# Patient Record
Sex: Male | Born: 1970 | Hispanic: Refuse to answer | Marital: Married
Health system: Midwestern US, Community
[De-identification: ages and names within clinical notes are randomized; demographics above are authoritative.]

## PROBLEM LIST (undated history)

## (undated) DIAGNOSIS — K921 Melena: Secondary | ICD-10-CM

## (undated) DIAGNOSIS — M1990 Unspecified osteoarthritis, unspecified site: Secondary | ICD-10-CM

## (undated) DIAGNOSIS — G8929 Other chronic pain: Secondary | ICD-10-CM

## (undated) DIAGNOSIS — M25572 Pain in left ankle and joints of left foot: Secondary | ICD-10-CM

## (undated) DIAGNOSIS — S134XXA Sprain of ligaments of cervical spine, initial encounter: Secondary | ICD-10-CM

## (undated) DIAGNOSIS — F17200 Nicotine dependence, unspecified, uncomplicated: Secondary | ICD-10-CM

## (undated) DIAGNOSIS — M19072 Primary osteoarthritis, left ankle and foot: Secondary | ICD-10-CM

## (undated) DIAGNOSIS — Z5181 Encounter for therapeutic drug level monitoring: Secondary | ICD-10-CM

## (undated) DIAGNOSIS — F17208 Nicotine dependence, unspecified, with other nicotine-induced disorders: Secondary | ICD-10-CM

## (undated) DIAGNOSIS — Z4789 Encounter for other orthopedic aftercare: Secondary | ICD-10-CM

## (undated) HISTORY — DX: Unspecified osteoarthritis, unspecified site: M19.90

## (undated) HISTORY — DX: Melena: K92.1

---

## 2005-03-04 ENCOUNTER — Encounter: Payer: Self-pay | Admitting: Family Medicine

## 2005-06-15 ENCOUNTER — Encounter: Admission: RE | Admit: 2005-06-15 | Discharge: 2005-06-15 | Payer: Self-pay | Admitting: Rheumatology

## 2005-06-18 ENCOUNTER — Emergency Department (HOSPITAL_COMMUNITY): Admission: EM | Admit: 2005-06-18 | Discharge: 2005-06-18 | Payer: Self-pay | Admitting: *Deleted

## 2006-08-08 ENCOUNTER — Emergency Department (HOSPITAL_COMMUNITY): Admission: EM | Admit: 2006-08-08 | Discharge: 2006-08-09 | Payer: Self-pay | Admitting: Emergency Medicine

## 2007-01-27 ENCOUNTER — Ambulatory Visit: Payer: Self-pay | Admitting: Family Medicine

## 2007-03-22 ENCOUNTER — Ambulatory Visit: Payer: Self-pay | Admitting: Family Medicine

## 2007-03-22 DIAGNOSIS — M67919 Unspecified disorder of synovium and tendon, unspecified shoulder: Secondary | ICD-10-CM | POA: Insufficient documentation

## 2007-03-22 DIAGNOSIS — M719 Bursopathy, unspecified: Secondary | ICD-10-CM

## 2007-04-17 ENCOUNTER — Emergency Department (HOSPITAL_COMMUNITY): Admission: EM | Admit: 2007-04-17 | Discharge: 2007-04-17 | Payer: Self-pay | Admitting: Family Medicine

## 2007-04-19 ENCOUNTER — Ambulatory Visit: Payer: Self-pay | Admitting: Family Medicine

## 2007-04-19 DIAGNOSIS — H6120 Impacted cerumen, unspecified ear: Secondary | ICD-10-CM | POA: Insufficient documentation

## 2007-04-19 DIAGNOSIS — J020 Streptococcal pharyngitis: Secondary | ICD-10-CM | POA: Insufficient documentation

## 2007-06-06 ENCOUNTER — Telehealth: Payer: Self-pay | Admitting: Family Medicine

## 2007-08-22 ENCOUNTER — Encounter: Payer: Self-pay | Admitting: Family Medicine

## 2007-09-26 ENCOUNTER — Telehealth: Payer: Self-pay | Admitting: Family Medicine

## 2007-10-03 ENCOUNTER — Encounter: Payer: Self-pay | Admitting: Family Medicine

## 2007-10-22 ENCOUNTER — Encounter: Payer: Self-pay | Admitting: Family Medicine

## 2007-10-22 ENCOUNTER — Encounter: Payer: Self-pay | Admitting: Emergency Medicine

## 2007-10-22 ENCOUNTER — Inpatient Hospital Stay (HOSPITAL_COMMUNITY): Admission: EM | Admit: 2007-10-22 | Discharge: 2007-10-26 | Payer: Self-pay | Admitting: Emergency Medicine

## 2007-10-30 ENCOUNTER — Ambulatory Visit: Payer: Self-pay | Admitting: Family Medicine

## 2007-10-30 DIAGNOSIS — A879 Viral meningitis, unspecified: Secondary | ICD-10-CM | POA: Insufficient documentation

## 2007-10-30 DIAGNOSIS — D72829 Elevated white blood cell count, unspecified: Secondary | ICD-10-CM | POA: Insufficient documentation

## 2007-10-30 DIAGNOSIS — R740 Nonspecific elevation of levels of transaminase and lactic acid dehydrogenase [LDH]: Secondary | ICD-10-CM

## 2007-11-01 LAB — CONVERTED CEMR LAB
Alkaline Phosphatase: 59 units/L (ref 39–117)
Basophils Relative: 3 % (ref 0.0–3.0)
Bilirubin, Direct: 0.2 mg/dL (ref 0.0–0.3)
Eosinophils Relative: 1.6 % (ref 0.0–5.0)
Monocytes Absolute: 0.4 10*3/uL (ref 0.1–1.0)
RBC: 4.93 M/uL (ref 4.22–5.81)
Total Bilirubin: 1.2 mg/dL (ref 0.3–1.2)
Total Protein: 7.6 g/dL (ref 6.0–8.3)
WBC: 5.4 10*3/uL (ref 4.5–10.5)

## 2008-05-30 ENCOUNTER — Ambulatory Visit: Payer: Self-pay | Admitting: Family Medicine

## 2008-05-30 DIAGNOSIS — B350 Tinea barbae and tinea capitis: Secondary | ICD-10-CM

## 2008-08-28 ENCOUNTER — Telehealth: Payer: Self-pay | Admitting: Family Medicine

## 2008-09-10 ENCOUNTER — Encounter: Payer: Self-pay | Admitting: Family Medicine

## 2008-10-03 ENCOUNTER — Encounter: Payer: Self-pay | Admitting: Family Medicine

## 2008-11-25 ENCOUNTER — Ambulatory Visit: Payer: Self-pay | Admitting: Family Medicine

## 2008-11-25 DIAGNOSIS — R5383 Other fatigue: Secondary | ICD-10-CM

## 2008-11-25 DIAGNOSIS — R5381 Other malaise: Secondary | ICD-10-CM

## 2008-11-26 LAB — CONVERTED CEMR LAB
ALT: 75 units/L — ABNORMAL HIGH (ref 0–53)
BUN: 11 mg/dL (ref 6–23)
Basophils Relative: 0.7 % (ref 0.0–3.0)
Bilirubin, Direct: 0 mg/dL (ref 0.0–0.3)
CO2: 29 meq/L (ref 19–32)
Calcium: 8.6 mg/dL (ref 8.4–10.5)
Direct LDL: 140.4 mg/dL
Eosinophils Absolute: 0 10*3/uL (ref 0.0–0.7)
GFR calc non Af Amer: 87.82 mL/min (ref >60)
Hemoglobin: 13.2 g/dL (ref 13.0–17.0)
Lymphocytes Relative: 28.6 % (ref 12.0–46.0)
Lymphs Abs: 1.3 10*3/uL (ref 0.7–4.0)
MCHC: 33 g/dL (ref 30.0–36.0)
MCV: 85.2 fL (ref 78.0–100.0)
Monocytes Absolute: 0.5 10*3/uL (ref 0.1–1.0)
Monocytes Relative: 10.6 % (ref 3.0–12.0)
Neutrophils Relative %: 59.3 % (ref 43.0–77.0)
Platelets: 210 10*3/uL (ref 150.0–400.0)
RDW: 14.1 % (ref 11.5–14.6)
Sodium: 140 meq/L (ref 135–145)
Total Bilirubin: 0.7 mg/dL (ref 0.3–1.2)
Total CHOL/HDL Ratio: 4
WBC: 4.4 10*3/uL — ABNORMAL LOW (ref 4.5–10.5)

## 2008-12-17 ENCOUNTER — Ambulatory Visit: Payer: Self-pay | Admitting: Family Medicine

## 2009-01-15 ENCOUNTER — Encounter: Payer: Self-pay | Admitting: Family Medicine

## 2009-01-15 ENCOUNTER — Encounter: Admission: RE | Admit: 2009-01-15 | Discharge: 2009-01-15 | Payer: Self-pay | Admitting: Family Medicine

## 2009-04-14 ENCOUNTER — Emergency Department (HOSPITAL_COMMUNITY): Admission: EM | Admit: 2009-04-14 | Discharge: 2009-04-15 | Payer: Self-pay | Admitting: Emergency Medicine

## 2009-04-15 ENCOUNTER — Emergency Department (HOSPITAL_COMMUNITY): Admission: EM | Admit: 2009-04-15 | Discharge: 2009-04-15 | Payer: Self-pay | Admitting: Emergency Medicine

## 2009-10-17 IMAGING — RF DG FLUORO GUIDE NDL PLC/BX
1 series · 1 of 1 positions shown · non-contrast
Comparison: none

CLINICAL DATA: Headache.

DIAGNOSTIC LUMBAR PUNCTURE UNDER FLUOROSCOPIC GUIDANCE
TECHNIQUE: Informed consent was obtained from the patient prior to
the procedure, including potential complications of headache,
allergy, and pain.   With the patient prone, the lower back was
prepped with Betadine.  1% Lidocaine was used for local anesthesia.
Lumbar puncture was performed at the L4-5 level using a 20 gauge
needle with return of clear CSF with an opening pressure of 19 cm
water.   6-8 cc of CSF were obtained for laboratory studies.  The
patient tolerated the procedure well and there were no apparent
complications.

[Series 1: run · 1 of 1 slices shown]
[im 1/1]
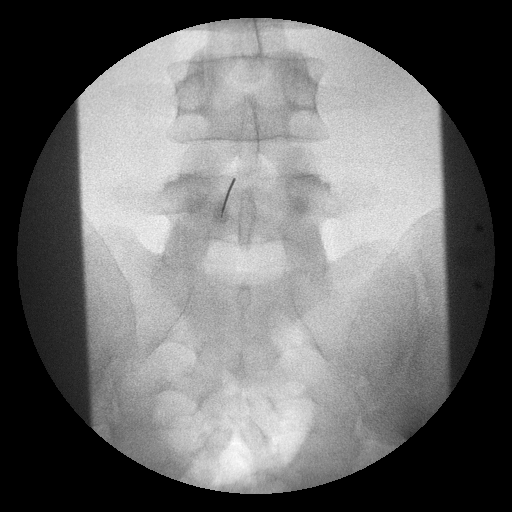

[1 of 1 positions shown; findings below may reference images not displayed]

IMPRESSION: Successful lumbar puncture under fluoroscopic guidance as above.

## 2009-10-17 IMAGING — CT CT HEAD W/O CM
1 series · 16 of 30 positions shown, 20 images · non-contrast
Comparison: None

CLINICAL DATA: Headache.

CT HEAD WITHOUT CONTRAST
TECHNIQUE: Contiguous axial images were obtained from the base of
the skull through the vertex without contrast.

[Series 2: headseq 4.8 h45s · axial · 0.43mm/px · z∈[-198,-46]mm · 16 of 36 slices shown, 20 images]
[im 2/36  brain]
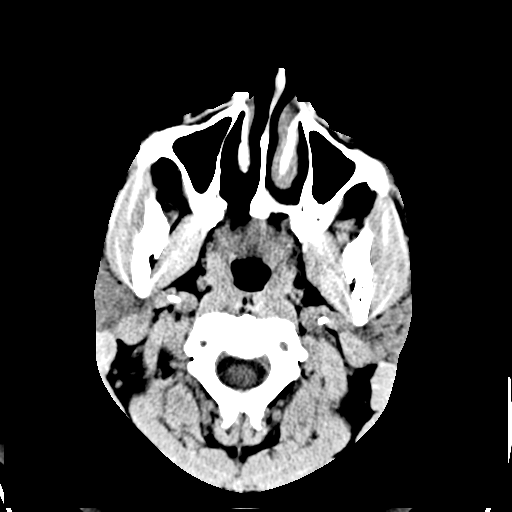
[im 2/36  bone]
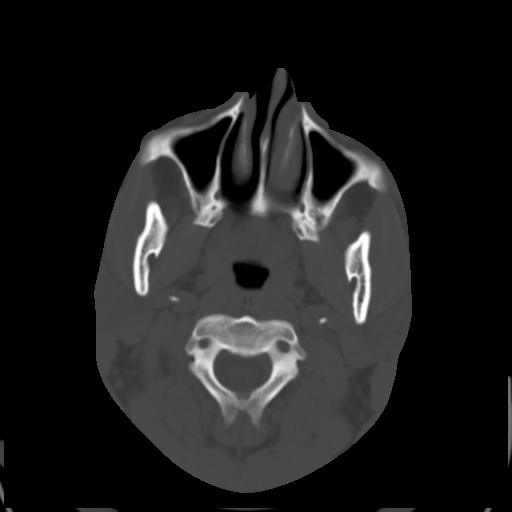
[im 4/36  brain]
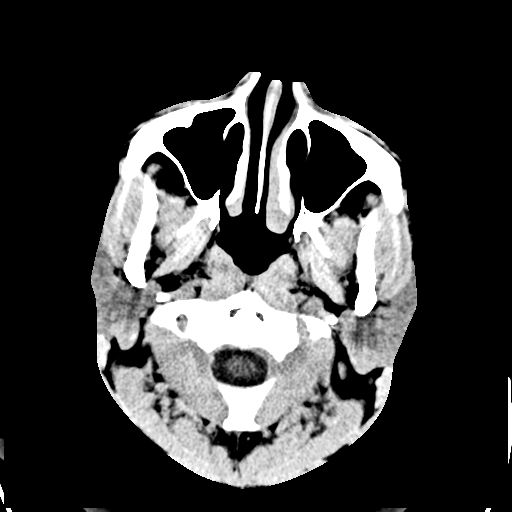
[im 7/36  brain]
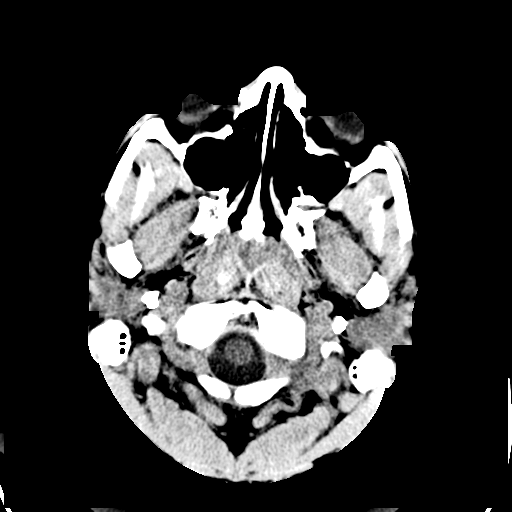
[im 9/36  brain]
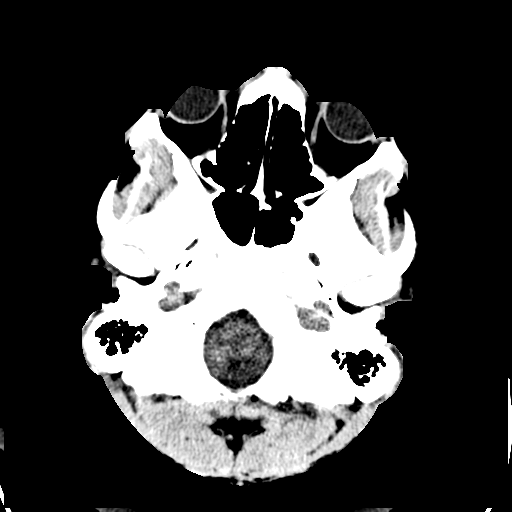
[im 10/36  brain]
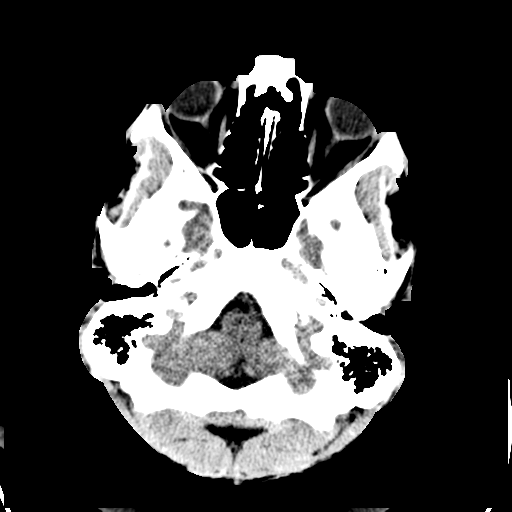
[im 10/36  bone]
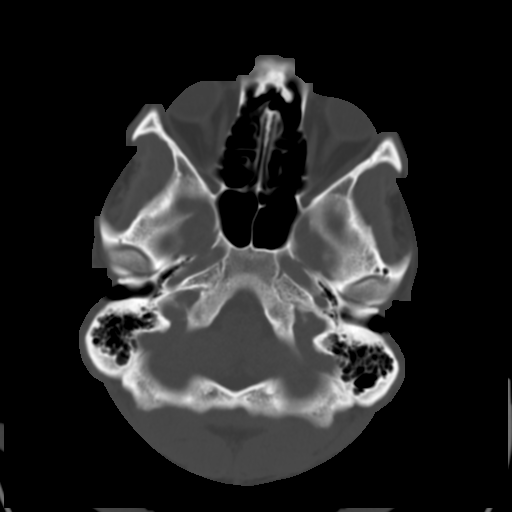
[im 13/36  brain]
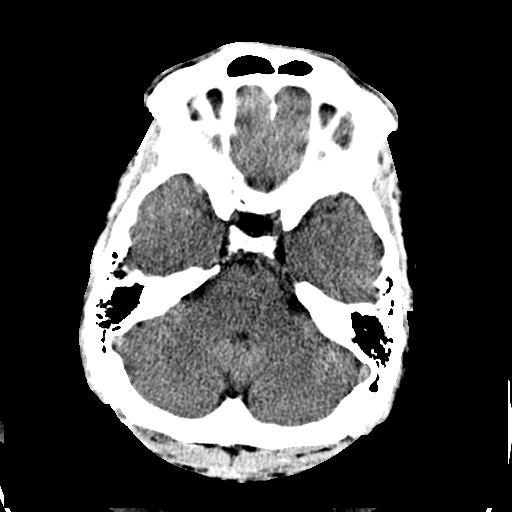
[im 15/36  brain]
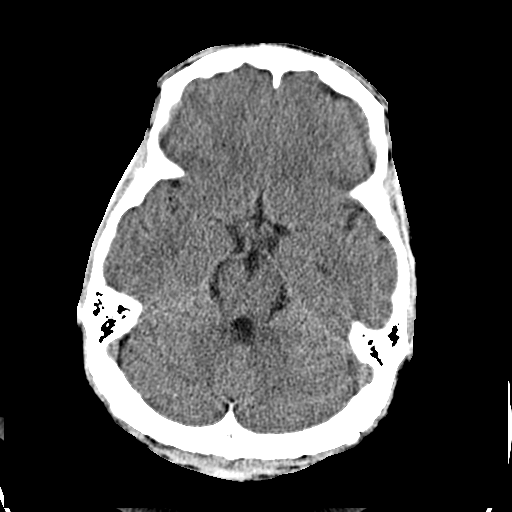
[im 17/36  brain]
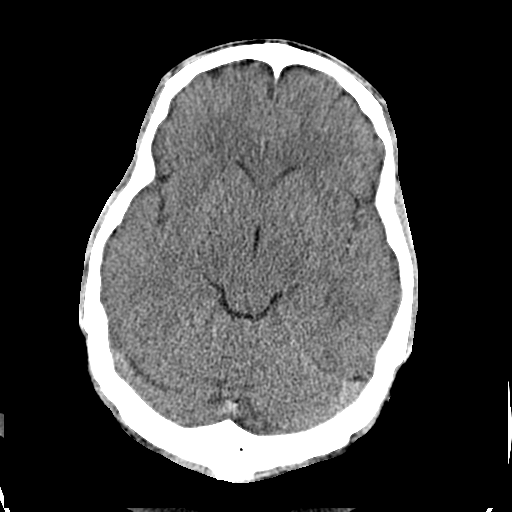
[im 19/36  brain]
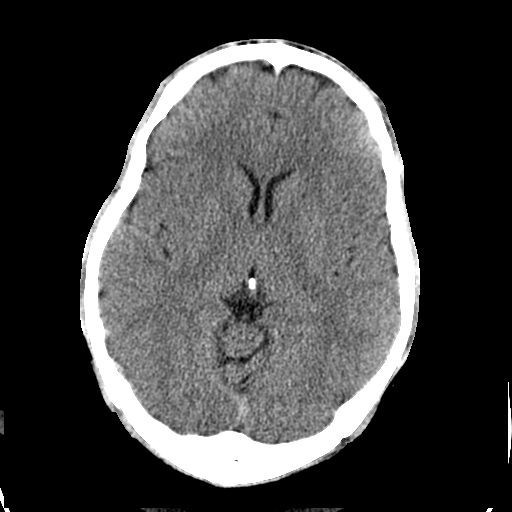
[im 19/36  bone]
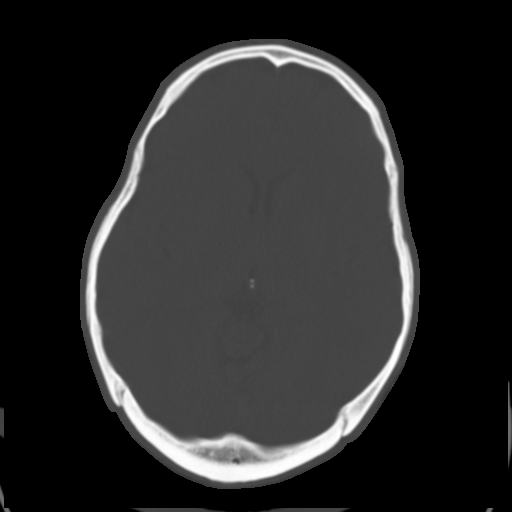
[im 21/36  brain]
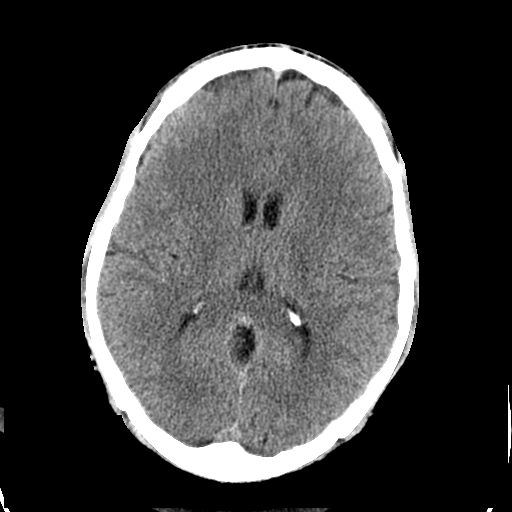
[im 23/36  brain]
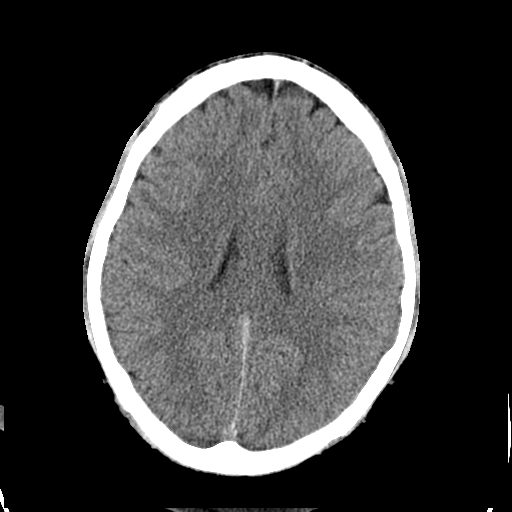
[im 26/36  brain]
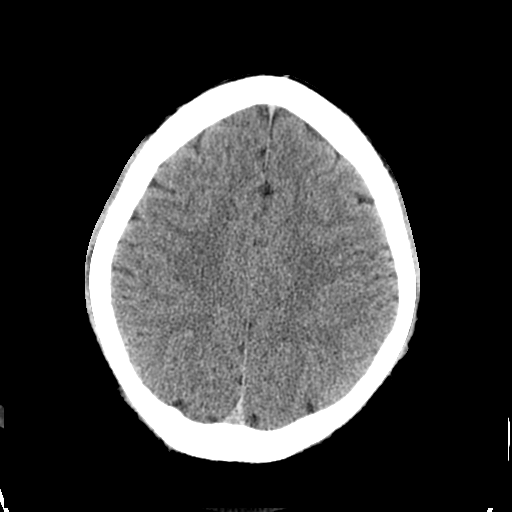
[im 27/36  brain]
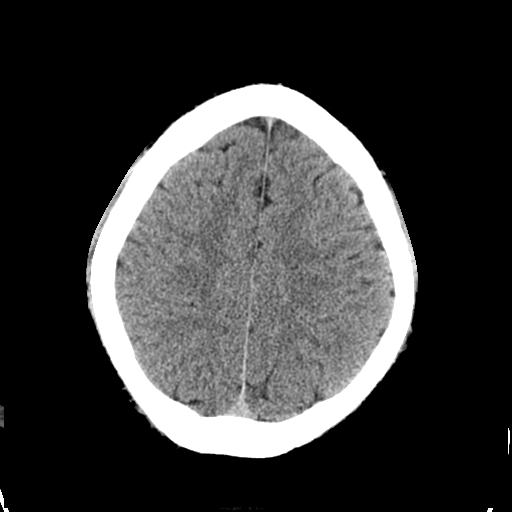
[im 27/36  bone]
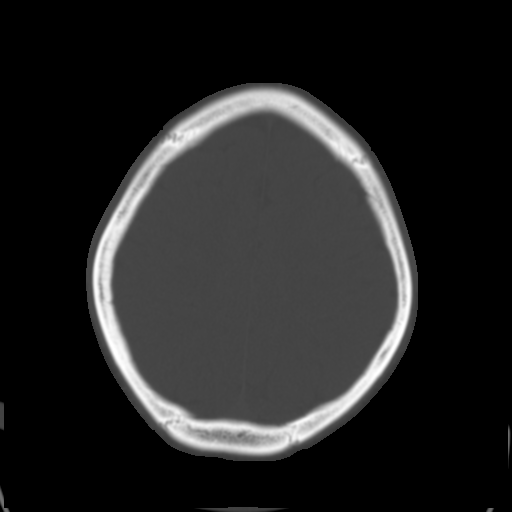
[im 29/36  brain]
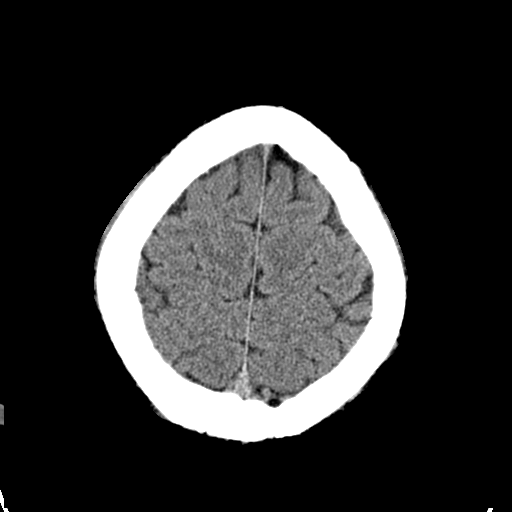
[im 32/36  brain]
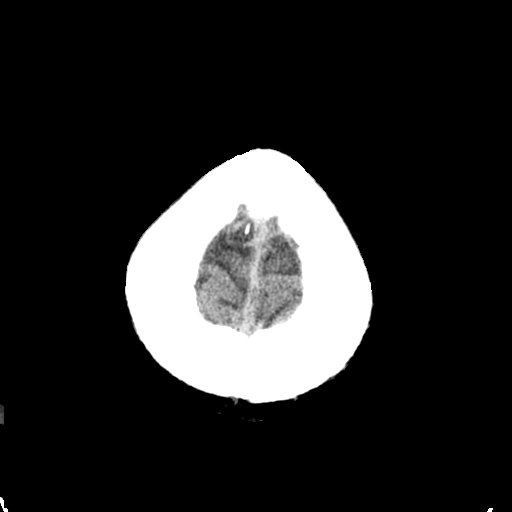
[im 34/36  brain]
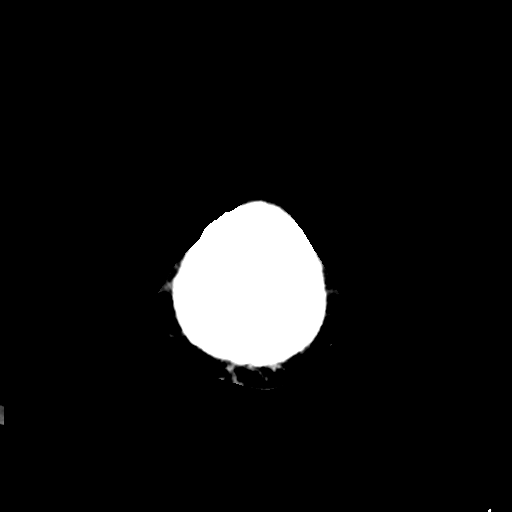

[16 of 30 positions shown; findings below may reference images not displayed]

FINDINGS: No acute intracranial abnormality.  Specifically, no
hemorrhage, hydrocephalus, mass lesion, acute infarction, or
significant intracranial injury.  No acute calvarial abnormality.
IMPRESSION: No acute intracranial findings.

## 2010-03-08 ENCOUNTER — Encounter: Payer: Self-pay | Admitting: Rheumatology

## 2010-06-30 NOTE — H&P (Signed)
Joseph Becker, Joseph Becker                ACCOUNT NO.:  [id number]   MEDICAL RECORD NO.:  [id number]          PATIENT TYPE:  OUT   LOCATION:  [facility]                         FACILITY:  [facility]   PHYSICIAN:  Beckey Rutter, MD  DATE OF BIRTH:  [birth year]   DATE OF ADMISSION:  10/22/2007  DATE OF DISCHARGE:                              HISTORY & PHYSICAL   PRIMARY CARE PHYSICIAN:  This patient is unassigned to [facility].   CHIEF COMPLAINT:  Headache, photophobia.   HISTORY OF PRESENT ILLNESS:  This is a 40 year old African American  gentleman with past medical history significant for tobacco abuse.  Presented yesterday with headache.  Severe headache that started the  night before his presentation.  The headache was associated with chills  but no fever recorded.  The headache was also associated with  photophobia.  The headache was progressively worsening, which brought  the patient to the emergency department.  Currently the headache is  relieved by pain medication in the emergency department.  The patient  denied any sick contact or nausea or vomiting.   The patient was also complaining of reddish stool and some abdominal  discomfort on presentation.  He personally felt that this is secondary  to [employer] he ate outside in the restaurant on the night prior  to his presentation.  He stated that his 38-year-old daughter has similar  symptoms and his son was complaining of some flu-like symptoms a few  days ago.   REVIEW OF SYSTEMS:  Positive for chills, blood per rectum, and abdominal  discomfort.  No vomiting.   FAMILY HISTORY:  Is significant for diabetes in the mother's side.   SOCIAL HISTORY:  The patient is a smoker about 1 back per day for the  last 10 years.  He is also drinking 2 beers every night.  He denies he  denies drug abuse.   MEDICATION ALLERGIES:  Not known to have medication allergy.   MEDICATIONS:  None.   EXAMINATION:  VITAL SIGNS:  His temperature on  presentation is 100.0,  pulse 101, respiratory rate is 18.  The last documented temperature is  98.3 with pulse 87, respiratory rate is 20.  GI:  He was lying flat on bed covering his eyes with lights in the room.  HEAD:  Atraumatic, normocephalic.  EYES:  The patient has bulging eyes/exophthalmus bilaterally, which he  said is new to him.  His pupils are equal, reactive to light  bilaterally.  NECK:  He has positive neck rigidity.  BACK:  He has a straight back with no significant area of tenderness.  LUNGS:  Bilateral fair air entry.  No adventitious sounds.  PRECORDIAL EXAMINATION:  First and second heart sound audible.  No added  sounds.  ABDOMEN:  Soft, nontender.  Bowel sounds present.  EXTREMITIES:  No lower extremity edema.  The patient has slight pain  with straight leg raising sign.  NEUROLOGICALLY:  He is alert, orientated, giving history.  MOUTH:  No evidence of ulcer.   LABORATORY STUDIES:  CT head without contrast done on the October 22, 2007 today  showing no acute intracranial abnormalities.  The patient had  undergone LP to rule out meningitis.  Glucose is 60 with the CSF protein  86.  Gram stain is negative for organism with few white blood count  present, predominantly mononuclear.  The cell count and differential  were showing white blood count of 66, RBCs 38, lymphocytes 83, with  segmented neutrophils for the CSF.  Color is colorless and it is clear.  Lipase in the blood is 21, occult blood is negative.  Urinalysis is  essentially negative with clear urine.  Sodium is 137, potassium 3.8,  chloride 105, bicarb 24, glucose 118, BUN 17, creatinine 1.1.  White  blood count is 6.2, hemoglobin is 15, hematocrit 45.8 and platelet count  is 205,000.   ASSESSMENT/PLAN:  This is a 40 year old gentleman with viral meningitis.   PLAN:  1. The patient will be admitted to the hospital for further assessment      and management.  2. The patient will have supportive care  and pain control measures.  3. Will start the patient on acyclovir and we will send for herpes      type 1 and 2 and adenovirus pending the infectious disease      consultation.  4. The patient will be on bed rest since he is status post LP.  5. For DVT prophylaxis will start Lovenox.  6. For GI prophylaxis will consider Protonix.      Beckey Rutter, MD  Electronically Signed     EME/MEDQ  D:  10/22/2007  T:  10/22/2007  Job:  [id number]

## 2010-06-30 NOTE — Assessment & Plan Note (Signed)
[facility]                                 ON-CALL NOTE   NAME:LEESiddhartha, Hoback                         MRN:          [id number]  DATE:03/22/2007                            DOB:          1970-04-22    Kerby Nora, M.D. patient.   PHONE NUMBER:  [phone]   The patient was just put on Celebrex for joint pain and is having nausea  and diarrhea, wonders if it could be related to the medication after the  first day.  I told him it is not clear if that is it or not, but I would  stop the medicine and call the office for further direction whether he  should re-challenge or not, or other advise.     Neta Mends. Panosh, MD  Electronically Signed    WKP/MedQ  DD: 03/22/2007  DT: 03/23/2007  Job #: [id number]

## 2010-06-30 NOTE — Discharge Summary (Signed)
Joseph Becker, Joseph Becker                ACCOUNT NO.:  [id number]   MEDICAL RECORD NO.:  [id number]          PATIENT TYPE:  OUT   LOCATION:  [facility]                         FACILITY:  [facility]   PHYSICIAN:  Elliot Cousin, M.D.    DATE OF BIRTH:  1970/06/11   DATE OF ADMISSION:  10/22/2007  DATE OF DISCHARGE:  10/22/2007                               DISCHARGE SUMMARY   PRIMARY CARE PHYSICIAN:  Dr. Waymon [facility]   DISCHARGE DIAGNOSES:  1. Aseptic meningitis.  2. Hepatic transaminitis.  3. Mild leukopenia thought to be secondary to acyclovir.   DISCHARGE MEDICATIONS:  1. Vicodin 5 mg every 6 hours as needed for pain.  2. Tramadol one tablet every 4-6 hours as needed for pain (do not take      with Vicodin).   DISCHARGE DISPOSITION:  The patient is being discharged to home in  improved and stable condition.  The plan is for him to follow up with  Dr. Daron Becker  in 4-5 days.   CONSULTATIONS:  A curbside consultation with infectious diseases  physician Dr. Maurice March.   PROCEDURE PERFORMED:  1. Fluoroscopic guided lumbar puncture on October 22, 2007.  2. CT scan of the head on October 22, 2007.  The results revealed no      acute intracranial findings.   HISTORY OF PRESENT ILLNESS:  The patient is a 40 year old man with no  significant past medical history, who presented to the emergency  department on October 22, 2007, with a chief complaint of headache and  photophobia.  When the patient was evaluated in the emergency  department, a CT scan of the head was performed, and it revealed no  acute abnormalities.  A lumbar puncture was performed as well and the  results were as follows:  CSF glucose 60, CSF protein 86 (15-45 within  normal limits), CSF Gram stain with no organisms seen and few WBCs  present, CSF white blood cell count 78, RBC count 22, segmented  neutrophils 3%, lymphocytes 86%, and monocytes 11%.  The patient was  admitted for further evaluation  and management.   For additional details, please see the dictated history and physical.   HOSPITAL COURSE:  1. ASEPTIC MENINGITIS.  Blood cultures were ordered as well as a      culture of the CSF.  Herpes serology for type 1 and type 2 were      ordered as well.  The patient was started on empiric treatment with      intravenous acyclovir at 10 mg/kg every 8 hours.  The patient was      treated symptomatically with as-needed Tylenol, oxycodone and      Dilaudid.  At the time of the initial hospital assessment, the      patient was febrile with a temperature of 100.0.  During the      hospitalization, he became and remained afebrile.  He is currently      symptomatically improved with less photophobia and a less intense      headache.  He has been neurologically intact during  the entire      hospitalization.  There have been no signs or symptoms consistent      with encephalitis.  His blood cultures have remained negative.  His      CSF culture has been negative as well.  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  Yesterday, a CSF specimen for HSV-1 and HSV-      2 testing was ordered.  However, the results are pending.  I      discussed  further treatment with infectious diseases physician Dr.      Maurice March via a telephone conversation.  Given that the patient is now      symptomatically improved, and there is no objective evidence of a      CNS HSV infection, Dr. Maurice March recommended discontinuing the      acyclovir.  The patient did receive 4 days of intravenous      acyclovir.  He was advised to follow up with his primary care      physician next week.  He was also advised to come back to the      hospital if his symptoms worsened.  2. HEPATIC TRANSAMINITIS.  The patient's liver transaminases were      within normal limits at the time of the initial hospital      assessment.  He did complain of abdominal pain at the time of      admission which he attributed to food he ate at  a [employer].      During the hospitalization, however, he had no complaints of      abdominal pain.  His lipase was 21 and his stool heme negative.Marland Kitchen      His urinalysis revealed no WBCs.  As of today, his liver      transaminases have increased; his SGOT is 118 and his SGPT is 103.      On exam today, the patient has no abdominal tenderness.  As      previously stated, his liver transaminases were within normal      limits until today's lab results.  The etiology of the      transaminitis is unclear.  Perhaps, it is a manifestation of      acyclovir's  effects on the liver.  However, this cannot be      confirmed.  The patient was notified of the findings and was      advised to ask his primary care physician to follow up on the      abnormalities next week.  The patient voiced understanding.  3. MILD LEUKOPENIA.  The patient's white blood cell count was 6.2 at      the time of the initial hospital assessment.  Today, his WBC is      3.9.  More than likely, the leukopenia is secondary to acyclovir. I      advised the patient to have a follow-up CBC evaluated by his      primary care physician next week. All instructions were written and      given to the patient.      Elliot Cousin, M.D.  Electronically Signed     DF/MEDQ  D:  10/26/2007  T:  10/26/2007  Job:  [id number]   cc:   [facility] Dr. Daron Becker

## 2010-11-09 LAB — POCT RAPID STREP A: Streptococcus, Group A Screen (Direct): POSITIVE — AB

## 2010-11-18 LAB — CBC
Hemoglobin: 15
MCHC: 32.7
MCV: 83.5
RBC: 4.53
RBC: 4.53
RBC: 5.53
RDW: 15.3
WBC: 3.9 — ABNORMAL LOW
WBC: 6.2

## 2010-11-18 LAB — CULTURE, BLOOD (ROUTINE X 2): Culture: NO GROWTH

## 2010-11-18 LAB — COMPREHENSIVE METABOLIC PANEL
ALT: 103 — ABNORMAL HIGH
ALT: 48
AST: 118 — ABNORMAL HIGH
AST: 19
AST: 30
Albumin: 4.4
Alkaline Phosphatase: 51
BUN: 17
BUN: 8
CO2: 24
CO2: 26
CO2: 32
Calcium: 8.4
Calcium: 9.2
Chloride: 105
Creatinine, Ser: 1.29
GFR calc Af Amer: >60
GFR calc Af Amer: >60
GFR calc Af Amer: >60
GFR calc non Af Amer: >60
GFR calc non Af Amer: >60
Glucose, Bld: 118 — ABNORMAL HIGH
Glucose, Bld: 122 — ABNORMAL HIGH
Potassium: 4.1
Sodium: 140
Total Bilirubin: 0.9
Total Protein: 7.6

## 2010-11-18 LAB — CSF CELL COUNT WITH DIFFERENTIAL
Eosinophils, CSF: 0
Lymphs, CSF: 86 — ABNORMAL HIGH
Monocyte-Macrophage-Spinal Fluid: 11 — ABNORMAL LOW
Segmented Neutrophils-CSF: 3
Tube #: 4

## 2010-11-18 LAB — URINALYSIS, ROUTINE W REFLEX MICROSCOPIC
Hgb urine dipstick: NEGATIVE
Nitrite: NEGATIVE
Protein, ur: NEGATIVE
Urobilinogen, UA: 1

## 2010-11-18 LAB — MAGNESIUM: Magnesium: 2.1

## 2010-11-18 LAB — TSH: TSH: 1.27

## 2010-11-18 LAB — DIFFERENTIAL
Basophils Absolute: 0
Basophils Relative: 0
Eosinophils Absolute: 0
Lymphs Abs: 1
Monocytes Relative: 6

## 2010-11-18 LAB — LIPASE, BLOOD: Lipase: 21

## 2010-11-18 LAB — CSF CULTURE W GRAM STAIN

## 2010-11-18 LAB — PROTEIN, CSF: Total  Protein, CSF: 86 — ABNORMAL HIGH

## 2010-11-18 LAB — PHOSPHORUS: Phosphorus: 4.2

## 2013-05-28 ENCOUNTER — Telehealth: Payer: Self-pay

## 2013-05-28 NOTE — Telephone Encounter (Signed)
Pt request letter from Dr Ermalene Searing that when pt was seen 03/22/2007 and during 2010 the right shoulder pain was service connected. Pt request cb. I advised pt needed to be seen since so long ago and pt said he has been going to [place] and the [place] is the facility that needs the letter.Please advise.

## 2013-05-29 ENCOUNTER — Encounter: Payer: Self-pay | Admitting: Family Medicine

## 2013-05-29 DIAGNOSIS — Z0279 Encounter for issue of other medical certificate: Secondary | ICD-10-CM

## 2013-05-29 NOTE — Telephone Encounter (Signed)
I don't mind writing this letter if I have documented what he wants in the chart, but can we get his old chart with the note? Does not appear to be scanned in.

## 2013-05-29 NOTE — Telephone Encounter (Signed)
Spoke with Mr. Joseph Becker.  He states he needs a letter about his hand and shoulder pain that he was seen for back in 2009/2010.  He is trying to show service connection.  Advise we would work on a letter for him and call him when it is ready.

## 2013-05-29 NOTE — Telephone Encounter (Signed)
Let handed to Lupita Leash.

## 2013-05-30 NOTE — Telephone Encounter (Signed)
Joseph Becker notified letter is ready to be picked up at front desk.  Advised we are including records also to take with him to the [place].  Also notified that there is a $20 fee but they would just bill him for that.

## 2013-06-12 ENCOUNTER — Encounter: Payer: Self-pay | Admitting: Family Medicine

## 2013-06-12 ENCOUNTER — Ambulatory Visit (INDEPENDENT_AMBULATORY_CARE_PROVIDER_SITE_OTHER): Payer: Self-pay | Admitting: Family Medicine

## 2013-06-12 ENCOUNTER — Telehealth: Payer: Self-pay | Admitting: Family Medicine

## 2013-06-12 VITALS — BP 114/90 | HR 77 | Temp 98.1°F | Ht 66.77 in | Wt 182.8 lb

## 2013-06-12 DIAGNOSIS — F1010 Alcohol abuse, uncomplicated: Secondary | ICD-10-CM | POA: Insufficient documentation

## 2013-06-12 DIAGNOSIS — M25511 Pain in right shoulder: Secondary | ICD-10-CM | POA: Insufficient documentation

## 2013-06-12 DIAGNOSIS — M069 Rheumatoid arthritis, unspecified: Secondary | ICD-10-CM | POA: Insufficient documentation

## 2013-06-12 DIAGNOSIS — M25512 Pain in left shoulder: Secondary | ICD-10-CM

## 2013-06-12 DIAGNOSIS — M542 Cervicalgia: Secondary | ICD-10-CM

## 2013-06-12 DIAGNOSIS — M25519 Pain in unspecified shoulder: Secondary | ICD-10-CM

## 2013-06-12 DIAGNOSIS — I10 Essential (primary) hypertension: Secondary | ICD-10-CM

## 2013-06-12 NOTE — Progress Notes (Signed)
Pre visit review using our clinic review tool, if applicable. No additional management support is needed unless otherwise documented below in the visit note. 

## 2013-06-12 NOTE — Telephone Encounter (Signed)
Relevant patient education mailed to patient.  

## 2013-06-12 NOTE — Patient Instructions (Addendum)
Decrease alcohol use to no more than 2 drinks a day.  Consider quitting smoking.  I will complete form as I get records from [place].

## 2013-06-12 NOTE — Progress Notes (Signed)
Subjective:    Patient ID: Joseph Becker, male    DOB: 10-Oct-1970, 43 y.o.   MRN: [id number]  HPI 43 year old male presents to re-establish care here.  Last seen here in 2010.  He also is seen at [facility] clinic, started there in last 3 years.   He has rheumatoid arthritis. Rheum at [facility] is Dr. Josetta Huddle. Poor control on methotrexate and mobic.  Pain at night and insomnia from RA. B shoulder pain,  Knee, ankle, hands and neck pain.  He has intermittent swelling, no redness.  He was diagnosed with RA at [facility] in 2013.  Has had multiple X-ray and MRI of joints, negative per pt. No weakness or numbness. Unable to cut grass, make the bed, showering without pain. Pain with ambulating in left ankle.  Has had cortisone injections  in both shoulder ( no relief) 2 years ago.   He needs a form [employer]) complete to state that he has had problems going on for a long time.   Hypertension: Well controlled aon lisinopril HCTZ BP Readings from Last 3 Encounters:  06/12/13 114/90  11/25/08 130/80  05/30/08 120/80  Using medication without problems or lightheadedness: None Chest pain with exertion:None Edema:None Short of breath:None Average home BPs:not checking. Other issues:  Chol and DM screen in last year. Plans to have followed at [facility] at [facility].    Review of Systems  Constitutional: Positive for fatigue. Negative for fever and unexpected weight change.  HENT: Negative for congestion, ear pain, postnasal drip, rhinorrhea, sore throat and trouble swallowing.   Eyes: Negative for pain.  Respiratory: Negative for cough, shortness of breath and wheezing.   Cardiovascular: Negative for chest pain, palpitations and leg swelling.  Gastrointestinal: Negative for nausea, abdominal pain, diarrhea, constipation and blood in stool.  Genitourinary: Negative for dysuria, urgency, hematuria, discharge, penile swelling, scrotal swelling, difficulty urinating, penile pain and testicular pain.    Musculoskeletal: Positive for arthralgias, back pain, joint swelling, neck pain and neck stiffness. Negative for myalgias.  Skin: Negative for rash.  Neurological: Negative for syncope, weakness, light-headedness, numbness and headaches.  Psychiatric/Behavioral: Negative for behavioral problems and dysphoric mood. The patient is not nervous/anxious.        Objective:   Physical Exam  Constitutional: Vital signs are normal. He appears well-developed and well-nourished.  HENT:  Head: Normocephalic.  Right Ear: Hearing normal.  Left Ear: Hearing normal.  Nose: Nose normal.  Mouth/Throat: Oropharynx is clear and moist and mucous membranes are normal.  Neck: Trachea normal. Carotid bruit is not present. No mass and no thyromegaly present.  Cardiovascular: Normal rate, regular rhythm and normal pulses.  Exam reveals no gallop, no distant heart sounds and no friction rub.   No murmur heard. No peripheral edema  Pulmonary/Chest: Effort normal and breath sounds normal. No respiratory distress.  Musculoskeletal:       Right shoulder: He exhibits decreased range of motion, tenderness and bony tenderness.       Left shoulder: He exhibits decreased range of motion, tenderness, bony tenderness, swelling and deformity. He exhibits no effusion.       Left knee: He exhibits decreased range of motion. He exhibits normal meniscus and no MCL laxity. Tenderness found. Medial joint line and lateral joint line tenderness noted. No patellar tendon tenderness noted.       Right ankle: He exhibits decreased range of motion. Tenderness.       Left ankle: He exhibits decreased range of motion. He exhibits  no swelling. Tenderness.       Cervical back: He exhibits decreased range of motion. He exhibits no tenderness and no bony tenderness.       Right hand: He exhibits decreased range of motion, tenderness and swelling. Normal sensation noted. Decreased strength noted. He exhibits finger abduction and thumb/finger  opposition. He exhibits no wrist extension trouble.       Left hand: He exhibits decreased range of motion, tenderness and swelling. He exhibits no bony tenderness. Normal sensation noted. Decreased strength noted. He exhibits finger abduction and thumb/finger opposition. He exhibits no wrist extension trouble.  Skin: Skin is warm, dry and intact. No rash noted.  Psychiatric: He has a normal mood and affect. His speech is normal and behavior is normal. Thought content normal.          Assessment & Plan:  Total visit time 45 minutes, > 50% spent counseling and cordinating patients care.  Discussed joint issues. Form will be completed once [facility] information obtained and past imaging reviewed.   Discussed with pt neck/ shoulder and knee issues seem atypical for RA... He may benefit from referral to [facility], but his rheumatologist Carl Butner already have done or be considering.

## 2013-06-13 ENCOUNTER — Telehealth: Payer: Self-pay | Admitting: Family Medicine

## 2013-06-13 NOTE — Telephone Encounter (Signed)
Relevant patient education mailed to patient.  

## 2013-08-08 ENCOUNTER — Emergency Department (INDEPENDENT_AMBULATORY_CARE_PROVIDER_SITE_OTHER)
Admission: EM | Admit: 2013-08-08 | Discharge: 2013-08-08 | Disposition: A | Discharge status: Home / Self Care | Payer: Self-pay | Source: Home / Self Care | Attending: Emergency Medicine | Admitting: Emergency Medicine

## 2013-08-08 ENCOUNTER — Encounter (HOSPITAL_COMMUNITY): Payer: Self-pay | Admitting: Emergency Medicine

## 2013-08-08 ENCOUNTER — Emergency Department (INDEPENDENT_AMBULATORY_CARE_PROVIDER_SITE_OTHER): Payer: Self-pay

## 2013-08-08 DIAGNOSIS — M722 Plantar fascial fibromatosis: Secondary | ICD-10-CM

## 2013-08-08 MED ORDER — HYDROCODONE-ACETAMINOPHEN 5-325 MG PO TABS
ORAL_TABLET | ORAL | Status: AC
  Filled 2013-08-08: qty 2

## 2013-08-08 MED ORDER — HYDROCODONE-ACETAMINOPHEN 5-325 MG PO TABS
2.0000 | ORAL_TABLET | Freq: Once | ORAL | Status: AC
  Administered 2013-08-08: 2 via ORAL

## 2013-08-08 MED ORDER — HYDROCODONE-ACETAMINOPHEN 5-325 MG PO TABS: ORAL_TABLET | ORAL | Status: AC

## 2013-08-08 NOTE — ED Notes (Signed)
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

## 2013-08-08 NOTE — Discharge Instructions (Signed)
Plantar fasciitis is a tendonitis (inflammed tendon) of the the plantar fascia, the tendon on the bottom of the foot that supports the arch.  Often the pain is localized to the heel and can be worse first thing in the morning after getting up or after sitting for a long period of time and tends to improve as the day goes by, only to worsen later on in the afternoon or evening after you have been on your feet for a long time.  Following the program outlined below cures most cases.  If conservative measures like these don't work, corticosteroid injection, or referral to a podiatrist are other options. ° °· Wear well fitting, lace up shoes with good arch support.  Tennis or running shoes are the best.  Do not wear heels, flip-flops, scuffs, or any kind of shoe without adequate support.   °· Use a heel lift.  These can be purchased at a shoe store or pharmacy.  They come in various price ranges.  Some people find that an orthotic insert with arch support works better. °· Wear a night splint.  These can be purchased on line at www.[employer].com.  The product to look for is the "Freedom Dorsal Night Splint."  [employer] also sells other products for plantar fasciitis including heel lifts and orthotic inserts. °· Use of over the counter pain meds can be of help.  Tylenol (or acetaminophen) is the safest to use.  It often helps to take this regularly.  You can take up to 2 325 mg tablets 5 times daily, but it best to start out much lower that that, perhaps 2 325 mg tablets twice daily, then increase from there. People who are on the blood thinner warfarin have to be careful about taking high doses of Tylenol.  For people who are able to tolerate them, ibuprofen and naproxyn can also help with the pain.  You should discuss these agents with your physician before taking them.  People with chronic kidney disease, hypertension, peptic ulcer disease, and reflux can suffer adverse side effects. They should not be taken with warfarin.  The maximum dosage of ibuprofen is 800 mg 3 times daily with meals.  The maximum dosage of naprosyn is 2 and 1/2 tablets twice daily with food, but again, start out low and gradually increase the dose until adequate pain relief is achieved. Ibuprofen and naprosyn should always be taken with food. °· Ice massage is helpful.  The best way to apply this is to freeze a bottle of water, place in on a towel and roll your foot over the bottle to produce an ice massage effect. This can be done as often as every 2 to 3 hours, depending on symptoms.  At the very least, try to do after you have been on your feet for a long period of time and after doing the exercises outlined below. °· Do the exercises outlined below twice daily: ° ° ° ° ° ° ° ° ° ° ° ° ° ° ° ° ° ° °

## 2013-08-08 NOTE — ED Provider Notes (Signed)
Chief Complaint   Chief Complaint  Patient presents with  . Foot Pain    History of Present Illness   Joseph Becker is a 43 year old male who states his arch fell 10 years ago and had heel pain in his left heel. This nicely got better. Ever since yesterday he's had pain in the heel again with swelling. He denies any injury to the area. It hurts to walk and to move his foot. He denies any numbness or tingling.  Review of Systems   Other than as noted above, the patient denies any of the following symptoms: Systemic:  No fevers or chills. Musculoskeletal:  No joint pain or arthritis.  Neurological:  No muscular weakness, paresthesias.   PMFSH   Past medical history, family history, social history, meds, and allergies were reviewed.     Physical  Examination     Vital signs:  BP 112/66  Pulse 98  Temp(Src) 98.7 F (37.1 C) (Oral)  Resp 14  SpO2 99% Gen:  Alert and oriented times 3.  In no distress. 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  Otherwise, all joints had a full a ROM with no swelling, bruising or deformity.  No edema, pulses full. Extremities were warm and pink.  Capillary refill was brisk.  Skin:  Clear, warm and dry.  No rash. Neuro:  Alert and oriented times 3.  Muscle strength was normal.  Sensation was intact to light touch.    Radiology   Dg Foot Complete Left  08/08/2013   CLINICAL DATA:  Plantar heel pain for 2 days.  EXAM: LEFT FOOT - COMPLETE 3+ VIEW  COMPARISON:  None.  FINDINGS: There is a tiny plantar calcaneal spur seen only on the oblique view. 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 There are slight arthritic changes of the first metatarsal phalangeal joint and of the IP joint of the great toe. There is a small osteophyte at the tip of the medial malleolus and there is an old avulsion of the tip of the lateral malleolus.  IMPRESSION: No acute abnormalities. Flatfoot  deformity. Small plantar calcaneal spur. Degenerative changes as described.   Electronically Signed   By: Geanie Cooley M.D.   On: 08/08/2013 14:17   I reviewed the images independently and personally and concur with the radiologist's findings.  [facility] in [facility]   He was put in a Personal assistant. Given Norco 5/325 2 by mouth for pain.  Assessment   The encounter diagnosis was Plantar fasciitis.  Suggested arch supports, ice massage, rest and elevation, stretching exercises beginning in 3 days, and followup with podiatry next week.  Plan    1.  Meds:  The following meds were prescribed:   Discharge Medication List as of 08/08/2013  2:36 PM    START taking these medications   Details  HYDROcodone-acetaminophen (NORCO/VICODIN) 5-325 MG per tablet 1 to 2 tabs every 4 to 6 hours as needed for pain., Print        2.  Patient Education/Counseling:  The patient was given appropriate handouts, self care instructions, and instructed in symptomatic relief including rest and activity, elevation, application of ice and compression.    3.  Follow up:  The patient was told to follow up here if no better in 3 to 4 days, or sooner if becoming worse in any way, and given some red flag symptoms such as worsening pain or neurological  symptoms which would prompt immediate return.  Follow up here as necessary.       Reuben Likes, MD 08/08/13 [phone]

## 2014-11-15 ENCOUNTER — Emergency Department: Payer: Medicaid Other

## 2014-11-15 ENCOUNTER — Emergency Department
Admission: EM | Admit: 2014-11-15 | Discharge: 2014-11-15 | Disposition: A | Discharge status: Other Acute Inpatient Hospital | Payer: Medicaid Other | Attending: Emergency Medicine | Admitting: Emergency Medicine

## 2014-11-15 DIAGNOSIS — T447X2A Poisoning by beta-adrenoreceptor antagonists, intentional self-harm, initial encounter: Secondary | ICD-10-CM

## 2014-11-15 DIAGNOSIS — F32A Depression, unspecified: Secondary | ICD-10-CM

## 2014-11-15 DIAGNOSIS — Z791 Long term (current) use of non-steroidal anti-inflammatories (NSAID): Secondary | ICD-10-CM | POA: Diagnosis not present

## 2014-11-15 DIAGNOSIS — Y998 Other external cause status: Secondary | ICD-10-CM | POA: Insufficient documentation

## 2014-11-15 DIAGNOSIS — I10 Essential (primary) hypertension: Secondary | ICD-10-CM | POA: Diagnosis not present

## 2014-11-15 DIAGNOSIS — F331 Major depressive disorder, recurrent, moderate: Secondary | ICD-10-CM | POA: Diagnosis not present

## 2014-11-15 DIAGNOSIS — F1310 Sedative, hypnotic or anxiolytic abuse, uncomplicated: Secondary | ICD-10-CM | POA: Diagnosis not present

## 2014-11-15 DIAGNOSIS — Y9389 Activity, other specified: Secondary | ICD-10-CM | POA: Diagnosis not present

## 2014-11-15 DIAGNOSIS — Z79899 Other long term (current) drug therapy: Secondary | ICD-10-CM | POA: Insufficient documentation

## 2014-11-15 DIAGNOSIS — Z720 Tobacco use: Secondary | ICD-10-CM | POA: Insufficient documentation

## 2014-11-15 DIAGNOSIS — F329 Major depressive disorder, single episode, unspecified: Secondary | ICD-10-CM | POA: Insufficient documentation

## 2014-11-15 DIAGNOSIS — T43212A Poisoning by selective serotonin and norepinephrine reuptake inhibitors, intentional self-harm, initial encounter: Secondary | ICD-10-CM | POA: Diagnosis present

## 2014-11-15 DIAGNOSIS — F4310 Post-traumatic stress disorder, unspecified: Secondary | ICD-10-CM

## 2014-11-15 DIAGNOSIS — F1010 Alcohol abuse, uncomplicated: Secondary | ICD-10-CM | POA: Diagnosis present

## 2014-11-15 DIAGNOSIS — Y9289 Other specified places as the place of occurrence of the external cause: Secondary | ICD-10-CM | POA: Diagnosis not present

## 2014-11-15 LAB — CBC
HEMATOCRIT: 45.7 % (ref 40.0–52.0)
HEMOGLOBIN: 14.6 g/dL (ref 13.0–18.0)
MCH: 26.7 pg (ref 26.0–34.0)
MCHC: 32 g/dL (ref 32.0–36.0)
MCV: 83.3 fL (ref 80.0–100.0)
Platelets: 194 10*3/uL (ref 150–440)
RBC: 5.48 MIL/uL (ref 4.40–5.90)
RDW: 15.1 % — ABNORMAL HIGH (ref 11.5–14.5)
WBC: 4.9 10*3/uL (ref 3.8–10.6)

## 2014-11-15 LAB — COMPREHENSIVE METABOLIC PANEL
ALBUMIN: 4.7 g/dL (ref 3.5–5.0)
ALK PHOS: 64 U/L (ref 38–126)
ALT: 79 U/L — AB (ref 17–63)
AST: 51 U/L — AB (ref 15–41)
Anion gap: 9 (ref 5–15)
BUN: 19 mg/dL (ref 6–20)
CALCIUM: 8.8 mg/dL — AB (ref 8.9–10.3)
CHLORIDE: 103 mmol/L (ref 101–111)
CO2: 22 mmol/L (ref 22–32)
CREATININE: 1.08 mg/dL (ref 0.61–1.24)
GFR calc non Af Amer: >60 mL/min (ref >60)
GLUCOSE: 96 mg/dL (ref 65–99)
Potassium: 3.5 mmol/L (ref 3.5–5.1)
SODIUM: 134 mmol/L — AB (ref 135–145)
Total Bilirubin: 1.2 mg/dL (ref 0.3–1.2)
Total Protein: 7.8 g/dL (ref 6.5–8.1)

## 2014-11-15 LAB — SALICYLATE LEVEL

## 2014-11-15 LAB — URINE DRUG SCREEN, QUALITATIVE (ARMC ONLY)
Amphetamines, Ur Screen: NOT DETECTED
BARBITURATES, UR SCREEN: NOT DETECTED
Benzodiazepine, Ur Scrn: NOT DETECTED
CANNABINOID 50 NG, UR ~~LOC~~: NOT DETECTED
COCAINE METABOLITE, UR ~~LOC~~: NOT DETECTED
MDMA (ECSTASY) UR SCREEN: NOT DETECTED
METHADONE SCREEN, URINE: NOT DETECTED
OPIATE, UR SCREEN: NOT DETECTED
Phencyclidine (PCP) Ur S: NOT DETECTED
Tricyclic, Ur Screen: POSITIVE — AB

## 2014-11-15 LAB — ETHANOL: Alcohol, Ethyl (B): 99 mg/dL — ABNORMAL HIGH (ref <5)

## 2014-11-15 LAB — BLOOD GAS, ARTERIAL
ACID-BASE DEFICIT: 2 mmol/L (ref 0.0–2.0)
Bicarbonate: 23.1 mEq/L (ref 21.0–28.0)
FIO2: 0.21
O2 Saturation: 97.2 %
PCO2 ART: 40 mmHg (ref 32.0–48.0)
PH ART: 7.37 (ref 7.350–7.450)
Patient temperature: 37
pO2, Arterial: 95 mmHg (ref 83.0–108.0)

## 2014-11-15 LAB — ACETAMINOPHEN LEVEL: Acetaminophen (Tylenol), Serum: <10 ug/mL — ABNORMAL LOW (ref 10–30)

## 2014-11-15 MED ORDER — SODIUM CHLORIDE 0.9 % IV BOLUS (SEPSIS)
1000.0000 mL | Freq: Once | INTRAVENOUS | Status: AC
  Administered 2014-11-15: 1000 mL via INTRAVENOUS

## 2014-11-15 NOTE — ED Notes (Signed)
lunch provided along with an extra drink  Pt observed with no unusual behavior  Appropriate to stimulation  No verbalized needs or concerns at this time  NAD assessed  Continue to monitor

## 2014-11-15 NOTE — ED Notes (Signed)
I went out to the lobby to locate his wife - I asked the first nurse and pt relations if they have seen her - i was told that she went outside and no one say her at this time   Encouraged them to call me if she returns

## 2014-11-15 NOTE — ED Notes (Signed)
Pt in room pt sleeping at this time. No complaints or concerns voiced at this time. No abnormal behavior noted at this time. Will continue to monitor with q15 min checks. [employer] officer in area.

## 2014-11-15 NOTE — ED Notes (Signed)
Patient assigned to appropriate care area. Patient oriented to unit/care area: Informed that, for their safety, care areas are designed for safety and monitored by security cameras at all times; and visiting hours explained to patient. Patient verbalizes understanding, and verbal contract for safety obtained.  BEHAVIORAL HEALTH ROUNDING Patient sleeping: Yes.   Patient alert and oriented: not applicable Behavior appropriate: Yes.    Nutrition and fluids offered: No Toileting and hygiene offered: No Sitter present: q15 minute observations Law enforcement present: Yes [employer]  ENVIRONMENTAL ASSESSMENT Potentially harmful objects out of patient reach: Yes.   Personal belongings secured: Yes.   Patient dressed in hospital provided attire only: Yes.   Plastic bags out of patient reach: Yes.   Patient care equipment (cords, cables, call bells, lines, and drains) shortened, removed, or accounted for: Yes.   Equipment and supplies removed from bottom of stretcher: Yes.   Potentially toxic materials out of patient reach: Yes.   Sharps container removed or out of patient reach: Yes.

## 2014-11-15 NOTE — ED Notes (Signed)
Received a call from quad rover that this pt has a visitor -his wife - she apparently entered the ED and went to the room where her spouse was last evening  - he was not there so the rover sent her back to the lobby - I then received another call from [employer] police that she smelled of alcohol

## 2014-11-15 NOTE — ED Notes (Signed)
Pt transferred into ED BHU  Room 5   Patient assigned to appropriate care area. Patient oriented to unit/care area: Informed that, for their safety, care areas are designed for safety and monitored by security cameras at all times; Visiting hours and phone times explained to patient. Patient verbalizes understanding, and verbal contract for safety obtained.    Psych consult pending  Assessment completed  He denies pain  "I don't know if I can wait for the doctor cause I got a doctors appointment at 1000."  Reoriented him to the time at present and he verbalized understanding of his plan of care

## 2014-11-15 NOTE — ED Notes (Signed)
ED BHU PLACEMENT JUSTIFICATION Is the patient under IVC or is there intent for IVC: Yes.   Is the patient medically cleared: Yes.   Is there vacancy in the ED BHU: Yes.   Is the population mix appropriate for patient: Yes.   Is the patient awaiting placement in inpatient or outpatient setting Has the patient had a psychiatric consult: consult pending Survey of unit performed for contraband, proper placement and condition of furniture, tampering with fixtures in bathroom, shower, and each patient room: Yes.  ; Findings:  APPEARANCE/BEHAVIOR Calm and cooperative NEURO ASSESSMENT Orientation: oriented x3  Denies pain Hallucinations: No.None noted (Hallucinations) Speech: Normal Gait: normal RESPIRATORY ASSESSMENT even unlabored respirations  CARDIOVASCULAR ASSESSMENT Pulses equal  regular rate  Skin warm and dry GASTROINTESTINAL ASSESSMENT no GI complaint EXTREMITIES Full ROM PLAN OF CARE Provide calm/safe environment. Vital signs assessed twice daily. ED BHU Assessment once each 12-hour shift. Collaborate with intake RN daily or as condition indicates. Assure the ED provider has rounded once each shift. Provide and encourage hygiene. Provide redirection as needed. Assess for escalating behavior; address immediately and inform ED provider.  Assess family dynamic and appropriateness for visitation as needed: Yes.  ; If necessary, describe findings:  Educate the patient/family about BHU procedures/visitation: Yes.  ; If necessary, describe findings:

## 2014-11-15 NOTE — ED Notes (Signed)

## 2014-11-15 NOTE — BHH Counselor (Signed)
Pt difficult to wake; could not assess.

## 2014-11-15 NOTE — ED Notes (Signed)
Pt reports intentionally taking overdose of 4-25 mg elavil at 2130 last night 11/14/14.  Pt reports +SI, but denies HI, AH, VH. Pt does reports drinking 3-22 oz beers per night on a regular basis. Pt denies illicit drug use.  Pt very sleepy during assessment, required to be woke up multiple times to answer questions.

## 2014-11-15 NOTE — ED Notes (Signed)
BEHAVIORAL HEALTH ROUNDING Patient sleeping: No. Patient alert and oriented: yes Behavior appropriate: Yes.  ; If no, describe:  Nutrition and fluids offered: yes Toileting and hygiene offered: Yes  Sitter present: q15 minute observations and security camera monitoring Law enforcement present: Yes  [employer]  

## 2014-11-15 NOTE — ED Notes (Signed)
BEHAVIORAL HEALTH ROUNDING Patient sleeping: Yes.   Patient alert and oriented: not applicable Behavior appropriate: Yes.    Nutrition and fluids offered: No Toileting and hygiene offered: No Sitter present: q15 minute observations Law enforcement present: Yes [employer] 

## 2014-11-15 NOTE — ED Notes (Signed)
Flow Coordinator Leah F, RN out to talk with spouse of pt at spouses request. Spouses wants update on pt's status at this time

## 2014-11-15 NOTE — ED Notes (Signed)
Received a call from a lady stating she is his wife - she was demanding my name and demanding the [employer] officers name from out front - "I want his name cause he made me leave the premises - i am not drunk - i want his name."  Caller informed that I do not know the officers name  -  I could not answer any questions that she has so I transferred the call to the pt     Pt observed with no unusual behavior  Appropriate to stimulation  No verbalized needs or concerns at this time  NAD assessed  Continue to monitor

## 2014-11-15 NOTE — ED Notes (Addendum)
BEHAVIORAL HEALTH ROUNDING Patient sleeping: No. Patient alert and oriented: yes Behavior appropriate: Yes.  ; If no, describe:  Nutrition and fluids offered: Yes  Toileting and hygiene offered: Yes  Sitter present: yes Law enforcement present: Yes  Breakfast tray given at this time.  ENVIRONMENTAL ASSESSMENT Potentially harmful objects out of patient reach: Yes.   Personal belongings secured: Yes.   Patient dressed in hospital provided attire only: Yes.   Plastic bags out of patient reach: Yes.   Patient care equipment (cords, cables, call bells, lines, and drains) shortened, removed, or accounted for: Yes.   Equipment and supplies removed from bottom of stretcher: Yes.   Potentially toxic materials out of patient reach: Yes.   Sharps container removed or out of patient reach: Yes.    

## 2014-11-15 NOTE — ED Notes (Signed)
Pt in room. No complaints or concerns voiced at this time. No abnormal behavior noted at this time. Will continue to monitor with q15 min checks. [employer] officer in area. 

## 2014-11-15 NOTE — ED Provider Notes (Signed)
-----------------------------------------   6:26 PM on 11/15/2014 -----------------------------------------  Patient has been seen and evaluated by psychiatry. Patient is now sober and denying any suicidal ideation per psychiatry. Patient has follow-up at the [facility]. [facility] discharge home at this time.  Minna Antis, MD 11/15/14 [phone]

## 2014-11-15 NOTE — Consult Note (Signed)
[facility] Face-to-Face Psychiatry Consult   Reason for Consult:  Consult for this 44 year old man with a history of depression and PTSD who came in to the hospital at the request of his wife because he had taken amitriptyline and was drinking. Referring Physician:  Cinda Quest Patient Identification: Joseph Becker MRN:  [id number] Principal Diagnosis: Depression, major, recurrent, moderate Diagnosis:   Patient Active Problem List   Diagnosis Date Noted  . Depression, major, recurrent, moderate [F33.1] 11/15/2014  . PTSD (post-traumatic stress disorder) [F43.10] 11/15/2014  . Rheumatoid arthritis [M06.9] 06/12/2013  . Essential hypertension, benign [I10] 06/12/2013  . Alcohol abuse [F10.10] 06/12/2013  . Neck pain, bilateral [M54.2] 06/12/2013  . Bilateral shoulder pain [M25.511, M25.512] 06/12/2013  . TRANSAMINASES, SERUM, ELEVATED [R74.0] 10/30/2007    Total Time spent with patient: 1 hour  Subjective:   Joseph Becker is a 44 y.o. male patient admitted with "I'm ready to go now".  HPI:  Information from the patient and the chart. Reviewed old notes as well. Reviewed labs and vital signs. Patient's wife called 911 because the patient had taken an extra amount of amitriptyline. The patient's wife had reported a belief that he was trying to kill himself and claimed he had made suicidal statements. The patient denies that he had made suicidal statements but admitted that he said that he wouldn't mind dying. He claims that he only took an extra 100 mg of amitriptyline on top of his normal 100 mg because he was hoping that it would make him sleep soundly. His mood has been chronically depressed although he does not stay constantly down all the time. He has chronic poor sleep which is improved now that he is taking medication. He does not feel helpless or hopeless but does lack a lot of enjoyment. He denies having any suicidal thoughts or wishes at all. Denies any psychotic symptoms. Patient is being treated  at the [facility] for posttraumatic stress disorder and depression. He was recently started on a new antidepressive but does not know what it is. He admits that he drinks alcohol about 3 times a week saying he drinks 3 or 4 drinks each time he does drink. He says he believes that it is not a problem.  Past psychiatric history: Denies ever having tried to kill himself in the past. Denies ever being admitted to a psychiatric hospital. He says he has a diagnosis of fibromyalgia and posttraumatic stress disorder. He is currently taking antidepressive medicine prescribed by the [place]. Takes amitriptyline by his account to help with sleep. No history of psychotic disorder.  Social history: Patient lives with his wife and adolescent son. He works Brewing technologist. Dislikes his job. Finds it stressful and worries about money as well. Has some squabbling with his wife but denies any homicidal ideation and says that the situation at home is not getting any worse.  Family history: Patient reports that there is a family history of bipolar disorder and a first degree relative. No history of suicide.  Substance abuse history: Patient reports that he is currently drinking 3 nights a week about 3 or 4 drinks at a time. This is an improvement over his previous rate of 7 drinks a night every night. He denies that he is using any other drugs and denies that he abuses his medicine.  Past Psychiatric History: As noted above he has no previous psychiatric hospitalizations or suicide attempts. Diagnoses of posttraumatic stress disorder and depression and fibromyalgia Risk to Self: Suicidal Ideation: No-Not Currently/Within Last  6 Months Suicidal Intent: No-Not Currently/Within Last 6 Months Is patient at risk for suicide?: No Suicidal Plan?: No-Not Currently/Within Last 6 Months Specify Current Suicidal Plan: Overdose on medication Access to Means: Yes Specify Access to Suicidal Means: Can get medications What has been your  use of drugs/alcohol within the last 12 months?: Alcohol Other Self Harm Risks: None Reported Triggers for Past Attempts: Family contact, Spouse contact, Unpredictable Intentional Self Injurious Behavior: None Risk to Others: Homicidal Ideation: No Thoughts of Harm to Others: No Current Homicidal Intent: No Current Homicidal Plan: No Access to Homicidal Means: No Identified Victim: None Reported History of harm to others?: No Assessment of Violence: None Noted Violent Behavior Description: None Reported Does patient have access to weapons?: No Criminal Charges Pending?: No Does patient have a court date: No Prior Inpatient Therapy: Prior Inpatient Therapy: No Prior Therapy Dates: n/a Prior Therapy Facilty/Provider(s): n/a Reason for Treatment: n/a Prior Outpatient Therapy: Prior Outpatient Therapy: Yes Prior Therapy Dates: Current Prior Therapy Facilty/Provider(s): [facility]  Reason for Treatment: PTSD Does patient have an ACCT team?: No Does patient have Intensive In-House Services?  : No Does patient have [facility] services? : No Does patient have P4CC services?: No  Past Medical History:  Past Medical History  Diagnosis Date  . Arthritis   . Blood in stool    No past surgical history on file. Family History:  Family History  Problem Relation Age of Onset  . Hypertension Mother   . Diabetes Mother   . Heart disease Mother 10    CAD  . Dementia Father    Family Psychiatric  History: Bipolar disorder in a first degree relative Social History:  History  Alcohol Use  . 6.0 oz/week  . 12 drink(s) per week     History  Drug Use No    Social History   Social History  . Marital Status: Married    Spouse Name: N/A  . Number of Children: N/A  . Years of Education: N/A   Social History Main Topics  . Smoking status: Current Every Day Smoker -- 1.00 packs/day    Types: Cigarettes  . Smokeless tobacco: Never Used     Comment: precontemplative.  . Alcohol Use: 6.0  oz/week    12 drink(s) per week  . Drug Use: No  . Sexual Activity:    Partners: Female   Other Topics Concern  . Not on file   Social History Narrative   Married   Additional Social History:    Pain Medications: No abuse reported Prescriptions: No abuse reported Over the Counter: No abuse reported History of alcohol / drug use?: Yes Longest period of sobriety (when/how long): Unknown Negative Consequences of Use:  (None Reported) Withdrawal Symptoms:  (None Reported) Name of Substance 1: Alcohol 1 - Age of First Use: 21 1 - Amount (size/oz): 4 to 5, 12oz Beers 1 - Frequency: 4 days out of the week 1 - Duration: Several years 1 - Last Use / Amount: 11/14/2014                   Allergies:  No Known Allergies  Labs:  Results for orders placed or performed during the hospital encounter of 11/15/14 (from the past 48 hour(s))  Comprehensive metabolic panel     Status: Abnormal   Collection Time: 11/15/14  1:24 AM  Result Value Ref Range   Sodium 134 (L) 135 - 145 mmol/L   Potassium 3.5 3.5 - 5.1 mmol/L  Chloride 103 101 - 111 mmol/L   CO2 22 22 - 32 mmol/L   Glucose, Bld 96 65 - 99 mg/dL   BUN 19 6 - 20 mg/dL   Creatinine, Ser 1.08 0.61 - 1.24 mg/dL   Calcium 8.8 (L) 8.9 - 10.3 mg/dL   Total Protein 7.8 6.5 - 8.1 g/dL   Albumin 4.7 3.5 - 5.0 g/dL   AST 51 (H) 15 - 41 U/L   ALT 79 (H) 17 - 63 U/L   Alkaline Phosphatase 64 38 - 126 U/L   Total Bilirubin 1.2 0.3 - 1.2 mg/dL   GFR calc non Af Amer >60 >60 mL/min   GFR calc Af Amer >60 >60 mL/min    Comment: (NOTE) The eGFR has been calculated using the CKD EPI equation. This calculation has not been validated in all clinical situations. eGFR's persistently <60 mL/min signify possible Chronic Kidney Disease.    Anion gap 9 5 - 15  Ethanol (ETOH)     Status: Abnormal   Collection Time: 11/15/14  1:24 AM  Result Value Ref Range   Alcohol, Ethyl (B) 99 (H) <5 mg/dL    Comment:        LOWEST DETECTABLE LIMIT  FOR SERUM ALCOHOL IS 5 mg/dL FOR MEDICAL PURPOSES ONLY   Salicylate level     Status: None   Collection Time: 11/15/14  1:24 AM  Result Value Ref Range   Salicylate Lvl <8.7 2.8 - 30.0 mg/dL  Acetaminophen level     Status: Abnormal   Collection Time: 11/15/14  1:24 AM  Result Value Ref Range   Acetaminophen (Tylenol), Serum <10 (L) 10 - 30 ug/mL    Comment:        THERAPEUTIC CONCENTRATIONS VARY SIGNIFICANTLY. A RANGE OF 10-30 ug/mL MAY BE AN EFFECTIVE CONCENTRATION FOR MANY PATIENTS. HOWEVER, SOME ARE BEST TREATED AT CONCENTRATIONS OUTSIDE THIS RANGE. ACETAMINOPHEN CONCENTRATIONS >150 ug/mL AT 4 HOURS AFTER INGESTION AND >50 ug/mL AT 12 HOURS AFTER INGESTION ARE OFTEN ASSOCIATED WITH TOXIC REACTIONS.   CBC     Status: Abnormal   Collection Time: 11/15/14  1:24 AM  Result Value Ref Range   WBC 4.9 3.8 - 10.6 K/uL   RBC 5.48 4.40 - 5.90 MIL/uL   Hemoglobin 14.6 13.0 - 18.0 g/dL   HCT 45.7 40.0 - 52.0 %   MCV 83.3 80.0 - 100.0 fL   MCH 26.7 26.0 - 34.0 pg   MCHC 32.0 32.0 - 36.0 g/dL   RDW 15.1 (H) 11.5 - 14.5 %   Platelets 194 150 - 440 K/uL  Blood gas, arterial     Status: None   Collection Time: 11/15/14  2:10 AM  Result Value Ref Range   FIO2 0.21    Delivery systems ROOM AIR    pH, Arterial 7.37 7.350 - 7.450   pCO2 arterial 40 32.0 - 48.0 mmHg   pO2, Arterial 95 83.0 - 108.0 mmHg   Bicarbonate 23.1 21.0 - 28.0 mEq/L   Acid-base deficit 2.0 0.0 - 2.0 mmol/L   O2 Saturation 97.2 %   Patient temperature 37.0    Collection site RIGHT RADIAL    Sample type ARTERIAL DRAW    Allens test (pass/fail) PASS PASS  Urine Drug Screen, Qualitative ([facility] only)     Status: Abnormal   Collection Time: 11/15/14  9:52 AM  Result Value Ref Range   Tricyclic, Ur Screen POSITIVE (A) NONE DETECTED   Amphetamines, Ur Screen NONE DETECTED NONE DETECTED   MDMA (Ecstasy)Ur  Screen NONE DETECTED NONE DETECTED   Cocaine Metabolite,Ur [place] NONE DETECTED NONE DETECTED   Opiate, Ur  Screen NONE DETECTED NONE DETECTED   Phencyclidine (PCP) Ur S NONE DETECTED NONE DETECTED   Cannabinoid 50 Ng, Ur [place] NONE DETECTED NONE DETECTED   Barbiturates, Ur Screen NONE DETECTED NONE DETECTED   Benzodiazepine, Ur Scrn NONE DETECTED NONE DETECTED   Methadone Scn, Ur NONE DETECTED NONE DETECTED    Comment: (NOTE) 086  Tricyclics, urine               Cutoff 1000 ng/mL 200  Amphetamines, urine             Cutoff 1000 ng/mL 300  MDMA (Ecstasy), urine           Cutoff 500 ng/mL 400  Cocaine Metabolite, urine       Cutoff 300 ng/mL 500  Opiate, urine                   Cutoff 300 ng/mL 600  Phencyclidine (PCP), urine      Cutoff 25 ng/mL 700  Cannabinoid, urine              Cutoff 50 ng/mL 800  Barbiturates, urine             Cutoff 200 ng/mL 900  Benzodiazepine, urine           Cutoff 200 ng/mL 1000 Methadone, urine                Cutoff 300 ng/mL 1100 1200 The urine drug screen provides only a preliminary, unconfirmed 1300 analytical test result and should not be used for non-medical 1400 purposes. Clinical consideration and professional judgment should 1500 be applied to any positive drug screen result due to possible 1600 interfering substances. A more specific alternate chemical method 1700 must be used in order to obtain a confirmed analytical result.  1800 Gas chromato graphy / mass spectrometry (GC/MS) is the preferred 1900 confirmatory method.     No current facility-administered medications for this encounter.   Current Outpatient Prescriptions  Medication Sig Dispense Refill  . HYDROcodone-acetaminophen (NORCO/VICODIN) 5-325 MG per tablet 1 to 2 tabs every 4 to 6 hours as needed for pain. 20 tablet 0  . lisinopril-hydrochlorothiazide (PRINZIDE,ZESTORETIC) 10-12.5 MG per tablet Take 1 tablet by mouth daily.    . meloxicam (MOBIC) 15 MG tablet Take 15 mg by mouth daily.    . methotrexate (RHEUMATREX) 2.5 MG tablet Take 15 mg by mouth once a week. Caution:Chemotherapy.  Protect from light.    Marland Kitchen omeprazole (PRILOSEC) 20 MG capsule Take 40 mg by mouth daily.      Musculoskeletal: Strength & Muscle Tone: within normal limits Gait & Station: normal Patient leans: N/A  Psychiatric Specialty Exam: Review of Systems  Constitutional: Negative.   HENT: Negative.   Eyes: Negative.   Respiratory: Negative.   Cardiovascular: Negative.   Gastrointestinal: Negative.   Musculoskeletal: Negative.   Skin: Negative.   Neurological: Negative.   Psychiatric/Behavioral: Positive for depression and substance abuse. Negative for suicidal ideas, hallucinations and memory loss. The patient has insomnia. The patient is not nervous/anxious.     Blood pressure 128/68, pulse 85, temperature 98.5 F (36.9 C), temperature source Oral, resp. rate 19, height $RemoveBe'5\' 7"'[id number]$  (1.702 m), weight 87.544 kg (193 lb), SpO2 99 %.Body mass index is 30.22 kg/(m^2).  General Appearance: Fairly Groomed  Engineer, water::  Minimal  Speech:  Slow  Volume:  Decreased  Mood:  Euthymic  Affect:  Congruent  Thought Process:  Goal Directed  Orientation:  Full (Time, Place, and Person)  Thought Content:  Negative  Suicidal Thoughts:  No  Homicidal Thoughts:  No  Memory:  Immediate;   Good Recent;   Good Remote;   Good  Judgement:  Intact  Insight:  Fair  Psychomotor Activity:  Normal  Concentration:  Fair  Recall:  [place] of Knowledge:Good  Language: Good  Akathisia:  No  Handed:  Right  AIMS (if indicated):     Assets:  Communication Skills Desire for Improvement Financial Resources/Insurance Housing Intimacy Physical Health Resilience Social Support  ADL's:  Intact  Cognition: WNL  Sleep:      Treatment Plan Summary: Plan After interview and observation during the day and review of the chart the patient does not appear to meet commitment criteria. He has not shown any aggressive behavior in the hospital and has consistently denied any suicidal ideation. He has no documented past  history of suicidal behavior. Does not currently appear to have a major depression. Is able to articulate positive plans for the future. Patient does appear to have an alcohol abuse problem and has been diagnosed with PTSD as well. Patient was counseled about treatment for depression and to continue following up with the [place]. He was counseled about the negative impact of drinking on his depression and urged to continue cutting down on his alcohol use. [facility] Case discussed with emergency room doctor. Patient can be released from the ER.  Disposition: Patient does not meet criteria for psychiatric inpatient admission. Supportive therapy provided about ongoing stressors. Discussed crisis plan, support from social network, calling 911, coming to the Emergency Department, and calling Suicide Hotline.  John Clapacs 11/15/2014 5:48 PM

## 2014-11-15 NOTE — ED Notes (Signed)
Pt's wife, Bassem Bernasconi, requesting pt's debit card so she can pay a bill. Pt alert and oriented at this time, pt gives verbal permission to this RN and Engineer, materials for wife, Darrel Gloss, to get debit card. Pt given pt's belongings bag in sally port with this RN and [employer] officer present. Pt found wallet in his bag and gave this RN a blue card to give his wife. Pt refusing his wallet to be locked up at this time. Pt's wife ID out in lobby to make sure she is who she claims to be. Wife given blue card.

## 2014-11-15 NOTE — ED Notes (Signed)
BEHAVIORAL HEALTH ROUNDING Patient sleeping: No. Patient alert and oriented: yes Behavior appropriate: Yes.  ; If no, describe:  Nutrition and fluids offered: Yes  Toileting and hygiene offered: Yes  Sitter present: yes Law enforcement present: Yes  

## 2014-11-15 NOTE — ED Notes (Addendum)
Received a call from Catering manager that the [employer] is on the phone  - pt's wife has called and reported that we are hiding her husband and won't let her see him  - Alfredia Client talked with the lady from the [employer]    Pt is currently talking on the phone with his brother

## 2014-11-15 NOTE — BH Assessment (Addendum)
Assessment Note  Joseph Becker is an 44 y.o. male who presents to ER via EMS due to his wife calling 911, for taking an extra amount of his medications. According to the patient, he took two extra pills of his Amitriptyline to go to sleep. Patient states, he has done this before, due to not being able to sleep.  Patient further reports of having no major stressors or concerns that are taking place in his life. He admits to ongoing problems in his relationship with his wife and financial hardships. "That's just life. Stuff happens but I'm okay..." Patient stated throughout the assessment, he wasn't trying to kill or harm himself. He voiced frustration that he was going to miss his appointment with [facility] today (11/15/2014). He had a follow up, medication management appointment with his psychiatrist.  According to the patient's wife ([name]), he told her he had taking medications with the intent of killing himself. Wife further explains, they have had ongoing marital problems. A history of infidelity.The wife states, he found out she's been talking to someone else. So on yesterday, he arrived early to her job, several times, to pick her up. "He came three hours early and then an hour early. I guess, he was trying to figure out what I was doing."  When they got home, the patient and his wife started drinking alcohol. During that time, the patient became quiet and then he eventually told her, he didn't want to be around her. Wife states, she was frightened due to him being quiet. In the past, "he would argue and yell. But this time he didn't say nothing. He just sat there. He usually take it out on me. This time he took it out on himself."  Wife states, the patient has been frustrated due to a lack of fiances. Patient has to pay child support to his former wife and it's a large portion of his "check." Patient and current wife, were married before. When they divorced, patient remarried and had  started another family. When that relationship ended, the patient reconnected back to his current wife.  According to the patient current Psychiatrist (Dr. Mitchell Heir), at the [facility], he is new to him. He have seen the patient two times. [facility]. Patient started with the psych portion approximately 2 months. Patient has history of alcohol use and Psych MD believes he minimize his use.  [facility] [place] is currently on diversion.    Diagnosis: PTSD,  Depression & Alcohol Use D/O.  Past Medical History:  Past Medical History  Diagnosis Date  . Arthritis   . Blood in stool     No past surgical history on file.  Family History:  Family History  Problem Relation Age of Onset  . Hypertension Mother   . Diabetes Mother   . Heart disease Mother 10    CAD  . Dementia Father     Social History:  reports that he has been smoking Cigarettes.  He has been smoking about 1.00 pack per day. He has never used smokeless tobacco. He reports that he drinks about 6.0 oz of alcohol per week. He reports that he does not use illicit drugs.  Additional Social History:  Alcohol / Drug Use Pain Medications: No abuse reported Prescriptions: No abuse reported Over the Counter: No abuse reported History of alcohol / drug use?: Yes Longest period of sobriety (when/how long): Unknown Negative Consequences of Use:  (  None Reported) Withdrawal Symptoms:  (None Reported) Substance #1 Name of Substance 1: Alcohol 1 - Age of First Use: 21 1 - Amount (size/oz): 4 to 5, 12oz Beers 1 - Frequency: 4 days out of the week 1 - Duration: Several years 1 - Last Use / Amount: 11/14/2014  CIWA: CIWA-Ar BP: 128/68 mmHg Pulse Rate: 85 COWS:    Allergies: No Known Allergies  Home Medications:  (Not in a hospital admission)  OB/GYN Status:  No LMP for male patient.  General Assessment Data Location of Assessment: [facility] ED TTS Assessment: In system Is this a  Tele or Face-to-Face Assessment?: Face-to-Face Is this an Initial Assessment or a Re-assessment for this encounter?: Initial Assessment Marital status: Long term relationship Maiden name: n/a Is patient pregnant?: No Pregnancy Status: No Living Arrangements: Spouse/significant other, Children Can pt return to current living arrangement?: Yes Admission Status: Involuntary Is patient capable of signing voluntary admission?: Yes Referral Source: Self/Family/Friend Insurance type: Medicaid  Medical Screening Exam [facility] Walk-in ONLY) Medical Exam completed: Yes  Crisis Care Plan Living Arrangements: Spouse/significant other, Children Name of Psychiatrist: [facility] [place] Name of Therapist: [facility] [place]  Education Status Is patient currently in school?: No Current Grade: n/a Highest grade of school patient has completed: [employer] Name of school: n/a Solicitor person: n/a  Risk to self with the past 6 months Suicidal Ideation: No-Not Currently/Within Last 6 Months Has patient been a risk to self within the past 6 months prior to admission? : Yes Suicidal Intent: No-Not Currently/Within Last 6 Months Has patient had any suicidal intent within the past 6 months prior to admission? : Yes Is patient at risk for suicide?: No Suicidal Plan?: No-Not Currently/Within Last 6 Months Has patient had any suicidal plan within the past 6 months prior to admission? : Yes Specify Current Suicidal Plan: Overdose on medication Access to Means: Yes Specify Access to Suicidal Means: Can get medications What has been your use of drugs/alcohol within the last 12 months?: Alcohol Previous Attempts/Gestures: No Other Self Harm Risks: None Reported Triggers for Past Attempts: Family contact, Spouse contact, Unpredictable Intentional Self Injurious Behavior: None Family Suicide History: No Recent stressful life event(s): Conflict (Comment), Turmoil (Comment), Financial Problems Persecutory voices/beliefs?:  No Depression: Yes Depression Symptoms: Feeling angry/irritable, Feeling worthless/self pity, Isolating Substance abuse history and/or treatment for substance abuse?: Yes Suicide prevention information given to non-admitted patients: Not applicable  Risk to Others within the past 6 months Homicidal Ideation: No Does patient have any lifetime risk of violence toward others beyond the six months prior to admission? : No Thoughts of Harm to Others: No Current Homicidal Intent: No Current Homicidal Plan: No Access to Homicidal Means: No Identified Victim: None Reported History of harm to others?: No Assessment of Violence: None Noted Violent Behavior Description: None Reported Does patient have access to weapons?: No Criminal Charges Pending?: No Does patient have a court date: No Is patient on probation?: No  Psychosis Hallucinations: None noted Delusions: None noted  Mental Status Report Appearance/Hygiene: In hospital gown, In scrubs, Unremarkable Eye Contact: Poor Motor Activity: Freedom of movement, Unremarkable Speech: Logical/coherent, Soft Level of Consciousness: Alert Mood: Depressed, Sad, Pleasant Affect: Appropriate to circumstance, Sad Anxiety Level: Minimal Thought Processes: Coherent, Relevant Judgement: Impaired Orientation: Person, Place, Time, Situation, Appropriate for developmental age Obsessive Compulsive Thoughts/Behaviors: Minimal  Cognitive Functioning Concentration: Normal Memory: Recent Intact, Remote Intact IQ: Average Insight: Poor Impulse Control: Poor Appetite: Fair Weight Loss: 0 Weight Gain: 0 Sleep: No Change Total Hours  of Sleep: 7 Vegetative Symptoms: None  ADLScreening [facility] Assessment Services) Patient's cognitive ability adequate to safely complete daily activities?: Yes Patient able to express need for assistance with ADLs?: Yes Independently performs ADLs?: Yes (appropriate for developmental age)  Prior Inpatient  Therapy Prior Inpatient Therapy: No Prior Therapy Dates: n/a Prior Therapy Facilty/Provider(s): n/a Reason for Treatment: n/a  Prior Outpatient Therapy Prior Outpatient Therapy: Yes Prior Therapy Dates: Current Prior Therapy Facilty/Provider(s): [facility] [place]  Reason for Treatment: PTSD Does patient have an ACCT team?: No Does patient have Intensive In-House Services?  : No Does patient have [facility] services? : No Does patient have P4CC services?: No  ADL Screening (condition at time of admission) Patient's cognitive ability adequate to safely complete daily activities?: Yes Patient able to express need for assistance with ADLs?: Yes Independently performs ADLs?: Yes (appropriate for developmental age)       Abuse/Neglect Assessment (Assessment to be complete while patient is alone) Physical Abuse: Denies Verbal Abuse: Denies Sexual Abuse: Denies Exploitation of patient/patient's resources: Denies Self-Neglect: Denies Values / Beliefs Cultural Requests During Hospitalization: None Spiritual Requests During Hospitalization: None Consults Spiritual Care Consult Needed: No Social Work Consult Needed: No      Additional Information 1:1 In Past 12 Months?: No CIRT Risk: No Elopement Risk: No Does patient have medical clearance?: Yes  Child/Adolescent Assessment Running Away Risk: Denies (Patient is an adult)  Disposition:  Disposition Initial Assessment Completed for this Encounter: Yes Disposition of Patient: Other dispositions (Pysch MD to see) Other disposition(s): Other (Comment) (Pysch MD to see)  On Site Evaluation by:   Reviewed with Physician:    Lilyan Gilford, MS, LCAS, LPC, NCC, CCSI 11/15/2014 11:36 AM

## 2014-11-15 NOTE — Discharge Instructions (Signed)

## 2014-11-15 NOTE — ED Notes (Signed)
Supper provided along with another drink     Appropriate to stimulation  No verbalized needs or concerns at this time  NAD assessed  Continue to monitor

## 2014-11-15 NOTE — ED Notes (Addendum)
Pt brought here by police for possible overdose, states took 4 amitriptyline at 2130.  Pt denies any SI or HI. Pt sleepy in triage but arousable.

## 2014-11-15 NOTE — ED Provider Notes (Signed)
[facility] Emergency Department Provider Note  ____________________________________________  Time seen: Approximately 1:55 AM  I have reviewed the triage vital signs and the nursing notes.   HISTORY  Chief Complaint Drug Overdose  History limited by somnolence  HPI Joseph Becker is a 44 y.o. male who presents to the ED from home brought by police for probable overdose. Spouse called police for patient taking for amitriptyline approximately 9:30 PM. Patient denied SI to triage nurse; however, endorses SI to primary nurse. Denies coingestions. Admits to drinking "beers". Voices no medical complaints. Further history unable to be obtained secondary to patient's drowsiness.   Past Medical History  Diagnosis Date  . Arthritis   . Blood in stool     Patient Active Problem List   Diagnosis Date Noted  . Rheumatoid arthritis 06/12/2013  . Essential hypertension, benign 06/12/2013  . Alcohol abuse 06/12/2013  . Neck pain, bilateral 06/12/2013  . Bilateral shoulder pain 06/12/2013  . TRANSAMINASES, SERUM, ELEVATED 10/30/2007    No past surgical history on file.  Current Outpatient Rx  Name  Route  Sig  Dispense  Refill  . HYDROcodone-acetaminophen (NORCO/VICODIN) 5-325 MG per tablet      1 to 2 tabs every 4 to 6 hours as needed for pain.   20 tablet   0   . lisinopril-hydrochlorothiazide (PRINZIDE,ZESTORETIC) 10-12.5 MG per tablet   Oral   Take 1 tablet by mouth daily.         . meloxicam (MOBIC) 15 MG tablet   Oral   Take 15 mg by mouth daily.         . methotrexate (RHEUMATREX) 2.5 MG tablet   Oral   Take 15 mg by mouth once a week. Caution:Chemotherapy. Protect from light.         Marland Kitchen omeprazole (PRILOSEC) 20 MG capsule   Oral   Take 40 mg by mouth daily.           Allergies Review of patient's allergies indicates no known allergies.  Family History  Problem Relation Age of Onset  . Hypertension Mother   . Diabetes Mother    . Heart disease Mother 25    CAD  . Dementia Father     Social History Social History  Substance Use Topics  . Smoking status: Current Every Day Smoker -- 1.00 packs/day    Types: Cigarettes  . Smokeless tobacco: Never Used     Comment: precontemplative.  . Alcohol Use: 6.0 oz/week    12 drink(s) per week    Review of Systems Constitutional: No fever/chills Eyes: No visual changes. ENT: No sore throat. Cardiovascular: Denies chest pain. Respiratory: Denies shortness of breath. Gastrointestinal: No abdominal pain.  No nausea, no vomiting.  No diarrhea.  No constipation. Genitourinary: Negative for dysuria. Musculoskeletal: Negative for back pain. Skin: Negative for rash. Neurological: Negative for headaches, focal weakness or numbness. Psychiatric:Positive for depression with SI.  Limited secondary to drowsiness; otherwise 10-point ROS otherwise negative.  ____________________________________________   PHYSICAL EXAM:  VITAL SIGNS: ED Triage Vitals  Enc Vitals Group     BP 11/15/14 0119 109/66 mmHg     Pulse Rate 11/15/14 0119 108     Resp 11/15/14 0119 16     Temp 11/15/14 0119 97.3 F (36.3 C)     Temp Source 11/15/14 0119 Oral     SpO2 11/15/14 0119 97 %     Weight 11/15/14 0119 193 lb (87.544 kg)     Height 11/15/14 0119 5'  7" (1.702 m)     Head Cir --      Peak Flow --      Pain Score 11/15/14 0121 8     Pain Loc --      Pain Edu? --      Excl. in GC? --     Constitutional: Drowsy but arousable to voice. Well appearing and in no acute distress. Eyes: Conjunctivae are normal. PERRL. EOMI. Head: Atraumatic. Nose: No congestion/rhinnorhea. Mouth/Throat: Mucous membranes are moist.  Oropharynx non-erythematous. Neck: No stridor. No carotid bruits. No cervical spine tenderness to palpation. Cardiovascular: Normal rate, regular rhythm. Grossly normal heart sounds.  Good peripheral circulation. Respiratory: Normal respiratory effort.  No retractions.  Lungs CTAB. Gastrointestinal: Soft and nontender. No distention. No abdominal bruits. No CVA tenderness. Musculoskeletal: No lower extremity tenderness nor edema.  No joint effusions. Neurologic:  Normal speech and language. No gross focal neurologic deficits are appreciated.  Skin:  Skin is warm, dry and intact. No rash noted. Psychiatric: Mood and affect are flat. Speech and behavior are flat.  ____________________________________________   LABS (all labs ordered are listed, but only abnormal results are displayed)  Labs Reviewed  COMPREHENSIVE METABOLIC PANEL - Abnormal; Notable for the following:    Sodium 134 (*)    Calcium 8.8 (*)    AST 51 (*)    ALT 79 (*)    All other components within normal limits  CBC - Abnormal; Notable for the following:    RDW 15.1 (*)    All other components within normal limits  ETHANOL  SALICYLATE LEVEL  ACETAMINOPHEN LEVEL  URINALYSIS COMPLETEWITH MICROSCOPIC ([facility] ONLY)  URINE DRUG SCREEN, QUALITATIVE ([facility] ONLY)  BLOOD GAS, ARTERIAL   ____________________________________________  EKG  ED ECG REPORT I, SUNG,JADE J, the attending physician, personally viewed and interpreted this ECG.   Date: 11/15/2014  EKG Time: 0147  Rate: 98  Rhythm: normal EKG, normal sinus rhythm  Axis: Normal  Intervals:none  ST&T Change: Nonspecific  ____________________________________________  RADIOLOGY  Portable chest x-ray (viewed by me, interpreted per Dr. Grace Isaac): Negative low-volume chest ____________________________________________   PROCEDURES  Procedure(s) performed: None  Critical Care performed: No  ____________________________________________   INITIAL IMPRESSION / ASSESSMENT AND PLAN / ED COURSE  Pertinent labs & imaging results that were available during my care of the patient were reviewed by me and considered in my medical decision making (see chart for details).  44 year old male who presents with intentional overdose of  amitriptyline approximately 9:30 PM. Per poison control, continue supportive care. Will obtain screening lab work including toxicology screen, EKG, ABG, chest x-ray. Given patient's drowsy state and denial of coingestions on top of overdose which happened over 4 hours ago, will hold off on PO charcoal for concern of aspiration. Will continue to observe in the ED. Will place under IVC for patient safety. Consult TTS and psychiatry to evaluate patient in the emergency department.  ----------------------------------------- 6:38 AM on 11/15/2014 -----------------------------------------  No events overnight. Patient resting in no acute distress. He is medically cleared for psychiatry evaluation. ____________________________________________   FINAL CLINICAL IMPRESSION(S) / ED DIAGNOSES  Final diagnoses:  Depression  Intentional overdose of beta-adrenergic blocking drug, initial encounter      Irean Hong, MD 11/15/14 [phone]

## 2014-11-15 NOTE — ED Notes (Signed)
1/1 bags of belongings returned to him and he verbalized that he received back all belongings that he came here with - discharge instructions reviewed with him and he verbalized agreement and understanding

## 2014-11-15 NOTE — ED Notes (Signed)
md Clapacs has consulted with him and he will be discharged to home    Appropriate to stimulation  No verbalized needs or concerns at this time  NAD assessed  Continue to monitor

## 2014-11-15 NOTE — ED Notes (Signed)
Shower completed. 

## 2016-11-10 IMAGING — CR DG CHEST 1V PORT
1 series · 1 of 1 positions shown · non-contrast
Comparison: 08/08/2006

CLINICAL DATA: Overdose

EXAM:
PORTABLE CHEST 1 VIEW

[portable]
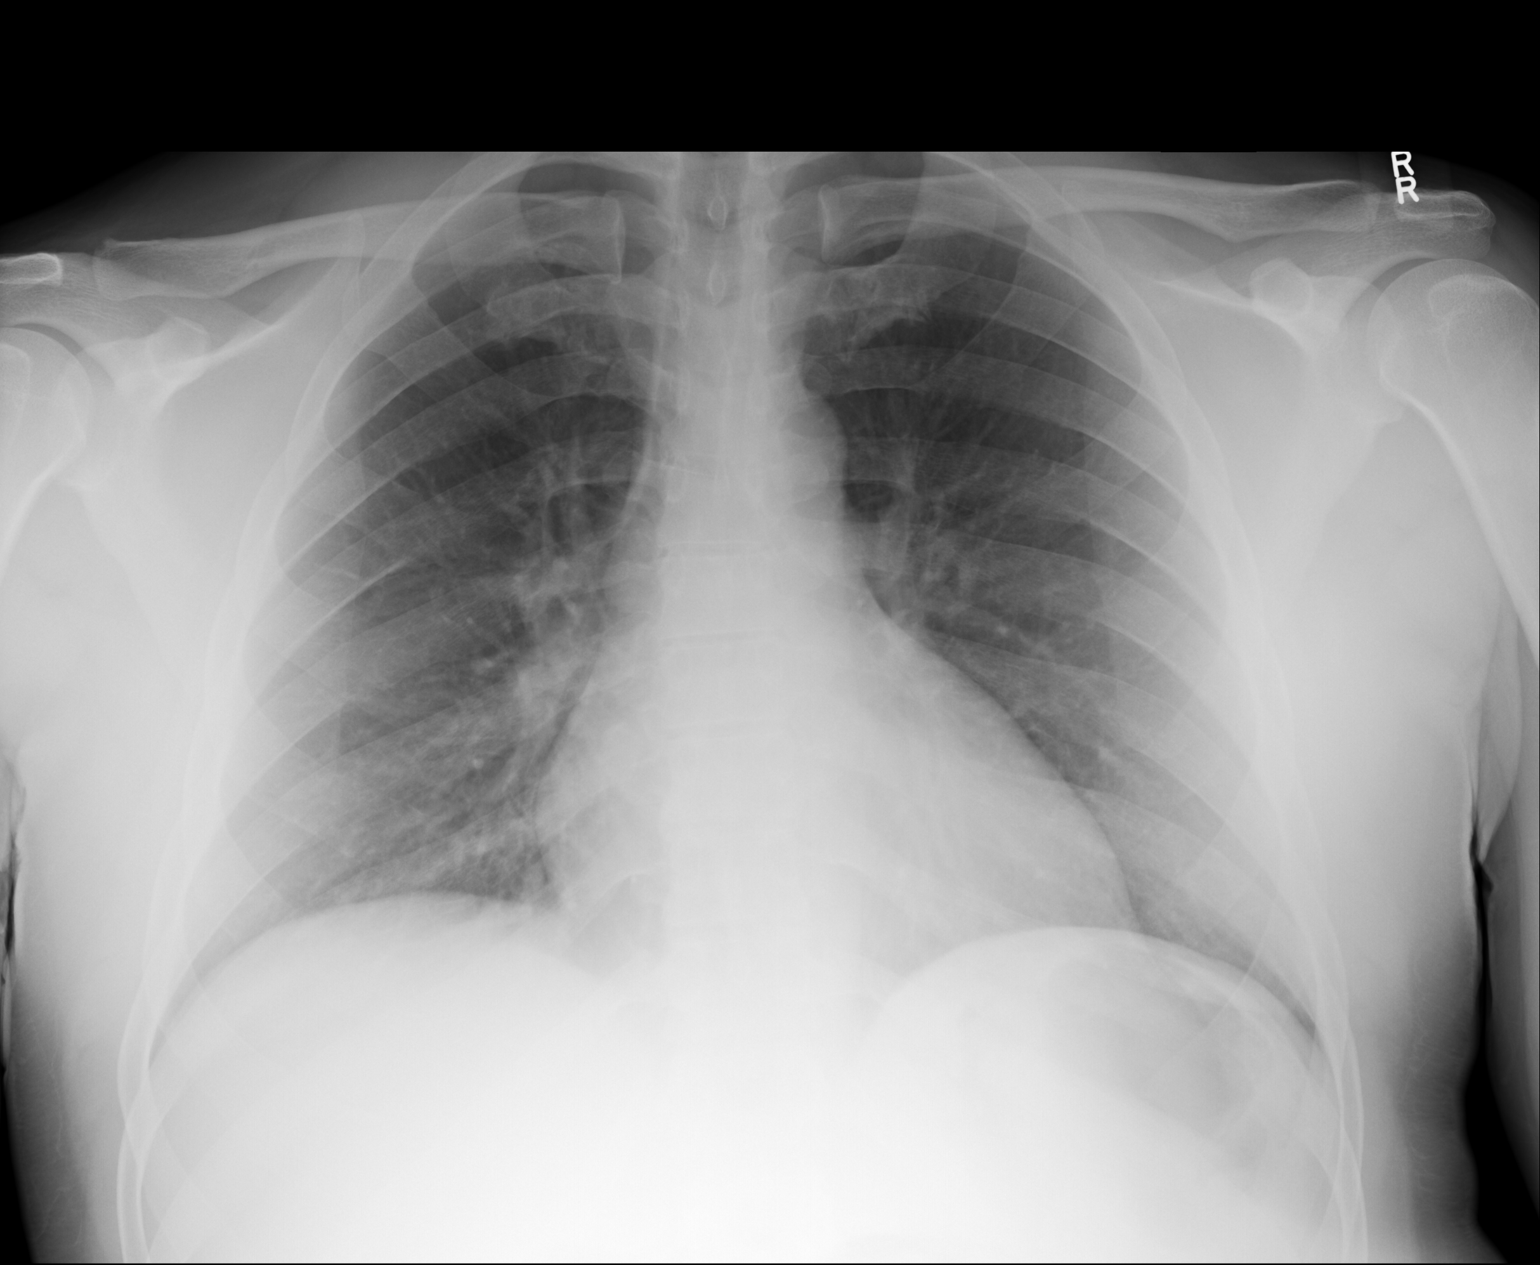

[1 of 1 positions shown; findings below may reference images not displayed]

FINDINGS: Normal heart size and mediastinal contours. Mild hypoventilation
with interstitial crowding. No effusion or pneumothorax. No acute
osseous findings.
IMPRESSION: Negative low volume chest.

## 2018-08-31 ENCOUNTER — Encounter: Attending: Family | Primary: Family

## 2018-09-07 ENCOUNTER — Ambulatory Visit: Attending: Family

## 2018-09-07 ENCOUNTER — Ambulatory Visit: Admit: 2018-09-07 | Discharge: 2018-09-07 | Payer: BLUE CROSS/BLUE SHIELD | Attending: Family | Primary: Family

## 2018-09-07 DIAGNOSIS — K219 Gastro-esophageal reflux disease without esophagitis: Secondary | ICD-10-CM

## 2018-09-07 NOTE — Progress Notes (Signed)
Ronald Herrera is a 48 y.o. male who presents to the office today for the following:  Chief Complaint   Patient presents with   ??? Establish Care   ??? Pain (Chronic)         Past Medical History:   Diagnosis Date   ??? Allergies    ??? Fibromyalgia    ??? GERD (gastroesophageal reflux disease)    ??? Hypertension        Past Surgical History:   Procedure Laterality Date   ??? HX ORTHOPAEDIC  12/2017    left foot first digit w/ screw placed       Family History   Problem Relation Age of Onset   ??? Diabetes Mother        Social History     Tobacco Use   ??? Smoking status: Current Every Day Smoker     Packs/day: 1.00     Types: Cigarettes   ??? Smokeless tobacco: Never Used   Substance Use Topics   ??? Alcohol use: Yes     Frequency: 4 or more times a week     Drinks per session: 3 or 4     Binge frequency: Monthly   ??? Drug use: Never        HPI  48 year old male who presents to establish care.  Is seen at the [place] [facility] also.  Pertinent medical history includes fibromyalgia, GERD, hypertension and seasonal allergies is controlled medication.  Primarily complains of foot and leg pain that has been chronic in nature.  Reports a fracture in 2018 that healed improperly.  Had delayed referral to podiatry and was told that only treatment would be a surgical fixation.  This was done in 12/2017 by podiatrist at [facility] and had screw placed.  Still has pain which radiates into his ankle from the great toe and affects his walking.  Wants to see another podiatrist to see if anything further can be done.  He also complains of pain in bilateral shoulders which has been ongoing for at least 10 years or more.  Recently started working at [employer] and this is increasing the pain in both of his shoulders.  Has difficulty lifting arms above head due to the pain.  In past he completed physical therapy and this was helpful but is never had much further work-up due to he has been diagnosed with fibromyalgia also.  Would like to see a  specialist for this also.      Review of Systems   Constitutional: Negative.    HENT: Negative for congestion, ear pain, hearing loss and sore throat.    Eyes: Negative.  Negative for blurred vision, photophobia, pain, discharge and redness.   Respiratory: Negative.    Cardiovascular: Negative for chest pain, palpitations, orthopnea, claudication and leg swelling.   Gastrointestinal: Negative for abdominal pain, blood in stool, constipation, diarrhea, heartburn, nausea and vomiting.   Genitourinary: Negative for dysuria, flank pain, frequency, hematuria and urgency.   Musculoskeletal: Positive for joint pain and myalgias. Negative for back pain, falls and neck pain.   Skin: Negative for itching and rash.   Neurological: Negative for dizziness, tingling, tremors, loss of consciousness, weakness and headaches.   Psychiatric/Behavioral: Negative for depression. The patient does not have insomnia.          Visit Vitals  BP 122/75 (BP 1 Location: Left arm, BP Patient Position: Sitting)   Pulse 90   Temp 97.9 ??F (36.6 ??C) (Oral)   Resp 18   Ht  5\' 7"  (1.702 m)   Wt 177 lb (80.3 kg)   SpO2 99%   BMI 27.72 kg/m??     Physical Exam  Vitals signs and nursing note reviewed.   Constitutional:       Appearance: Normal appearance. He is obese.   HENT:      Head: Normocephalic and atraumatic.      Nose: Nose normal.      Mouth/Throat:      Mouth: Mucous membranes are moist.      Pharynx: Oropharynx is clear.   Eyes:      Pupils: Pupils are equal, round, and reactive to light.   Cardiovascular:      Rate and Rhythm: Normal rate and regular rhythm.      Heart sounds: No murmur.   Pulmonary:      Effort: Pulmonary effort is normal.      Breath sounds: Normal breath sounds.   Abdominal:      General: Abdomen is flat. Bowel sounds are normal.      Palpations: Abdomen is soft.   Musculoskeletal: Normal range of motion.         General: Tenderness (bilateral trapezius and shoulders and dorsum left foot over 1st digit) present.   Skin:      General: Skin is warm and dry.   Neurological:      Mental Status: He is alert and oriented to person, place, and time. Mental status is at baseline.   Psychiatric:         Mood and Affect: Mood normal.         Behavior: Behavior normal.         Judgment: Judgment normal.          1. Gastroesophageal reflux disease without esophagitis  On pantoprazole as directed and symptoms reported as controlled    2. Essential hypertension  On lisinopril as directed and blood pressure is controlled    3. Fibromyalgia  On Cymbalta and gabapentin with symptoms reported as controlled  He has seen a rheumatologist in the past and advised to return as needed    4. Seasonal allergic rhinitis, unspecified trigger  On cetirizine as directed and symptoms reported as controlled    5. Insomnia, unspecified type  On trazodone as directed and symptoms reported as controlled    6. Tobacco dependence  Continue to encourage smoking cessation    7. Bilateral shoulder pain, unspecified chronicity  Symptoms have been chronic in nature but recently flared due to working more physical job with [employer]  Referred to PT for further eval and treat as this has helped him in the past  He also would like to see orthopedic for further eval and treatment  - REFERRAL TO PHYSICAL THERAPY  - REFERRAL TO ORTHOPEDICS    8. Left foot pain  S/P surgical repair left foot  first digit 12/2017 following  improper healing after fracture in 02/2016  Would like to get second opinion due to he still has pain which affects his balance and walking  Referral to podiatry for further eval and treatment  - REFERRAL TO PODIATRY    Follow-up and Dispositions    ?? Return in about 6 months (around 03/10/2019).

## 2018-09-07 NOTE — Progress Notes (Signed)
Ronald Herrera is a 48 y.o. male who presents to the office today for the following:  Chief Complaint   Patient presents with   ??? Establish Care   ??? Pain (Chronic)         Past Medical History:   Diagnosis Date   ??? Allergies    ??? Fibromyalgia    ??? GERD (gastroesophageal reflux disease)    ??? Hypertension        Past Surgical History:   Procedure Laterality Date   ??? HX ORTHOPAEDIC  12/2017    left foot first digit w/ screw placed       Family History   Problem Relation Age of Onset   ??? Diabetes Mother        Social History     Tobacco Use   ??? Smoking status: Current Every Day Smoker     Packs/day: 1.00     Types: Cigarettes   ??? Smokeless tobacco: Never Used   Substance Use Topics   ??? Alcohol use: Yes     Frequency: 4 or more times a week     Drinks per session: 3 or 4     Binge frequency: Monthly   ??? Drug use: Never        HPI  48 year old male who presents to establish care.  Is seen at the [facility] also.  Pertinent medical history includes fibromyalgia, GERD, hypertension and seasonal allergies is controlled medication.  Primarily complains of foot and leg pain that has been chronic in nature.  Reports a fracture in 2018 that healed improperly.  Had delayed referral to podiatry and was told that only treatment would be a surgical fixation.  This was done in 12/2017 by podiatrist at [facility] and had screw placed.  Still has pain which radiates into his ankle from the great toe and affects his walking.  Wants to see another podiatrist to see if anything further can be done.  He also complains of pain in bilateral shoulders which has been ongoing for at least 10 years or more.  Recently started working at [employer] and this is increasing the pain in both of his shoulders.  Has difficulty lifting arms above head due to the pain.  In past he completed physical therapy and this was helpful but is never had much further work-up due to he has been diagnosed with fibromyalgia also.  Would like to see a specialist for this  also.      Review of Systems   Constitutional: Negative.    HENT: Negative for congestion, ear pain, hearing loss and sore throat.    Eyes: Negative.  Negative for blurred vision, photophobia, pain, discharge and redness.   Respiratory: Negative.    Cardiovascular: Negative for chest pain, palpitations, orthopnea, claudication and leg swelling.   Gastrointestinal: Negative for abdominal pain, blood in stool, constipation, diarrhea, heartburn, nausea and vomiting.   Genitourinary: Negative for dysuria, flank pain, frequency, hematuria and urgency.   Musculoskeletal: Positive for joint pain and myalgias. Negative for back pain, falls and neck pain.   Skin: Negative for itching and rash.   Neurological: Negative for dizziness, tingling, tremors, loss of consciousness, weakness and headaches.   Psychiatric/Behavioral: Negative for depression. The patient does not have insomnia.          Visit Vitals  BP 122/75 (BP 1 Location: Left arm, BP Patient Position: Sitting)   Pulse 90   Temp 97.9 ??F (36.6 ??C) (Oral)   Resp 18   Ht  5\' 7"  (1.702 m)   Wt 177 lb (80.3 kg)   SpO2 99%   BMI 27.72 kg/m??     Physical Exam  Vitals signs and nursing note reviewed.   Constitutional:       Appearance: Normal appearance. He is obese.   HENT:      Head: Normocephalic and atraumatic.      Nose: Nose normal.      Mouth/Throat:      Mouth: Mucous membranes are moist.      Pharynx: Oropharynx is clear.   Eyes:      Pupils: Pupils are equal, round, and reactive to light.   Cardiovascular:      Rate and Rhythm: Normal rate and regular rhythm.      Heart sounds: No murmur.   Pulmonary:      Effort: Pulmonary effort is normal.      Breath sounds: Normal breath sounds.   Abdominal:      General: Abdomen is flat. Bowel sounds are normal.      Palpations: Abdomen is soft.   Musculoskeletal: Normal range of motion.         General: Tenderness (bilateral trapezius and shoulders and dorsum left foot over 1st digit) present.   Skin:     General: Skin is  warm and dry.   Neurological:      Mental Status: He is alert and oriented to person, place, and time. Mental status is at baseline.   Psychiatric:         Mood and Affect: Mood normal.         Behavior: Behavior normal.         Judgment: Judgment normal.          1. Gastroesophageal reflux disease without esophagitis  On pantoprazole as directed and symptoms reported as controlled    2. Essential hypertension  On lisinopril as directed and blood pressure is controlled    3. Fibromyalgia  On Cymbalta and gabapentin with symptoms reported as controlled  He has seen a rheumatologist in the past and advised to return as needed    4. Seasonal allergic rhinitis, unspecified trigger  On cetirizine as directed and symptoms reported as controlled    5. Insomnia, unspecified type  On trazodone as directed and symptoms reported as controlled    6. Tobacco dependence  Continue to encourage smoking cessation    7. Bilateral shoulder pain, unspecified chronicity  Symptoms have been chronic in nature but recently flared due to working more physical job with [employer]  Referred to PT for further eval and treat as this has helped him in the past  He also would like to see orthopedic for further eval and treatment  - REFERRAL TO PHYSICAL THERAPY  - REFERRAL TO ORTHOPEDICS    8. Left foot pain  S/P surgical repair left foot  first digit 12/2017 following  improper healing after fracture in 02/2016  Would like to get second opinion due to he still has pain which affects his balance and walking  Referral to podiatry for further eval and treatment  - REFERRAL TO PODIATRY    Follow-up and Dispositions    ?? Return in about 6 months (around 03/10/2019).

## 2019-01-04 ENCOUNTER — Encounter: Attending: Podiatrist | Primary: Family

## 2019-01-18 ENCOUNTER — Encounter: Attending: Podiatrist | Primary: Family

## 2019-01-24 ENCOUNTER — Ambulatory Visit: Attending: Podiatrist

## 2019-01-24 ENCOUNTER — Ambulatory Visit: Admit: 2019-01-24 | Discharge: 2019-01-24 | Payer: BLUE CROSS/BLUE SHIELD | Attending: Podiatrist | Primary: Family

## 2019-01-24 DIAGNOSIS — G8929 Other chronic pain: Secondary | ICD-10-CM

## 2019-01-24 DIAGNOSIS — M25572 Pain in left ankle and joints of left foot: Secondary | ICD-10-CM

## 2019-01-24 MED ORDER — DICLOFENAC 1 % TOPICAL GEL
1 % | Freq: Four times a day (QID) | CUTANEOUS | 5 refills | Status: AC
Start: 2019-01-24 — End: ?

## 2019-01-24 NOTE — Progress Notes (Signed)
[facility]    Subjective:         Patient is a 48 y.o. male who is being seen as a new pt for bilateral ankle pain.  Patient states she has been suffering from the pain for the past few years.  He admits to chronic sprains but denies any acute injury.  He states the pain rises to the level of 5 out of 10.  He states the pain is a dull ache that worsens throughout the day.  He denies any radiation of the pain.  He states weightbearing exacerbates the pain.  He denies any breaks in skin.  Denies any local/systemic signs of infection.  He states x-rays have not been performed to confirm a diagnosis.  He denies any other lower extremity complaints    Past Medical History:   Diagnosis Date   ??? Allergies    ??? Fibromyalgia    ??? GERD (gastroesophageal reflux disease)    ??? Hypertension      Past Surgical History:   Procedure Laterality Date   ??? HX ORTHOPAEDIC  12/2017    left foot first digit w/ screw placed       Family History   Problem Relation Age of Onset   ??? Diabetes Mother       Social History     Tobacco Use   ??? Smoking status: Current Every Day Smoker     Packs/day: 1.00     Types: Cigarettes   ??? Smokeless tobacco: Never Used   Substance Use Topics   ??? Alcohol use: Yes     Frequency: 4 or more times a week     Drinks per session: 3 or 4     Binge frequency: Monthly     Allergies   Allergen Reactions   ??? Percocet [Oxycodone-Acetaminophen] Anxiety     Prior to Admission medications    Medication Sig Start Date End Date Taking? Authorizing Provider   diclofenac (VOLTAREN) 1 % gel Apply  to affected area four (4) times daily. 01/24/19  Yes Dezzie Badilla, Larkin Ina, DPM   acyclovir (ZOVIRAX) 200 mg capsule  08/07/18   Provider, Historical   cetirizine (ZYRTEC) 10 mg tablet  08/07/18   Provider, Historical   Vitamin D3 25 mcg (1,000 unit) tablet  08/07/18   Provider, Historical   DULoxetine (CYMBALTA) 30 mg capsule  08/07/18   Provider, Historical    DULoxetine (CYMBALTA) 60 mg capsule  08/07/18   Provider, Historical   ferrous sulfate 325 mg (65 mg iron) tablet  08/07/18   Provider, Historical   gabapentin (NEURONTIN) 600 mg tablet  08/07/18   Provider, Historical   lisinopriL (PRINIVIL, ZESTRIL) 40 mg tablet  08/04/18   Provider, Historical   pantoprazole (PROTONIX) 40 mg tablet  08/07/18   Provider, Historical   sucralfate (CARAFATE) 1 gram tablet  08/07/18   Provider, Historical   traZODone (DESYREL) 100 mg tablet  08/07/18   Provider, Historical       Review of Systems   Constitutional: Negative.    HENT: Negative.    Eyes: Negative.    Respiratory: Negative.    Cardiovascular: Negative.    Gastrointestinal: Negative.    Endocrine: Negative.    Genitourinary: Negative.    Musculoskeletal: Positive for arthralgias.   Skin: Negative.    Allergic/Immunologic: Positive for environmental allergies.   Neurological: Positive for numbness.   Hematological: Negative.    Psychiatric/Behavioral: Negative.    All other systems reviewed and are negative.  Objective:     Visit Vitals  BP (!) 153/107 (BP 1 Location: Right arm, BP Patient Position: Sitting)   Pulse 88   Temp 97.3 ??F (36.3 ??C) (Temporal)   Ht 5\' 7"  (1.702 m)   Wt 194 lb (88 kg)   BMI 30.38 kg/m??       Physical Exam  Vitals signs reviewed.   Constitutional:       Appearance: He is obese.   Cardiovascular:      Pulses:           Dorsalis pedis pulses are 2+ on the right side and 2+ on the left side.        Posterior tibial pulses are 2+ on the right side and 2+ on the left side.   Pulmonary:      Effort: Pulmonary effort is normal.   Musculoskeletal:      Right ankle: He exhibits decreased range of motion. Tenderness.      Left ankle: He exhibits decreased range of motion. Tenderness.      Right lower leg: Edema present.      Left lower leg: Edema present.      Right foot: Normal range of motion. No deformity or bunion.      Left foot: Normal range of motion. No deformity or bunion.   Feet:      Right foot:       Protective Sensation: 10 sites tested. 10 sites sensed.      Skin integrity: Skin integrity normal.      Toenail Condition: Right toenails are normal.      Left foot:      Protective Sensation: 10 sites tested. 10 sites sensed.      Skin integrity: Skin integrity normal.      Toenail Condition: Left toenails are normal.   Lymphadenopathy:      Lower Body: No right inguinal adenopathy. No left inguinal adenopathy.   Skin:     General: Skin is warm.      Capillary Refill: Capillary refill takes 2 to 3 seconds.   Neurological:      Mental Status: He is alert and oriented to person, place, and time.   Psychiatric:         Mood and Affect: Mood and affect normal.         Behavior: Behavior is cooperative.         Data Review: No results found for this or any previous visit (from the past 24 hour(s)).      Impression:       ICD-10-CM ICD-9-CM    1. Chronic pain of left ankle  M25.572 719.47 XR ANKLE LT MIN 3 V    G89.29 338.29    2. Chronic pain of right ankle  M25.571 719.47 XR ANKLE RT MIN 3 V    G89.29 338.29          Recommendation:     Patient seen and evaluated in the office  Discussed and educated patient regarding his current medical condition  A prescription was given for x-rays to be performed of both ankles.  When imaging has been performed, will discuss treatment options in more depth  Gave prescription for Voltaren 1% gel to be applied up to 4 times daily to both ankles for symptomatic relief

## 2019-01-24 NOTE — Progress Notes (Addendum)
Progress Notes by Salomon Fick, DPM at 01/24/19 1600                Author: Salomon Fick, DPM  Service: --  Author Type: Physician       Filed: 02/01/19 1543  Encounter Date: 01/24/2019  Status: Signed          Editor: Salomon Fick, DPM (Physician)                    [facility]        Subjective:              Patient is a 48 y.o. male who is being seen as a new pt for bilateral ankle pain.  Patient states she has been suffering from the pain for the past few years.  He admits to chronic sprains but  denies any acute injury.  He states the pain rises to the level of 5 out of 10.  He states the pain is a dull ache that worsens throughout the day.  He denies any radiation of the pain.  He states weightbearing exacerbates the pain.  He denies any breaks  in skin.  Denies any local/systemic signs of infection.  He states x-rays have not been performed to confirm a diagnosis.  He denies any other lower extremity complaints        Past Medical History:        Diagnosis  Date         ?  Allergies       ?  Fibromyalgia       ?  GERD (gastroesophageal reflux disease)           ?  Hypertension            Past Surgical History:         Procedure  Laterality  Date          ?  HX ORTHOPAEDIC    12/2017          left foot first digit w/ screw placed            Family History         Problem  Relation  Age of Onset          ?  Diabetes  Mother             Social History          Tobacco Use         ?  Smoking status:  Current Every Day Smoker              Packs/day:  1.00         Types:  Cigarettes         ?  Smokeless tobacco:  Never Used       Substance Use Topics         ?  Alcohol use:  Yes              Frequency:  4 or more times a week         Drinks per session:  3 or 4              Binge frequency:  Monthly          Allergies        Allergen  Reactions         ?  Percocet [Oxycodone-Acetaminophen]  Anxiety  Prior to Admission medications             Medication  Sig   Start Date  End Date  Taking?  Authorizing Provider            diclofenac (VOLTAREN) 1 % gel  Apply  to affected area four (4) times daily.  01/24/19    Yes  Anitria Andon, Jill AlexandersJustin, DPM     acyclovir (ZOVIRAX) 200 mg capsule    08/07/18      Provider, Historical     cetirizine (ZYRTEC) 10 mg tablet    08/07/18      Provider, Historical     Vitamin D3 25 mcg (1,000 unit) tablet    08/07/18      Provider, Historical     DULoxetine (CYMBALTA) 30 mg capsule    08/07/18      Provider, Historical     DULoxetine (CYMBALTA) 60 mg capsule    08/07/18      Provider, Historical     ferrous sulfate 325 mg (65 mg iron) tablet    08/07/18      Provider, Historical     gabapentin (NEURONTIN) 600 mg tablet    08/07/18      Provider, Historical     lisinopriL (PRINIVIL, ZESTRIL) 40 mg tablet    08/04/18      Provider, Historical     pantoprazole (PROTONIX) 40 mg tablet    08/07/18      Provider, Historical     sucralfate (CARAFATE) 1 gram tablet    08/07/18      Provider, Historical            traZODone (DESYREL) 100 mg tablet    08/07/18      Provider, Historical           Review of Systems    Constitutional: Negative.     HENT: Negative.     Eyes: Negative.     Respiratory: Negative.     Cardiovascular: Negative.     Gastrointestinal: Negative.     Endocrine: Negative.     Genitourinary: Negative.     Musculoskeletal: Positive for arthralgias.    Skin: Negative.     Allergic/Immunologic: Positive for environmental allergies.    Neurological: Positive for numbness.    Hematological: Negative.     Psychiatric/Behavioral: Negative.     All other systems reviewed and are negative.           Objective:        Visit Vitals      BP  (!) 153/107 (BP 1 Location: Right arm, BP Patient Position: Sitting)     Pulse  88     Temp  97.3 ??F (36.3 ??C) (Temporal)     Ht  5\' 7"  (1.702 m)     Wt  194 lb (88 kg)        BMI  30.38 kg/m??           Physical Exam   Vitals signs reviewed.   Constitutional:        Appearance: He is obese.   Cardiovascular:       Pulses:             Dorsalis pedis pulses are 2+ on the right side and 2+  on the left side.         Posterior tibial pulses are 2+ on the right side and 2+  on the left side.   Pulmonary :  Effort: Pulmonary effort is normal.    Musculoskeletal:      Right ankle: He exhibits decreased range of motion.  Tenderness.      Left ankle: He exhibits decreased range of motion.  Tenderness.      Right lower leg: Edema present.      Left lower leg:  Edema present.      Right foot: Normal range of motion. No deformity or bunion.      Left foot: Normal range of motion. No deformity or bunion.    Feet:       Right foot:       Protective Sensation: 10 sites tested. 10  sites sensed.       Skin integrity: Skin integrity normal.      Toenail Condition: Right toenails are normal.       Left foot:       Protective Sensation: 10 sites tested. 10  sites sensed.       Skin integrity: Skin integrity normal.      Toenail Condition: Left toenails are normal.    Lymphadenopathy :       Lower Body: No right inguinal adenopathy. No left inguinal adenopathy.    Skin:      General: Skin is warm.      Capillary Refill: Capillary refill takes 2 to 3 seconds.   Neurological:       Mental Status: He is alert and oriented to person, place, and time.    Psychiatric:         Mood and Affect: Mood and affect normal.         Behavior: Behavior is cooperative.             Data Review: No results found for this or any previous visit (from the past 24 hour(s)).           Impression:                  ICD-10-CM  ICD-9-CM             1.  Chronic pain of left ankle   M25.572  719.47  XR ANKLE LT MIN 3 V            G89.29  338.29             2.  Chronic pain of right ankle   M25.571  719.47  XR ANKLE RT MIN 3 V            G89.29  338.29                  Recommendation:        Patient seen and evaluated in the office   Discussed and educated patient regarding his current medical condition   A prescription was given for x-rays to be performed of both ankles.  When  imaging has been performed, will discuss treatment options in more depth   Gave prescription for Voltaren 1% gel to be applied up to 4 times daily to both ankles for symptomatic relief

## 2019-03-21 ENCOUNTER — Inpatient Hospital Stay
Admit: 2019-03-21 | Discharge: 2019-03-22 | Disposition: A | Discharge status: Home / Self Care | Payer: BLUE CROSS/BLUE SHIELD | Attending: Emergency Medicine

## 2019-03-21 ENCOUNTER — Emergency Department: Admit: 2019-03-21 | Payer: BLUE CROSS/BLUE SHIELD | Primary: Family

## 2019-03-21 DIAGNOSIS — S161XXA Strain of muscle, fascia and tendon at neck level, initial encounter: Secondary | ICD-10-CM

## 2019-03-21 MED ORDER — KETOROLAC TROMETHAMINE 60 MG/2 ML IM
60 mg/2 mL | INTRAMUSCULAR | Status: AC
Start: 2019-03-21 — End: 2019-03-21
  Administered 2019-03-21: 23:00:00 via INTRAMUSCULAR

## 2019-03-21 MED FILL — KETOROLAC TROMETHAMINE 60 MG/2 ML IM: 60 mg/2 mL | INTRAMUSCULAR | Qty: 2
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
## 2019-03-21 NOTE — ED Notes (Signed)
Patient given and verbalized understanding of all discharge, medication, and follow-up instructions.  Patient departed ED ambulatory in good condition with family member.

## 2019-03-21 NOTE — ED Notes (Signed)
Patient care and report received from Kim, RN
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
## 2019-03-30 ENCOUNTER — Ambulatory Visit: Attending: Family

## 2019-03-30 ENCOUNTER — Ambulatory Visit: Admit: 2019-03-30 | Discharge: 2019-03-30 | Payer: BLUE CROSS/BLUE SHIELD | Attending: Family | Primary: Family

## 2019-03-30 MED ORDER — METHOCARBAMOL 500 MG TAB
500 mg | ORAL_TABLET | Freq: Four times a day (QID) | ORAL | 0 refills | Status: AC
Start: 2019-03-30 — End: 2019-04-14
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
## 2019-04-03 NOTE — Telephone Encounter (Signed)
You can give him a note. A week from his appointment date.

## 2019-04-09 ENCOUNTER — Encounter

## 2019-04-09 NOTE — Progress Notes (Signed)
Please let patient know that x-rays of ankle do show some arthritis changes but left is showing defect that needs further follow up and imaging with MRI. Does he have appointment with Dr. Philligane again?

## 2019-04-09 NOTE — Telephone Encounter (Addendum)
called pt and was informed of the results. His next appt is on march 1st    ----- Message from Elmo Putt, NP sent at 04/09/2019  3:04 PM EST -----  Please let patient know that x-rays of ankle do show some arthritis changes but left is showing defect that needs further follow up and imaging with MRI. Does he have appointment with Dr. Robyne Askew again?

## 2019-04-09 NOTE — Progress Notes (Signed)
See other note

## 2019-04-09 NOTE — Progress Notes (Signed)
Please let patient know that x-rays of ankle do show some arthritis changes but left is showing defect that needs further follow up and imaging with MRI. Does he have appointment with Dr. Robyne Askew again?

## 2019-04-13 ENCOUNTER — Encounter

## 2019-04-16 ENCOUNTER — Ambulatory Visit: Attending: Podiatrist

## 2019-04-16 ENCOUNTER — Ambulatory Visit: Admit: 2019-04-16 | Discharge: 2019-04-17 | Payer: BLUE CROSS/BLUE SHIELD | Attending: Podiatrist | Primary: Family

## 2019-04-16 ENCOUNTER — Encounter: Attending: Podiatrist | Primary: Family

## 2019-04-16 DIAGNOSIS — M958 Other specified acquired deformities of musculoskeletal system: Secondary | ICD-10-CM

## 2019-04-16 MED ORDER — ACETAMINOPHEN-CODEINE 300 MG-30 MG TAB
300-30 mg | ORAL_TABLET | ORAL | 0 refills | Status: AC | PRN
Start: 2019-04-16 — End: 2019-04-19

## 2019-04-16 NOTE — Progress Notes (Signed)
1. Have you been to the ER, urgent care clinic since your last visit?  Hospitalized since your last visit?Yes Where: [facility]    2. Have you seen or consulted any other health care providers outside of the [facility] since your last visit?  Include any pap smears or colon screening. No     Chief Complaint   Patient presents with   ??? Foot Pain   ??? Other     review x-ray results

## 2019-04-16 NOTE — Progress Notes (Signed)
[facility]    Subjective:         Patient is a 49 y.o. male who is being seen as a returning pt for bilateral ankle pain.  Patient states he is gone for his x-rays and would like to discuss findings.  He continues to admit to bilateral ankle pain, left worse than right.  He states the pain rises to the level of 7 out of 10.  He states the pain is a dull ache that worsens throughout the day.  He denies any radiation of the pain.  He states weightbearing exacerbates the pain.  He denies any breaks in skin.  Denies any local/systemic signs of infection. He denies any other lower extremity complaints    Past Medical History:   Diagnosis Date   ??? Allergies    ??? Fibromyalgia    ??? GERD (gastroesophageal reflux disease)    ??? Hypertension      Past Surgical History:   Procedure Laterality Date   ??? HX ORTHOPAEDIC  12/2017    left foot first digit w/ screw placed       Family History   Problem Relation Age of Onset   ??? Diabetes Mother       Social History     Tobacco Use   ??? Smoking status: Current Every Day Smoker     Packs/day: 1.00     Types: Cigarettes   ??? Smokeless tobacco: Never Used   Substance Use Topics   ??? Alcohol use: Yes     Frequency: 4 or more times a week     Drinks per session: 3 or 4     Binge frequency: Monthly     Allergies   Allergen Reactions   ??? Percocet [Oxycodone-Acetaminophen] Anxiety     Prior to Admission medications    Medication Sig Start Date End Date Taking? Authorizing Provider   diclofenac (VOLTAREN) 1 % gel Apply  to affected area four (4) times daily. 01/24/19  Yes Itzamara Casas, Larkin Ina, DPM   cetirizine (ZYRTEC) 10 mg tablet  08/07/18  Yes Provider, Historical   Vitamin D3 25 mcg (1,000 unit) tablet  08/07/18  Yes Provider, Historical   DULoxetine (CYMBALTA) 30 mg capsule  08/07/18  Yes Provider, Historical   DULoxetine (CYMBALTA) 60 mg capsule  08/07/18  Yes Provider, Historical   ferrous sulfate 325 mg (65 mg iron) tablet  08/07/18  Yes Provider, Historical   gabapentin  (NEURONTIN) 600 mg tablet  08/07/18  Yes Provider, Historical   lisinopriL (PRINIVIL, ZESTRIL) 40 mg tablet  08/04/18  Yes Provider, Historical   pantoprazole (PROTONIX) 40 mg tablet  08/07/18  Yes Provider, Historical   traZODone (DESYREL) 100 mg tablet  08/07/18  Yes Provider, Historical       Review of Systems   Constitutional: Negative.    HENT: Negative.    Eyes: Negative.    Respiratory: Negative.    Cardiovascular: Negative.    Gastrointestinal: Negative.    Endocrine: Negative.    Genitourinary: Negative.    Musculoskeletal: Positive for arthralgias.   Skin: Negative.    Allergic/Immunologic: Positive for environmental allergies.   Neurological: Positive for numbness.   Hematological: Negative.    Psychiatric/Behavioral: Negative.    All other systems reviewed and are negative.      Objective:     Visit Vitals  BP 122/89 (BP 1 Location: Left upper arm, BP Patient Position: Sitting, BP Cuff Size: Adult)   Pulse 87   Temp 96.8 ??  F (36 ??C) (Temporal)   Ht 5' 7.5" (1.715 m)   Wt 199 lb 9.6 oz (90.5 kg)   SpO2 100%   BMI 30.80 kg/m??       Physical Exam  Vitals signs reviewed.   Constitutional:       Appearance: He is obese.   Cardiovascular:      Pulses:           Dorsalis pedis pulses are 2+ on the right side and 2+ on the left side.        Posterior tibial pulses are 2+ on the right side and 2+ on the left side.   Pulmonary:      Effort: Pulmonary effort is normal.   Musculoskeletal:      Right ankle: He exhibits decreased range of motion. Tenderness.      Left ankle: He exhibits decreased range of motion. Tenderness.      Right lower leg: Edema present.      Left lower leg: Edema present.      Right foot: Normal range of motion. No deformity or bunion.      Left foot: Normal range of motion. No deformity or bunion.   Feet:      Right foot:      Protective Sensation: 10 sites tested. 10 sites sensed.      Skin integrity: Skin integrity normal.      Toenail Condition: Right toenails are normal.      Left foot:       Protective Sensation: 10 sites tested. 10 sites sensed.      Skin integrity: Skin integrity normal.      Toenail Condition: Left toenails are normal.   Lymphadenopathy:      Lower Body: No right inguinal adenopathy. No left inguinal adenopathy.   Skin:     General: Skin is warm.      Capillary Refill: Capillary refill takes 2 to 3 seconds.   Neurological:      Mental Status: He is alert and oriented to person, place, and time.   Psychiatric:         Mood and Affect: Mood and affect normal.         Behavior: Behavior is cooperative.           Impression:     Osteochondral lesion, left talus  Primary arthritis, bilateral feet    Recommendation:     Patient seen and evaluated in the office  Discussed and educated patient regarding his current medical condition  Personally reviewed x-ray reports of bilateral ankles and discussed findings with patient  MRI ordered of the left ankle to further interrogate the osteochondral defect noted for possible surgical intervention in the future  A prescription was given for Tylenol #3 for as needed pain relief

## 2019-04-16 NOTE — Progress Notes (Signed)
1. Have you been to the ER, urgent care clinic since your last visit?  Hospitalized since your last visit?Yes Where: [facility]    2. Have you seen or consulted any other health care providers outside of the [facility] since your last visit?  Include any pap smears or colon screening. No     Chief Complaint   Patient presents with   . Foot Pain   . Other     review x-ray results

## 2019-04-16 NOTE — Progress Notes (Signed)
Progress Notes by Rosana Fret, DPM at 04/16/19 1645                Author: Rosana Fret, DPM  Service: --  Author Type: Physician       Filed: 04/17/19 1943  Encounter Date: 04/16/2019  Status: Signed          Editor: Rosana Fret, DPM (Physician)                    [facility]        Subjective:              Patient is a 49 y.o. male who is being seen as a returning pt for bilateral ankle pain.  Patient states he is gone for his x-rays and would like to discuss findings.  He continues to admit to  bilateral ankle pain, left worse than right.  He states the pain rises to the level of 7 out of 10.  He states the pain is a dull ache that worsens throughout the day.  He denies any radiation of the pain.  He states weightbearing exacerbates the pain.   He denies any breaks in skin.  Denies any local/systemic signs of infection. He denies any other lower extremity complaints        Past Medical History:        Diagnosis  Date         ?  Allergies       ?  Fibromyalgia       ?  GERD (gastroesophageal reflux disease)           ?  Hypertension            Past Surgical History:         Procedure  Laterality  Date          ?  HX ORTHOPAEDIC    12/2017          left foot first digit w/ screw placed            Family History         Problem  Relation  Age of Onset          ?  Diabetes  Mother             Social History          Tobacco Use         ?  Smoking status:  Current Every Day Smoker              Packs/day:  1.00         Types:  Cigarettes         ?  Smokeless tobacco:  Never Used       Substance Use Topics         ?  Alcohol use:  Yes              Frequency:  4 or more times a week         Drinks per session:  3 or 4              Binge frequency:  Monthly          Allergies        Allergen  Reactions         ?  Percocet [Oxycodone-Acetaminophen]  Anxiety          Prior to Admission medications  Medication  Sig  Start Date  End Date  Taking?  Authorizing  Provider            diclofenac (VOLTAREN) 1 % gel  Apply  to affected area four (4) times daily.  01/24/19    Yes  Raziel Koenigs, Larkin Ina, DPM     cetirizine (ZYRTEC) 10 mg tablet    08/07/18    Yes  Provider, Historical     Vitamin D3 25 mcg (1,000 unit) tablet    08/07/18    Yes  Provider, Historical     DULoxetine (CYMBALTA) 30 mg capsule    08/07/18    Yes  Provider, Historical     DULoxetine (CYMBALTA) 60 mg capsule    08/07/18    Yes  Provider, Historical     ferrous sulfate 325 mg (65 mg iron) tablet    08/07/18    Yes  Provider, Historical     gabapentin (NEURONTIN) 600 mg tablet    08/07/18    Yes  Provider, Historical     lisinopriL (PRINIVIL, ZESTRIL) 40 mg tablet    08/04/18    Yes  Provider, Historical     pantoprazole (PROTONIX) 40 mg tablet    08/07/18    Yes  Provider, Historical            traZODone (DESYREL) 100 mg tablet    08/07/18    Yes  Provider, Historical           Review of Systems    Constitutional: Negative.     HENT: Negative.     Eyes: Negative.     Respiratory: Negative.     Cardiovascular: Negative.     Gastrointestinal: Negative.     Endocrine: Negative.     Genitourinary: Negative.     Musculoskeletal: Positive for arthralgias.    Skin: Negative.     Allergic/Immunologic: Positive for environmental allergies.    Neurological: Positive for numbness.    Hematological: Negative.     Psychiatric/Behavioral: Negative.     All other systems reviewed and are negative.           Objective:        Visit Vitals      BP  122/89 (BP 1 Location: Left upper arm, BP Patient Position: Sitting, BP Cuff Size: Adult)     Pulse  87     Temp  96.8 ??F (36 ??C) (Temporal)     Ht  5' 7.5" (1.715 m)     Wt  199 lb 9.6 oz (90.5 kg)     SpO2  100%        BMI  30.80 kg/m??           Physical Exam   Vitals signs reviewed.   Constitutional:        Appearance: He is obese.   Cardiovascular:       Pulses:            Dorsalis pedis pulses are 2+ on the right side and 2+  on the left side.         Posterior tibial pulses are 2+ on  the right side and 2+  on the left side.   Pulmonary :       Effort: Pulmonary effort is normal.    Musculoskeletal:      Right ankle: He exhibits decreased range of motion.  Tenderness.      Left ankle: He exhibits decreased range of motion.  Tenderness.  Right lower leg: Edema present.      Left lower leg:  Edema present.      Right foot: Normal range of motion. No deformity or bunion.      Left foot: Normal range of motion. No deformity or bunion.    Feet:       Right foot:       Protective Sensation: 10 sites tested. 10  sites sensed.       Skin integrity: Skin integrity normal.      Toenail Condition: Right toenails are normal.       Left foot:       Protective Sensation: 10 sites tested. 10  sites sensed.       Skin integrity: Skin integrity normal.      Toenail Condition: Left toenails are normal.    Lymphadenopathy :       Lower Body: No right inguinal adenopathy. No left inguinal adenopathy.    Skin:      General: Skin is warm.      Capillary Refill: Capillary refill takes 2 to 3 seconds.   Neurological:       Mental Status: He is alert and oriented to person, place, and time.    Psychiatric:         Mood and Affect: Mood and affect normal.         Behavior: Behavior is cooperative.                  Impression:        Osteochondral lesion, left talus   Primary arthritis, bilateral feet        Recommendation:        Patient seen and evaluated in the office   Discussed and educated patient regarding his current medical condition   Personally reviewed x-ray reports of bilateral ankles and discussed findings with patient   MRI ordered of the left ankle to further interrogate the osteochondral defect noted for possible surgical intervention in the future   A prescription was given for Tylenol #3 for as needed pain relief

## 2019-04-19 ENCOUNTER — Encounter: Primary: Family

## 2019-04-24 ENCOUNTER — Inpatient Hospital Stay: Admit: 2019-04-24 | Payer: BLUE CROSS/BLUE SHIELD | Attending: Podiatrist | Primary: Family

## 2019-04-24 DIAGNOSIS — M958 Other specified acquired deformities of musculoskeletal system: Secondary | ICD-10-CM

## 2019-04-24 NOTE — Progress Notes (Signed)
Please let the patient know he has a large cyst inside his ankle.  This will require an extended period of nonweightbearing in a boot or possible surgical intervention.  If you would like to review the images and discuss his treatment options in person.  He can schedule an appointment as needed

## 2019-04-27 NOTE — Progress Notes (Signed)
Please let the patient know he has a large cyst inside his ankle.  This will require an extended period of nonweightbearing in a boot or possible surgical intervention.  If you would like to review the images and discuss his treatment options in person.  He can schedule an appointment as needed

## 2019-05-07 ENCOUNTER — Telehealth: Attending: Podiatrist

## 2019-05-07 ENCOUNTER — Telehealth: Admit: 2019-05-07 | Discharge: 2019-05-07 | Payer: BLUE CROSS/BLUE SHIELD | Attending: Podiatrist | Primary: Family

## 2019-05-07 DIAGNOSIS — M899 Disorder of bone, unspecified: Secondary | ICD-10-CM

## 2019-05-07 NOTE — Progress Notes (Signed)
[facility]    Subjective:         Patient is a 49 y.o. male who is being seen as a returning pt for bilateral ankle pain.  Patient states he is gone for his MRI of the left and would like to discuss findings.  He continues to admit to bilateral ankle pain, left worse than right.  He states the pain rises to the level of 7 out of 10.  He states the pain is a dull ache that worsens throughout the day.  He denies any radiation of the pain.  He states weightbearing exacerbates the pain.  He denies any breaks in skin.  Denies any local/systemic signs of infection. He denies any other lower extremity complaints    Past Medical History:   Diagnosis Date   ??? Allergies    ??? Fibromyalgia    ??? GERD (gastroesophageal reflux disease)    ??? Hypertension      Past Surgical History:   Procedure Laterality Date   ??? HX ORTHOPAEDIC  12/2017    left foot first digit w/ screw placed       Family History   Problem Relation Age of Onset   ??? Diabetes Mother       Social History     Tobacco Use   ??? Smoking status: Current Every Day Smoker     Packs/day: 1.00     Types: Cigarettes   ??? Smokeless tobacco: Never Used   Substance Use Topics   ??? Alcohol use: Yes     Frequency: 4 or more times a week     Drinks per session: 3 or 4     Binge frequency: Monthly     Allergies   Allergen Reactions   ??? Percocet [Oxycodone-Acetaminophen] Anxiety     Prior to Admission medications    Medication Sig Start Date End Date Taking? Authorizing Provider   diclofenac (VOLTAREN) 1 % gel Apply  to affected area four (4) times daily. 01/24/19   Fabiha Rougeau, Jill Alexanders, DPM   cetirizine (ZYRTEC) 10 mg tablet  08/07/18   Provider, Historical   Vitamin D3 25 mcg (1,000 unit) tablet  08/07/18   Provider, Historical   DULoxetine (CYMBALTA) 30 mg capsule  08/07/18   Provider, Historical   DULoxetine (CYMBALTA) 60 mg capsule  08/07/18   Provider, Historical   ferrous sulfate 325 mg (65 mg iron) tablet  08/07/18   Provider, Historical   gabapentin (NEURONTIN)  600 mg tablet  08/07/18   Provider, Historical   lisinopriL (PRINIVIL, ZESTRIL) 40 mg tablet  08/04/18   Provider, Historical   pantoprazole (PROTONIX) 40 mg tablet  08/07/18   Provider, Historical   traZODone (DESYREL) 100 mg tablet  08/07/18   Provider, Historical       Review of Systems   Constitutional: Negative.    HENT: Negative.    Eyes: Negative.    Respiratory: Negative.    Cardiovascular: Negative.    Gastrointestinal: Negative.    Endocrine: Negative.    Genitourinary: Negative.    Musculoskeletal: Positive for arthralgias.   Skin: Negative.    Allergic/Immunologic: Positive for environmental allergies.   Neurological: Positive for numbness.   Hematological: Negative.    Psychiatric/Behavioral: Negative.    All other systems reviewed and are negative.      Objective:     There were no vitals taken for this visit.    Physical Exam  Vitals signs reviewed.   Constitutional:  Appearance: He is obese.   Pulmonary:      Effort: Pulmonary effort is normal.   Musculoskeletal:      Right ankle: He exhibits decreased range of motion.      Left ankle: He exhibits decreased range of motion.      Right lower leg: Edema present.      Left lower leg: Edema present.      Right foot: Normal range of motion. No deformity or bunion.      Left foot: Normal range of motion. No deformity or bunion.   Feet:      Right foot:      Skin integrity: Skin integrity normal.      Toenail Condition: Right toenails are normal.      Left foot:      Skin integrity: Skin integrity normal.      Toenail Condition: Left toenails are normal.   Skin:     Coloration: Skin is not ashen or cyanotic.   Neurological:      Mental Status: He is alert and oriented to person, place, and time.   Psychiatric:         Mood and Affect: Mood and affect normal.         Behavior: Behavior is cooperative.         Diagnostics:  EXAM: MRI ANKLE LT WO CONT  ??  INDICATION: Left ankle pain and possible osteochondral lesion.  ??  COMPARISON: None.  ??  TECHNIQUE: Axial  T2 and proton density fat-saturated; coronal T2 fat-saturated;  sagittal T1, T2 fat-saturated, and spoiled gradient-echo MRI of the left ankle .  ??  CONTRAST: None.  ??  FINDINGS: Bone Marrow: Heterogeneous cystic degenerative signal in the lateral  talar dome measures 15 x 7 x 10 mm. No acute fracture or osteonecrosis.   ??  Joint fluid: Physiologic. Ganglion cyst arises from the anterior margin of the  posterior subtalar joint and courses along the lateral aspect of the talar neck.  Volume measures 2.2 x 1.2 x 1.1 cm.  ??  Tendons: intact.   ??  Muscles: Within normal limits.   ??  Lateral ligaments: intact.  ??  Deltoid and spring ligaments: intact.  ??  Sinus tarsi: Edema partially replaces the fat.  ??  Plantar fascia: Within normal limits.   ??  Articular cartilage: Heterogeneous chondral loss from the far lateral aspect of  the talar dome is at least shallow partial-thickness. Minimal talonavicular  osteoarthritis. No evidence of coalition.  ??  Soft tissue mass: None. Presumed ganglion cyst as described above in the joint  fluid section.  ??  IMPRESSION  1. 15 x 7 x 10 mm cystic osteochondral lesion in the lateral talar dome.  2. 22 mm ganglion cyst arises from the posterior subtalar joint and courses  medial to the talar neck. Adjacent sinus tarsi inflammation.  3. Intact tendons and ligaments.    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  We will proceed with 6 weeks of offloading in a cam boot  followed by progressive physical therapy.  If symptoms  persist, will move forward with surgical intervention  Patient to continue with anti-inflammatories for symptomatic relief    * This visit was performed virtually with audio and visual capabilities via [email or web address]

## 2019-05-07 NOTE — Progress Notes (Signed)
Progress Notes by Rosana Fret, DPM at 05/07/19 0945                Author: Rosana Fret, DPM  Service: --  Author Type: Physician       Filed: 05/10/19 1557  Encounter Date: 05/07/2019  Status: Signed          Editor: Rosana Fret, DPM (Physician)                    [facility]        Subjective:              Patient is a 49 y.o. male who is being seen as a returning pt for bilateral ankle pain.  Patient states he is gone for his MRI of the left and would like to discuss findings.  He continues to  admit to bilateral ankle pain, left worse than right.  He states the pain rises to the level of 7 out of 10.  He states the pain is a dull ache that worsens throughout the day.  He denies any radiation of the pain.  He states weightbearing exacerbates  the pain.  He denies any breaks in skin.  Denies any local/systemic signs of infection. He denies any other lower extremity complaints        Past Medical History:        Diagnosis  Date         ?  Allergies       ?  Fibromyalgia       ?  GERD (gastroesophageal reflux disease)           ?  Hypertension            Past Surgical History:         Procedure  Laterality  Date          ?  HX ORTHOPAEDIC    12/2017          left foot first digit w/ screw placed            Family History         Problem  Relation  Age of Onset          ?  Diabetes  Mother             Social History          Tobacco Use         ?  Smoking status:  Current Every Day Smoker              Packs/day:  1.00         Types:  Cigarettes         ?  Smokeless tobacco:  Never Used       Substance Use Topics         ?  Alcohol use:  Yes              Frequency:  4 or more times a week         Drinks per session:  3 or 4              Binge frequency:  Monthly          Allergies        Allergen  Reactions         ?  Percocet [Oxycodone-Acetaminophen]  Anxiety          Prior to Admission medications  Medication  Sig  Start Date  End Date  Taking?   Authorizing Provider            diclofenac (VOLTAREN) 1 % gel  Apply  to affected area four (4) times daily.  01/24/19      Davey Limas, Larkin Ina, DPM     cetirizine (ZYRTEC) 10 mg tablet    08/07/18      Provider, Historical     Vitamin D3 25 mcg (1,000 unit) tablet    08/07/18      Provider, Historical     DULoxetine (CYMBALTA) 30 mg capsule    08/07/18      Provider, Historical     DULoxetine (CYMBALTA) 60 mg capsule    08/07/18      Provider, Historical     ferrous sulfate 325 mg (65 mg iron) tablet    08/07/18      Provider, Historical     gabapentin (NEURONTIN) 600 mg tablet    08/07/18      Provider, Historical     lisinopriL (PRINIVIL, ZESTRIL) 40 mg tablet    08/04/18      Provider, Historical     pantoprazole (PROTONIX) 40 mg tablet    08/07/18      Provider, Historical            traZODone (DESYREL) 100 mg tablet    08/07/18      Provider, Historical           Review of Systems    Constitutional: Negative.     HENT: Negative.     Eyes: Negative.     Respiratory: Negative.     Cardiovascular: Negative.     Gastrointestinal: Negative.     Endocrine: Negative.     Genitourinary: Negative.     Musculoskeletal: Positive for arthralgias.    Skin: Negative.     Allergic/Immunologic: Positive for environmental allergies.    Neurological: Positive for numbness.    Hematological: Negative.     Psychiatric/Behavioral: Negative.     All other systems reviewed and are negative.           Objective:        There were no vitals taken for this visit.      Physical Exam   Vitals signs reviewed.   Constitutional:        Appearance: He is obese.    Pulmonary:       Effort: Pulmonary effort is normal.    Musculoskeletal:      Right ankle: He exhibits decreased range of motion.       Left ankle: He exhibits decreased range of motion.      Right lower leg:  Edema present.      Left lower leg: Edema present.      Right foot: Normal  range of motion. No deformity or bunion.      Left foot: Normal range of motion. No deformity or bunion.     Feet:       Right foot:       Skin integrity: Skin integrity normal.      Toenail Condition: Right toenails are normal.       Left foot:       Skin integrity: Skin integrity normal.      Toenail Condition: Left toenails are normal.     Skin:      Coloration: Skin is not ashen or cyanotic.   Neurological :       Mental Status: He  is alert and oriented to person, place, and time.    Psychiatric:         Mood and Affect: Mood and affect normal.         Behavior: Behavior is cooperative.             Diagnostics:   EXAM: MRI ANKLE LT WO CONT   ??   INDICATION: Left ankle pain and possible osteochondral lesion.   ??   COMPARISON: None.   ??   TECHNIQUE: Axial T2 and proton density fat-saturated; coronal T2 fat-saturated;   sagittal T1, T2 fat-saturated, and spoiled gradient-echo MRI of the left ankle .   ??   CONTRAST: None.   ??   FINDINGS: Bone Marrow: Heterogeneous cystic degenerative signal in the lateral   talar dome measures 15 x 7 x 10 mm. No acute fracture or osteonecrosis.    ??   Joint fluid: Physiologic. Ganglion cyst arises from the anterior margin of the   posterior subtalar joint and courses along the lateral aspect of the talar neck.   Volume measures 2.2 x 1.2 x 1.1 cm.   ??   Tendons: intact.    ??   Muscles: Within normal limits.    ??   Lateral ligaments: intact.   ??   Deltoid and spring ligaments: intact.   ??   Sinus tarsi: Edema partially replaces the fat.   ??   Plantar fascia: Within normal limits.    ??   Articular cartilage: Heterogeneous chondral loss from the far lateral aspect of   the talar dome is at least shallow partial-thickness. Minimal talonavicular   osteoarthritis. No evidence of coalition.   ??   Soft tissue mass: None. Presumed ganglion cyst as described above in the joint   fluid section.   ??   IMPRESSION   1. 15 x 7 x 10 mm cystic osteochondral lesion in the lateral talar dome.   2. 22 mm ganglion cyst arises from the posterior subtalar joint and courses   medial to the talar neck. Adjacent  sinus tarsi inflammation.   3. Intact tendons and ligaments.        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  We will proceed with 6 weeks of offloading in a cam boot followed by progressive physical therapy.  If symptoms persist, will move forward  with surgical intervention   Patient to continue with anti-inflammatories for symptomatic relief      * This visit was performed virtually with audio and visual capabilities via [email or web address]

## 2019-05-10 DIAGNOSIS — M899 Disorder of bone, unspecified: Secondary | ICD-10-CM

## 2019-05-14 LAB — COMPREHENSIVE METABOLIC PANEL
ALT: 33 U/L (ref 0–41)
AST: 19 U/L (ref 0–40)
Albumin/Globulin Ratio: 1.7 mmol/L (ref 1.00–2.00)
Albumin: 4.6 g/dL (ref 3.5–5.2)
Alk Phosphatase: 68 U/L (ref 40–130)
Anion Gap: 8 mmol/L (ref 2–17)
BUN: 12 mg/dL (ref 6–20)
CO2: 27 mmol/L (ref 22–29)
Calcium: 9.5 mg/dL (ref 8.6–10.0)
Chloride: 105 mmol/L (ref 98–107)
Creatinine: 1 mg/dL (ref 0.7–1.3)
GFR African American: 103 mL/min/{1.73_m2} (ref >=90)
GFR Non-African American: 89 mL/min/{1.73_m2} — ABNORMAL LOW (ref >=90)
Globulin: 2.7 g/dL (ref 1.9–4.4)
Glucose: 92 mg/dL (ref 70–99)
Osmolaliy Calculated: 279 mosm/kg (ref 270–287)
Potassium: 4.5 mmol/L (ref 3.5–5.3)
Sodium: 140 mmol/L (ref 135–145)
Total Bilirubin: 0.75 mg/dL (ref 0.00–1.20)
Total Protein: 7.3 g/dL (ref 6.4–8.3)

## 2019-05-14 LAB — SEDIMENTATION RATE: Sed Rate, Automated: 5 mm/h (ref 0–15)

## 2019-05-14 LAB — URIC ACID: Uric Acid: 6.4 mg/dL (ref 3.4–7.0)

## 2019-05-17 LAB — ANA: ANA by IFA: NEGATIVE

## 2019-06-12 NOTE — Procedures (Signed)
Procedure Record - SFPM             Procedure Record - SFPM Summary                                                                 Primary Physician:        Rondall Allegra W-MD    Case Number:              [id number]    Finalized Date/Time:      06/12/19 08:50:08    Pt. Name:                 Ronald Herrera, Ronald Herrera    D.O.B./Sex:               25-Sep-1970    Male    Med Rec #:                [id number]    Physician:                Rondall Allegra W-MD    Financial #:              [id number]    Pt. Type:                 O    Room/Bed:                 /    Admit/Disch:              06/12/19 08:09:00 -    Institution:       [facility] - Case Attendance                                                                                                    Entry 1                         Entry 2                         Entry 3                                          Case Attendee             RANDALL,  DERRICK W-MD          Gertie Baron, RN, KAY A              CHILDS,  JOHN A    Role Performed            Art gallery manager  Radiology Tech    Time In                   06/12/19 08:41:00               06/12/19 08:41:00               06/12/19 08:41:00    Time Out                  06/12/19 08:50:00               06/12/19 08:50:00               06/12/19 08:50:00    Procedure                 Cervical Epidural               Cervical Epidural               Cervical Epidural                              Steroid Injection               Steroid Injection               Steroid Injection    Last Modified By:         Gertie BaronKIERSPE, RN, Hortencia ConradiKAY A              KIERSPE, RN, Hortencia ConradiKAY A              KIERSPE, RN, KAY A                              06/12/19 08:50:06               06/12/19 08:50:06               06/12/19 08:50:06      SFPM - Case Attendance Audit                                                                     06/12/19 08:50:06         Owner: Renee RivalKIERSPEK                             Modifier: KIERSPEK                                                           1     <+> Time Out            1     <*> Procedure                              Cervical Epidural Steroid Injection            2     <+>  Time In            2     <+> Time Out            2     <*> Procedure                              Cervical Epidural Steroid Injection            3     <+> Time In            3     <+> Time Out            3     <*> Procedure                              Cervical Epidural Steroid Injection     06/12/19 08:46:43         Owner: Renee Rival                             Modifier: KIERSPEK                                                          1     <+> Time In            1     <*> Procedure                              Cervical Epidural Steroid Injection        <+> 2         Case Attendee        <+> 2         Role Performed        <+> 2         Procedure        <+> 3         Case Attendee        <+> 3         Role Performed        <+> 3         Procedure        SFPM - Case Times                                                                                                         Entry 1  Patient      In Room Time             06/12/19 08:41:00               Out Room Time                   06/12/19 08:50:00    Anesthesia     Procedure      Start Time               06/12/19 08:45:00               Stop Time                       06/12/19 08:50:00    Last Modified By:         Gertie Baron RN, KAY A                              06/12/19 08:50:05      SFPM - Case Times Audit                                                                          06/12/19 08:50:05         Owner: Renee Rival                             Modifier: KIERSPEK                                                      <+> 1         Out Room Time     06/12/19 08:50:01         Owner: Renee Rival                             Modifier: KIERSPEK                                                       <+> 1         Stop Time     06/12/19 08:45:27         Owner: Renee Rival                             Modifier: Renee Rival                                                      <+> 1         Start Time  SFPM - General Case Data                                                                                                  Entry 1                                                                                                          Case Information      ASA Class                N/A                             Case Level                      None     OR                       SF PM 02                        Specialty                       Pain Management (SN)     Wound Class              1-Clean    Preop Diagnosis           M54.12                          Postop Diagnosis                m54.12    Last Modified By:         Gertie Baron, RN, KAY A                              06/12/19 08:47:09      SFPM - Procedures  Entry 1                                                                                                          Procedure     Description      Procedure                Cervical Epidural               Surgical Procedure              CESI                              Steroid Injection               Text     Primary Procedure         Yes                             Primary Surgeon                 Rondall Allegra W-MD    Start                     06/12/19 08:45:00               Stop                            06/12/19 08:50:00    Anesthesia Type           Local                           Surgical Service                Pain Management (SN)    Wound Class               1-Clean    Last Modified By:         Gertie Baron, RN, KAY A                              06/12/19 08:50:02      SFPM - Procedures Audit                                                                          06/12/19 08:50:02         Owner: Renee Rival  Modifier: Rowe Pavy                                                      <+> 1         Stop        SFPM - Dressing/Packing                                                                                                   Entry 1                                                                                                          Site                      Neck    Dressing Item     Details      Dressing Item            Band-Aid     (Im.290)     Last Modified By:         Mylo Red, RN, KAY A                              06/12/19 08:46:59      SFPM - Procedure Setup                                                                                                    Entry 1  Body Position             Prone                           Prep Agents (Im.270)            Chlorhexidine Gluconate                                                                                              2% w/Alcohol    Skin Prep Agent Dry       Yes                             Equipment Used                  O2 Sat    Without Pooling     Vital signs               Yes    completed and     patient reassessed     prior to sedation     Last Modified By:         Gertie Baron, RN, KAY A                              06/12/19 08:47:17      SFPM - Time Out - Procedure                                                                                               Entry 1                                                                                                          Procedure                 Cervical Epidural               Patient name and                Yes  Steroid Injection               DOB confirmed     Surgical procedure        Yes                             Correct surgical                Yes    to be performed                                           site marked and     confirmed and                                              initials are     verified by                                               visible through     completed surgical                                        prepped and draped     consent                                                   field (or                                                               alternative ID band                                                               used), if applicable     Allergies discussed       Yes                             Anticoagulation                 Yes                                                              status confirmed     Time Out Complete  06/12/19 08:43:00    Last Modified By:         Gertie Baron RN, KAY A                              06/12/19 08:47:24      SFPM - Time Out - Procedure Audit                                                                06/12/19 08:47:24         Owner: Renee Rival                             Modifier: Renee Rival                                                          1     <*> Procedure                              Cervical Epidural Steroid Injection            1     <+> Allergies discussed        SFPM - Debrief - Procedure                                                                                                Entry 1                                                                                                          Procedure                 Cervical Epidural               Actual procedure                Yes                              Steroid Injection               performed confirmed  Confirm specimens         Yes                             Patient recovery                Yes    and specimens                                             plan confirmed     labeled     appropriately (if     applicable)     Debrief Complete          06/12/19 08:49:00    Last Modified By:         Gertie Baron RN, KAY A                              06/12/19 08:49:58      Case Comments                                                                                          <None>              Finalized By: Gertie Baron RN, KAY A      Document Signatures                                                                             Signed By:           Gertie Baron RN, KAY A 06/12/19 08:50
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
## 2019-06-12 NOTE — Nursing Note (Signed)
Nursing Discharge Summary - Text       Nursing Discharge Summary Entered On:  06/12/2019 7:43 EDT    Performed On:  06/12/2019 7:43 EDT by Freida Busman, RN, Solmon Ice               DC Information   Discharge To :   Home independently   Mode of Discharge :   Ambulatory   Ronald Herrera A - 06/12/2019 7:43 EDT

## 2019-06-12 NOTE — Assessment & Plan Note (Signed)
Pre-Procedure Record - [facility]             Pre-Procedure Record - [facility] Summary                                                             Primary Physician:        Rondall Allegra W-MD    Case Number:              [id number]    Finalized Date/Time:      06/12/19 08:40:36    Pt. Name:                 Ronald Herrera, Ronald Herrera    D.O.B./Sex:               1970-08-12    Male    Med Rec #:                [id number]    Physician:                Rondall Allegra W-MD    Financial #:              [id number]    Pt. Type:                 O    Room/Bed:                 /    Admit/Disch:              06/12/19 08:09:00 -    Institution:       [facility] - Pre-Procedure - Case Times                                                                                         Entry 1                                                                                                          Patient In Room Time      06/12/19 08:20:00               Nurse In Time                   06/12/19 08:20:00    Nurse Out Time            06/12/19 08:30:00               Patient Ready for  06/12/19 08:30:00                                                              Surgery/Procedure     Last Modified By:         Judithann Graves                              06/12/19 08:40:34      S[facility] - Pre-Procedure - Case Times Audit                                                          06/12/19 08:40:34         Owner: Leward Quan                                 Modifier: HAMK                                                          <+> 1         Patient Ready for Surgery/Procedure        <+> 1         Nurse Out Time                Finalized By: Judithann Graves      Document Signatures                                                                             Signed By:           Judithann Graves 06/12/19 08:40

## 2019-06-12 NOTE — Procedures (Signed)
[facility]        Patient:   Ronald Herrera, Ronald Herrera             MRN: [id number]            FIN: [phone]               Age:   49 years     Sex:  Male     DOB:  Mar 09, 1970   Associated Diagnoses:   None   Author:   Rondall Allegra W-MD      Date: 06/12/2019    DX: Cervicalgia    Referral: Malachy Moan    Level: C6-C7      After explaining the risk and benefits of the procedure and informed consent was obtained, patient was taken to the fluoroscopy suite and placed in the prone position.  Patient was prepped and draped in sterile fashion and sterility was kept throughout the procedure.  3 cc of 1% lidocaine without epinephrine was injected over the appropriate cervical level with a 27-gauge needle.  Afterwards, a 20-gauge Tuohy needle was advanced to the epidural space using a hanging drop technique.  Correct needle placement was confirmed using biplanar fluoroscopy along with injecting 2 cc of contrast dye.  After the dye showed a normal contrast flow, 10 mg of dexamethasone was injected along with 1 cc of 0.125% bupivacaine.  The needle was removed.  A Band-Aid was applied to the needle entry site.  Patient was discharged to the recovery room and finally discharged home without any complications.  Patient tolerated the procedure well.  Patient is given a follow-up appointment prior to the discharge.                                                          Signature Line     Electronically Signed on 06/12/2019 09:19 AM EDT   ________________________________________________   Rondall Allegra W-MD

## 2019-06-12 NOTE — Op Note (Signed)
Phase II Record - [facility]             Phase II Record - [facility] Summary                                                                  Primary Physician:        Rondall Allegra W-MD    Case Number:              [id number]    Finalized Date/Time:      06/12/19 09:01:30    Pt. Name:                 Ronald Herrera, Ronald Herrera    D.O.B./Sex:               [birth year]    Male    Med Rec #:                [id number]    Physician:                Rondall Allegra W-MD    Financial #:              [id number]    Pt. Type:                 O    Room/Bed:                 /    Admit/Disch:              06/12/19 08:09:00 -    Institution:       [facility] Case Time Phase II                                                                                                   Entry 1                                                                                                          Phase II In               06/12/19 08:53:00               Phase II Out                    06/12/19 09:01:00    Phase II Discharge        06/12/19 09:01:00  Time     Last Modified By:         Freida Busman, RN, Sandria Bales A                              06/12/19 09:01:29              Finalized By: Freida Busman RN, Solmon Ice      Document Signatures                                                                             Signed By:           Freida Busman, RN, Sandria Bales A 06/12/19 09:01

## 2019-06-12 NOTE — H&P (Signed)
History & Physical  Pre-Procedure        Patient:   Ronald Herrera, Ronald Herrera             MRN: [id number]            FIN: [phone]               Age:   49 years     Sex:  Male     DOB:  [birth year]   Associated Diagnoses:   None   Author:   Rondall Allegra W-MD      Date: 06/12/2019    DX: Cervicalgia    PROCEDURE: Cervical epidural steroid injection      Patient is here for a therapeutic pain procedure with fluoroscopy.    Vitals: See nursing notes    Mental status: Alert and oriented x3    Cardiovascular: Regular rate and rhythm    Lungs: Clear to auscultation bilaterally    Neuro: Nonfocal    Assessment:  Patient here for therapeutic pain procedure.     Plan:  Will plan to proceed with scheduled pain procedure        Signature Line     Electronically Signed on 06/12/2019 09:19 AM EDT   ________________________________________________   Rondall Allegra W-MD
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
## 2019-06-27 LAB — COVID-19: SARS COV2, NAA (BD): NOT DETECTED

## 2019-06-27 NOTE — Nursing Note (Signed)
Nursing Discharge Summary - Text       Physician Discharge Summary Entered On:  06/27/2019 19:15 EDT    Performed On:  06/27/2019 19:14 EDT by Reynold Bowen S-PA               DC Information   Provider Instructions for Diet :   A Healthy Diet, Regular diet   Reynold Bowen S-PA - 06/27/2019 19:14 EDT   Provider Instructions for Activity :   Other: STRICT NON WEIGHT BEARING LEFT LOWER EXTREMITY, ELEVATION ABOVE LEVEL OF HEART, ICE AS NEEDED LEFT KNEE. NO NICOTINE PRODUCTS!!!!!   Reynold Bowen S-PA - 06/28/2019 11:58 EDT     Provider Instructions for Wound Care :   Other: KEEP SPLINT CLEAN, DRY AND INTACT UNTIL FIRST POST OP VISIT   Reynold Bowen S-PA - 06/27/2019 19:14 EDT

## 2019-06-28 NOTE — Discharge Summary (Signed)
Inpatient Patient Summary                 [facility]  [address] [place], [place] [address]  [phone]  Patient Discharge Instructions     Name: Ronald Herrera, Ronald Herrera  Current Date: 06/28/2019 11:59:01  DOB: 1970-09-19 [id number] FIN:NBR%[phone]  Patient Address: [address] [place] [facility] [address]  Patient Phone: [phone]  Primary Care Provider:  Name: Radene Journey  Phone: [phone]  Immunizations Provided:      Discharge Diagnosis: Osteochondral defect of ankle  Discharged To: TO, ANTICIPATED%>  Home Treatments: TREATMENTS, ANTICIPATED%>  Devices/Equipment: EQUIPMENT REHAB%>  Post Hospital Services: HOSPITAL SERVICES%>  Professional Skilled Services: SKILLED SERVICES%>  Therapist, sports and Community Resources: SERV AND COMM RES, ANTICIPATED%>  Mode of Discharge Transportation: TRANSPORTATION%>  Discharge Orders:          Discharge Patient 06/28/19 11:33:00 EDT, Discharge Home/Self Care         Comment:   Medications  During the course of your visit, your medication list was updated with the most current information. The details of those changes are reflected below:          New Medications  [facility] [address], [address], [place] [id number], [phone] - [phone]  aspirin (aspirin 81 mg oral tablet, chewable) 1 Tabs Oral (given by mouth) 2 times a day for 45 Days. 1 tablet by mouth twice daily for dvt prophylaxis, begin in am 5/14. Refills: 0.  Last Dose:____________________  docusate (Colace 100 mg oral capsule) 1 Tabs Oral (given by mouth) 2 times a day. Refills: 1.  Last Dose:____________________  ondansetron (Zofran 4 mg oral tablet) 1 Tabs Oral (given by mouth) every 6 hours as needed as needed for nausea/vomiting for 5 Days. Refills: 1.  Last Dose:____________________  Printed Prescriptions  HYDROmorphone (HYDROmorphone 4 mg oral tablet) 1 Tabs Oral (given by mouth) every 4 hours as needed severe pain (8-10). Refills: 0.  Last  Dose:____________________  Medications that have not changed  Other Medications  acyclovir (acyclovir 200 mg oral capsule) 2 Capsules Oral (given by mouth) 2 times a day.  Last Dose:____________________  gabapentin (gabapentin 600 mg oral tablet) 1 Tabs Oral (given by mouth) 3 times a day.  Last Dose:____________________  lisinopril (lisinopril 40 mg oral tablet) 1 Tabs Oral (given by mouth) once a day (in the morning).  Last Dose:____________________  Non-Formulary Medication (DULOXETINE 90 MG) 1 Tabs Oral (given by mouth) once a day (in the morning).  Last Dose:____________________      [facility] would like to thank you for allowing [place] to assist you with your healthcare needs. The following includes patient education materials and information regarding your injury/illness.  [place], [place] has been given the following list of follow-up instructions, prescriptions, and patient education materials:  Follow-up Instructions:              With: Address: When:   f/u 10-14 days post op, call office for appt/with questions. [phone]                    It is important to always keep an active list of medications available so that you can share with other providers and manage your medications appropriately. As an additional courtesy, we are also providing you with your final active medications list that you can keep with you.           acyclovir (acyclovir 200 mg oral  capsule) 2 Capsules Oral (given by mouth) 2 times a day.  aspirin (aspirin 81 mg oral tablet, chewable) 1 Tabs Oral (given by mouth) 2 times a day for 45 Days. 1 tablet by mouth twice daily for dvt prophylaxis, begin in am 5/14. Refills: 0.  docusate (Colace 100 mg oral capsule) 1 Tabs Oral (given by mouth) 2 times a day. Refills: 1.  gabapentin (gabapentin 600 mg oral tablet) 1 Tabs Oral (given by mouth) 3 times a day.  HYDROmorphone (HYDROmorphone 4 mg oral tablet) 1 Tabs Oral (given by mouth) every 4 hours as needed severe pain (8-10).  Refills: 0.  lisinopril (lisinopril 40 mg oral tablet) 1 Tabs Oral (given by mouth) once a day (in the morning).  Non-Formulary Medication (DULOXETINE 90 MG) 1 Tabs Oral (given by mouth) once a day (in the morning).  ondansetron (Zofran 4 mg oral tablet) 1 Tabs Oral (given by mouth) every 6 hours as needed as needed for nausea/vomiting for 5 Days. Refills: 1.      Take only the medications listed above. Contact your doctor prior to taking any medications not on this list.  Discharge instructions, if any, will display below     Instructions for Diet: INSTRUCTIONS FOR DIET%>A Healthy Diet, Regular diet  Instructions for Supplements: SUPPLEMENT INSTRUCTIONS%>  Instructions for Activity: INSTRUCTIONS FOR ACTIVITY%>Other: STRICT NON WEIGHT BEARING LEFT LOWER EXTREMITY, ELEVATION ABOVE LEVEL OF HEART, ICE AS NEEDED LEFT KNEE. NO NICOTINE PRODUCTS!!!!!  Instructions for Wound Care: INSTRUCTIONS FOR WOUND CARE%>Other: KEEP SPLINT CLEAN, DRY AND INTACT UNTIL FIRST POST OP VISIT     Medication leaflets, if any, will display below    Patient education materials, if any, will display below            Crutch Walking   Crutch adjustment   Make sure the crutches you use are adjusted to fit you. When you stand, there should be room to fit 2 to 3 fingers between the top of the crutch and your armpit. Your elbow should be slightly bent when holding the hand grips. When your arms hang down, the crutch handle should be at the top of your hip.??           Crutch walking   Place the crutches forward about 1 foot in front of you. The crutches should be a little farther apart than your body. Lean your weight forward as you push down on the hand grips. Make sure your weight is on your hands and your strong leg, not your armpits. Let your body swing forward, landing on the strong leg. Move the crutches forward again. The crutch and your injured leg should move together.   Going up steps with no handrails   (Up with the good leg)     ??With  both crutches (under each armpit) on the same step as your feet, push down on the hand grips.     ??Balancing with very light pressure on the weak leg, let your hands support your weight. Raise your strong leg onto the next higher step.     ??Transfer all your weight to your strong leg (still bent). Move the crutches up to the next step, next to your strong leg.     ??Keep your weight evenly balanced on the two crutches and your strong leg. Straighten your strong knee as you raise your weak leg up to the next step.    Going down steps with no handrails   (Down with the bad leg)     ??  With both crutches (under each armpit) on the same step as your feet, push down on the hand grips.     ??Keep your weight evenly balanced on the two crutches and your strong leg. Bend your strong knee as you lower your weak leg down to the next step.     ??Let your strong leg support you (still bent) as you move the crutches down next to the weak leg.     ??Transfer your weight to your hands. Balance with very light pressure on your weak leg as you lower your strong leg next to your weak leg    Going up steps with handrails   (Up with the good leg)     ??Face the stairs, holding the handrail with one hand. Place both crutches under your armpit on the opposite side. Push down on the hand grips.     ??Balancing with very light pressure on the weak leg, let your hands support your weight. Raise your strong leg onto the next higher step.     ??Transfer all your weight to your strong leg (still bent) as you move the crutches up (while holding on to the handrail) to the next step next to the strong leg.     ??Keep your weight evenly balanced on the handrail,??the crutches (still under the same armpit opposite the handrail), and your strong leg. Straighten your strong knee as you raise the weak leg up to the next step.    Going down steps with handrails   (Down with the bad leg)     ??Face the stairs, holding the handrail with one hand. Place both crutches  under your armpit on the opposite side. Push down on the hand grips.     ??Balance your weight evenly on the crutches, handrail, and your strong leg. Then bend your strong knee as you lower the weak leg down to the next step.     ??Let the handrail and your strong leg support you (still bent) as you move the crutches down alongside the weak leg.     ??While holding on to the handrail and crutches (under the same armpit on the other side), transfer your weight to your hands, balancing with very light pressure on the weak leg as you lower your strong leg alongside your weak leg    Tip: If you are worried about falling or you feel unsteady, try sitting when going up or down stairs instead. Sit on the bottom step and keep your injured leg out in front of you. Hold your crutches flat against the stairs. Then slide up to the next step on your bottom. Use your free hand and good leg for support. Face the same way when going down stairs.      ?? 2000-2020 [employer] All rights reserved. This information is not intended as a substitute for professional medical care. Always follow your healthcare professional's instructions.                   After General Anesthesia       WHAT TO EXPECT AFTER THE PROCEDURE  After the procedure, it is typical to experience:  ???  Sleepiness.  ???  Nausea and vomiting.    HOME CARE FOR THE FIRST 24 HOURS AFTER GENERAL ANESTHESIA:     ?? Have a responsible person with you.   ?? Do not drive a car. If you are alone, do not take public transportation.  ?? Do not drink  alcohol.  ?? Do not take medicine that has not been prescribed by your health care provider.  ?? Do not sign important papers or make important decisions.  ?? You may resume a normal diet. Make your first meal light, nothing greasy, fried or spicy.  ?? You may resume activities as directed by your health care provider.  ?? If you have questions or problems that seem related to general anesthesia, call the hospital and ask for the  anesthetist or anesthesiologist on call.     SEEK MEDICAL CARE IF:  ???  You have nausea and vomiting that continue the day after anesthesia.  ???  You develop a rash.  ???  You have difficulty breathing.   ???  You have chest pain.  ???  You have any allergic problems.  ???  You can't urinate for 6 hours.    This information is not intended to replace advice given to you by your health care provider. Make sure you discuss any questions you have with your health care provider.    Document Released: 05/10/2000 Document Revised: 02/22/2014 Document Reviewed: 08/17/2012  ExitCare?? Patient Information ??2016 [place], [place].               IS IT A STROKE?  Act FAST and Check for these signs:    FACE                          Does the face look uneven?    ARM                          Does one arm drift down?    SPEECH                     Does their speech sound strange?    TIME                          Call 9-1-1 at any sign of stroke  Heart Attack Signs  Chest discomfort: Most heart attacks involve discomfort in the center of the chest and lasts more than a few minutes, or goes away and comes back. It can feel like uncomfortable pressure, squeezing, fullness or pain.  Discomfort in upper body: Symptoms can include pain or discomfort in one or both arms, back, neck, jaw or stomach.  Shortness of breath: With or without discomfort.  Other signs: Breaking out in a cold sweat, nausea, or lightheaded.  Remember, MINUTES DO MATTER. If you experience any of these heart attack warning signs, call 9-1-1 to get immediate medical attention!     ---------------------------------------------------------------------------------------------------------------------  [facility] allows you to manage your health, view your test results, and retrieve your discharge documents from your hospital stay securely and conveniently from your computer.  To begin the enrollment process, visit [email or web address] Click on ???Sign up now??? under [facility].

## 2019-06-28 NOTE — Assessment & Plan Note (Signed)
PreOp Record - [facility]             PreOp Record - [facility] Summary                                                                     Primary Physician:        Blair Hailey    Case Number:              [id number]    Finalized Date/Time:      06/28/19 08:33:47    Pt. Name:                 Ronald Herrera, Ronald Herrera    D.O.B./Sex:               [birth year]    Male    Med Rec #:                [id number]    Physician:                Blair Hailey    Financial #:              [id number]    Pt. Type:                 S    Room/Bed:                 /    Admit/Disch:              06/28/19 05:45:00 -    Institution:       [facility] Case Attendance - Preop                                                                                              Entry 1                         Entry 2                                                                          Case Attendee             Ida Rogue, RN, Nobie Putnam    Role Performed            Surgeon Primary                 Preoperative Nurse    Time In     Time Out     Last  Modified By:         Debbra Riding, RN, Dorena Dew, RNMegan Mans                              06/28/19 07:10:29               06/28/19 07:10:29      [facility] - Case Times - PreOp                                                                                                 Entry 1                                                                                                          Patient In Room Time      06/28/19 07:15:00               Nurse In Time                   06/28/19 07:15:00    Nurse Out Time            06/28/19 07:50:00               Patient Ready for               06/28/19 07:50:00                                                              Surgery/Procedure     Last Modified By:         Delana Meyer                              06/28/19 08:33:41      [facility] - Case Times - PreOp Audit                                                                  06/28/19 08:33:41          Owner: Leroy Libman  Modifier: [email or web address]                                                        <+> 1         Patient Ready for Surgery/Procedure        <+> 1         Nurse Out Time                Finalized By: Delana Meyer      Document Signatures                                                                             Signed By:           Debbra Riding RN, Megan Mans 06/28/19 08:33

## 2019-06-28 NOTE — Op Note (Signed)
Phase II Record - [facility]             Phase II Record - [facility] Summary                                                                  Primary Physician:        Marcine Matar    Case Number:              [id number]    Finalized Date/Time:      06/28/19 12:58:48    Pt. Name:                 Ronald Herrera, Ronald Herrera    D.O.B./Sex:               11/29/1970    Male    Med Rec #:                [id number]    Physician:                Marcine Matar    Financial #:              [id number]    Pt. Type:                 S    Room/Bed:                 /    Admit/Disch:              06/28/19 05:45:00 -    Institution:       [facility] Case Attendance - Phase II                                                                                           Entry 1                         Entry 2                                                                          Case Attendee             Marcine Matar               Varricchio, RN, Oswaldo Done  P    Role Performed            Surgeon Primary                 Post Anesthesia Care                                                              Nurse    Time In     Time Out     Last Modified By:         Varricchio, RN, Garry Heater, RN, Aleatha Borer 06/28/19 12:53:46             P 06/28/19 12:53:46      [facility] - Case Times - Phase II                                                                                              Entry 1                                                                                                          Phase II In               06/28/19 12:28:00               Phase II Out                    06/28/19 12:58:00    Phase II Discharge        06/28/19 12:58:00    Time     Last Modified By:         Horton Finer RNAleatha Borer 06/28/19 12:58:46      [facility] - Case Times - Phase II Audit  06/28/19 12:58:46         Owner: [id number]                              Modifier: [id number]                                                       <+> 1         Phase II Out        <+> 1         Phase II Discharge Time                Finalized By: Ladell Heads, RN, Selinda Orion      Document Signatures                                                                             Signed By:           Ladell Heads RN, Selinda Orion 06/28/19 12:58

## 2019-06-28 NOTE — Procedures (Signed)
IntraOp Record - [facility]             IntraOp Record - [facility] Summary                                                                   Primary Physician:        Blair Hailey    Case Number:              [id number]    Finalized Date/Time:      06/28/19 11:51:50    Pt. Name:                 Ronald Herrera, Ronald Herrera    D.O.B./Sex:               31-Aug-1970    Male    Med Rec #:                [id number]    Physician:                Blair Hailey    Financial #:              [id number]    Pt. Type:                 S    Room/Bed:                 /    Admit/Disch:              06/28/19 05:45:00 -    Institution:       [facility] - Case Times                                                                                                         Entry 1                                                                                                          Patient      In Room Time             06/28/19 09:44:00               Out Room Time                   06/28/19 11:46:00    Anesthesia     Procedure  Start Time               06/28/19 10:26:00               Stop Time                       06/28/19 11:44:00    Last Modified By:         FREE, RNHeide Spark                              06/28/19 11:46:38      [facility] - Case Times Audit                                                                          06/28/19 11:46:38         Owner: Olive Bass                               Modifier: FREELA                                                        <+> 1         Out Room Time     06/28/19 11:44:45         Owner: Olive Bass                               Modifier: FREELA                                                        <+> 1         Stop Time     06/28/19 10:28:25         Owner: Olive Bass                               Modifier: Olive Bass                                                        <+> 1         Start Time        [facility] - Case Attendance  Entry 1                         Entry 2                         Entry 3                                          Case Attendee             BARTLETT,  Wyn Quaker,  BLAKE-MD               FREE, RN, Marrianne Mood    Role Performed            Anesthesiologist                Surgeon Primary                 Circulator    Time In                   06/28/19 09:44:00               06/28/19 09:44:00               06/28/19 09:44:00    Time Out                  06/28/19 11:46:00               06/28/19 11:46:00               06/28/19 11:46:00    Procedure                 Tibia Osteotomy                 Tibia Osteotomy                 Tibia Osteotomy                              SCIP(Left)                      SCIP(Left)                      SCIP(Left)    Last Modified By:         FREE, RN, Heide Spark              FREE, RN, Heide Spark              FREE, RN, LAURIE B                              06/28/19 11:46:41               06/28/19 11:46:41               06/28/19 11:46:41  Entry 4                         Entry 5                                                                          Case Attendee             Juanito Doom, RN, Barbette Reichmann,  [place] S-PA    Role Performed            Surgical Scrub                  Other Authorized                                                              Personnel    Time In                   06/28/19 09:44:00               06/28/19 09:44:00    Time Out                  06/28/19 11:46:00               06/28/19 11:46:00    Procedure                 Tibia Osteotomy                 Tibia Osteotomy                              SCIP(Left)                      SCIP(Left)    Last Modified By:         FREE, RN, Heide Spark              FREE, RN, Heide Spark                              06/28/19 11:46:41               06/28/19 11:46:41      [facility] - Case Attendance Audit                                                                      06/28/19 11:46:41         Owner: Olive Bass  Modifier: FREELA                                                            1     <+> Time In            1     <+> Time Out            1     <*> Procedure                              Tibia Osteotomy SCIP(Left)            2     <+> Time In            2     <+> Time Out            2     <*> Procedure                              Tibia Osteotomy SCIP(Left)            3     <+> Time In            3     <+> Time Out            3     <*> Procedure                              Tibia Osteotomy SCIP(Left)            4     <+> Time In            4     <+> Time Out            4     <*> Procedure                              Tibia Osteotomy SCIP(Left)            5     <+> Time In            5     <+> Time Out            5     <*> Procedure                              Tibia Osteotomy SCIP(Left)     06/28/19 10:05:25         Owner: Olive Bass                               Modifier: FREELA                                                        <+> 3  Case Attendee        <+> 3         Role Performed        <+> 3         Procedure        <+> 4         Case Attendee        <+> 4         Role Performed        <+> 4         Procedure        <+> 5         Case Attendee        <+> 5         Role Performed        <+> 5         Procedure        [facility] - Skin Assessment                                                                          Pre-Care Text:            A.240 Assesses baseline skin condition  Im.120 Implements protective measures to prevent skin or tissue injury           due to mechanical sources   Im.280.1 Implements progective measures to prevent skin or tissue injury due to           thermal sources  Im.360 Monitors for signs and symptons of infection                              Entry 1                                                                                                          Skin Integrity            Intact    Last Modified  By:         FREE, RN, Margarita Grizzle B                              06/28/19 10:11:35    Post-Care Text:            E.10 Evaluates for signs and symptoms of physical injury to skin and tissue  E.270 Evaluate tissue perfusion           O.60 Patient is free from signs and symptoms of injury caused by extraneous objects    O.210 Patinet's tissue           perfusion is consistent with or improved from baseline levels  [facility] - Patient Positioning                                                                      Pre-Care Text:            A.240 Assesses baseline skin condition  A.280 Identifies baseline musculoskeletal status  A.280.1 Identifies           physical alterations that require additional precautions for procedure-specific positioning  A.510.8 Maintains           patient's dignity and privacy  Im.120 Implements protective measures to prevent skin/tissue injury due to           mechanical sources  Im.40 Positions the patient  Im.80 Applies safety devices                              Entry 1                                                                                                          Procedure                 Tibia Osteotomy                 Body Position                   Supine                              SCIP(Left)    Left Arm Position         Extended on Padded Arm          Right Arm Position              Extended on Padded Arm                              Board w/Security Strap                                          Board w/Security Strap    Left Leg Position         Extended, Held on Field         Right Leg Position              Extended    Feet Uncrossed            Yes  Pressure Points                 Yes                                                              Checked     Positioning Device        Foam Padding, Pillow,           Positioned By                   [employer],  ROBERT                              Safety Strap, Gel Pad,                                           JAMES-MD, FREE, RN,                              Rolled Blanket                                                  Merrily Brittle, RN,                                                                                              Kelly    Outcome Met (O.80)        Yes    Last Modified By:         FREE, RN, Margarita Grizzle B                              06/28/19 10:15:20    Post-Care Text:            E.10 Evaluates for signs and symptoms of physical injury to skin and tissue  E.290 Evaluates musculoskeletal           status  O.80 Patient is free from signs and symptoms of injury related to positioning  O.120 the patient is           free from signs and symptoms of injury related to transfer/transport   O.250 Patient's musculoskeletal status           is maintained at or improved from baseline levels      [facility] - Skin Prep  Pre-Care Text:            A.30 Verifies allergies  A.20 Verifies procedure, surgical site, and laterality  A.510.8 Maintains paritnet's           dignity and privacy  Im.270 Performs Skin Preparation  Im.270.1 Implements protective measures to prevent skin           and tissue injury due to chemical sources   A.300.1 Protects from cross-contamination                              Entry 1                                                                                                          Hair Removal     Skin Prep      Prep Agents (Im.270)     Chlorhexidine Gluconate         Prep Area (Im.270)              Leg Foot Left                              2% w/Alcohol     Prep Area Details        Left                            Prep By                         In[employer] [place] S-PA    Outcome Met (O.100)       Yes    Last Modified By:         FREE, RN, Margarita Grizzle B                              06/28/19 10:12:04    Post-Care Text:            E.10 Evaluates for signs and symptoms of physical injury to skin and tissue  O.100 Patient is free from  signs           and symptoms of chemical injury   O.740 The patient's right to privacy is maintained      [facility] - Counts Initial and Final                                                                 Pre-Care Text:            A.57 Verifies operative procedure, sugical site, and laterality  A.20.2 Assesses the risk for unintended  retained foreign body  Im.20 Performs required counts                              Entry 1                                                                                                          Initial Counts      Initial Counts           FREE, RN, LAURIE B,             Items included in               Sponges, Sharps     Performed By             Juanito Doom, RN, Claiborne Billings              the Initial Count     Final Counts      Final Counts             FREE, RN, Heide Spark,             Final Count Status              Correct     Performed By             Juanito Doom RN, Kelly     Items Included in        Sponges, Sharps     Final Count     Outcome Met (O.20)        Yes    Last Modified By:         FREE, RN, Margarita Grizzle B                              06/28/19 11:27:15    Post-Care Text:            E.50 Evaluates results of the surgical count  O.20 Patient is free from unintended retained foreign objects      [facility] - Counts Initial and Final Audit                                                            06/28/19 11:27:15         Owner: Olive Bass                               Modifier: FREELA                                                        <+>  1         Final Count Status        [facility] - Counts Additional                                                                        Pre-Care Text:            A.68 Verifies operative procedure, sugical site, and laterality  A.20.2 Assesses the risk for unintended           retained foreign body  Im.20 Performs required counts                              Entry 1                                                                                                           Additional Count          Closing Count                   Additional Count                FREE, RN, Heide Spark,    Type                                                      Participants                    Gambino, RN, [employer]    Count Status              Correct                         Items Counted                   Sponges, Sharps    Outcome Met (O.20)        Yes    Last Modified By:         FREE, RN, Heide Spark                              06/28/19 11:27:23    Post-Care Text:            E.50 Evaluates results of the surgical count  O.20 Patient is free from unintended retained foreign objects      [facility] - Counts Additional Audit  06/28/19 11:27:23         Owner: Olive Bass                               Modifier: FREELA                                                        <+> 1         Count Status        [facility] - General Case Data                                                                        Pre-Care Text:            A.350.1 Classifies surgical wound                              Entry 1                                                                                                          Case Information      ASA Class                2                               Case Level                      Level 3     OR                       MP 01                           Specialty                       Orthopedic (SN)     Wound Class              1-Clean    Preop Diagnosis           M93.272 M25.372                 Postop Diagnosis                M93.272 M25.372    Last Modified By:         FREE, RN, Heide Spark  06/28/19 10:09:25    Post-Care Text:            O.760 Patient receives consistent and comparable care regardless of the setting      [facility] - Fire Risk Assessment                                                                                               Entry 1                                                                                                           Fire Risk                 Alcohol Based Prep              Fire Risk Score                 2    Assessment: If            Solution, Ignition    checked, checkmark        Source In Use    = 1 point     Last Modified By:         FREE, RN, Margarita Grizzle B                              06/28/19 10:10:46      [facility] - Time Out                                                                                 Pre-Care Text:            A.10 Confirms patient identity  A.20 Verifies operative procedure, surgical site, and laterality  A.20.1           Verifies consent for planned procedure  A.30 Verifies allergies                              Entry 1  Procedure                 Tibia Osteotomy                 Is everyone ready               Yes                              SCIP(Left)                      to perform time out     Have all members of       Yes                             Patient name and                Yes    the surgical team                                         DOB confirmed     been introduced     Allergies discussed       Yes                             Surgical procedure              Yes                                                              to be performed                                                               confirmed and                                                               verified by                                                               completed surgical                                                               consent  Correct surgical          Yes                             Correct laterality              Yes    site marked and                                           confirmed     initials are     visible through     prepped and draped     field (or     alternative ID band     used)     Correct patient           Yes                              Surgeon shares                  Yes    position confirmed                                        operative plan,                                                               possible                                                               difficulties,                                                               expected duration,                                                               anticipated blood                                                               loss and reviews  all                                                               critical/specific                                                               concerns     Required blood            Yes                             Essential imaging               Yes    products, implants,                                       available and fetal     devices and/or                                            heartones confirmed     special equipment                                         (if applicable)     available and     sterility confirmed     VTE prophylaxis           Yes                             Antibiotics ordered             Yes    addressed                                                 and administered     Anesthesia shares         Yes                             Fire risk                       Yes    anesthetic plan and                                       assessment scored     reviews patient  and plan discussed     specific concerns     Appropriate drying        Yes                             Surgeon states:                 Yes    time for prep                                             Does anyone have     observed before                                           any concerns? If     draping                                                   you see, suspect,                                                                or feel that                                                               patient care is                                                               being compromised,                                                               speak up for                                                               patient safety     Time Out Complete         06/28/19 10:25:00    Last Modified By:         FREE, RN, Heide Spark  06/28/19 10:28:03    Post-Care Text:            E.30 Evaluates verification process for correct patient, site, side, and level surgery      [facility] - Time Out Audit                                                                            06/28/19 10:28:03         Owner: Olive Bass                               Modifier: FREELA                                                            1     <*> Procedure                              Tibia Osteotomy SCIP(Left)            1     <+> Time Out Complete        [facility] - Debrief                                                                                  Pre-Care Text:            Im.330 Manages specimen handling and disposition                              Entry 1                                                                                                          Procedure                 Tibia Osteotomy                 Final counts                    Yes  SCIP(Left)                      correct and                                                               verbally verified                                                               with                                                               surgeon/licensed                                                               independent                                                               practitioner (if                                                               applicable)     Actual procedure          Yes                              Postop diagnosis                Yes    performed confirmed                                       confirmed     Wound                     Yes                             Confirm specimens               Yes    classification  and specimens     confirmed                                                 labeled                                                               appropriately (if                                                               applicable)     Foley catheter            Yes                             Patient recovery                Yes    removed (if                                               plan confirmed     applicable)     Debrief Complete          06/28/19 11:27:00    Last Modified By:         FREE, RN, Heide Spark                              06/28/19 11:27:29    Post-Care Text:            E.800 Ensures continuity of care  E.50 Evaluates results of the surgical count  O.30 Patient's procedure is           performed on the correct site, side, and level  O.50 patient's current status is communicated throughout the           continuum of care  O.40 Patient's specimen(s) is managed in the appropriate manner      [facility] - Debrief Audit                                                                             06/28/19 11:27:29         Owner: Olive Bass                               Modifier: Olive Bass  1     <*> Procedure                              Tibia Osteotomy SCIP(Left)            1     <+> Debrief Complete        [facility] - Cautery                                                                                  Pre-Care Text:            A.240 Assesses baseline skin condition  A280.1 Identifies baseline musculoskeletal status  Im.50 Implements           protective measures to prevent injury due to electrical sources   Im.60 Uses supplies and equipment within safe           parameters   Im.80 Applies safety devices                              Entry 1                                                                                                          ESU Type                  GENERATOR                       Identification                  [id number]                              [facility]              Number     Coag Setting (watts)      40                              Cut Setting (watts)             40    Grounding Pad             Yes                             Grounding Pad Site              Thigh, right    Needed?     Grounding Pad  FREE, RN, Heide Spark              ESU Comment                     [id number]    Applied By     Outcome Met (O.10)        Yes    Last Modified By:         FREE, RN, Heide Spark                              06/28/19 10:27:45    Post-Care Text:            E.10 Evaluates for signs and symptoms of physical injury to skin and tissue  O.10 Patient is free from signs           and symptoms of injury related to thermal sources   O.70 Patient is free from signs and symptoms of electrical           injury      [facility] - Patient Care Devices                                                                     Pre-Care Text:            A.200 Assesses risk for normothermia regulation  A.40 Verifies presence of prosthetics or corrective devices           Im.280 Implements thermoregulation measures  Im.60 Uses supplies and equipment within safe parameters                              Entry 1                         Entry 2                                                                          Equipment Type            MACHINE SEQUENTIAL              BAIR HUGGER                              COMPRESSION    SCD Sleeve Site           Leg Right    Equipment/Tag Number      [id number]                           [id number]    Initiated Pre             Yes    Induction     Last Modified By:         FREE, RN,  [place], RNHeide Spark                              06/28/19  10:16:23               06/28/19 10:16:23    Post-Care Text:            E.10 Evaluates signs and symptoms of physical injury to skin and tissue  O.60 Patient is free from signs and           symptoms of injury caused by extraneous objects      [facility] - Tourniquet                                                                               Pre-Care Text:            A.66 Verifies procedure, surgical site, and laterality  A.220.2 Identifies baseline tissue perfusion  A.240           Assesses baseline skin condition  A.40 Verifies presence of prosthetics or corrective devices  Im.120           Implements protective measures to prevent skin or tissue injury due to mechanical sources   Im.60 Uses supplies           and equipement within safe parameters  Im.80 Applies safety devices                              Entry 1                                                                                                          Tourniquet Type           TOURNIQUET PNEUMATIC            Serial Number                   [id number]    Setting                   300 mmHg                        Placement                       Leg Upper Left    Padding (Im.120)          Yes    Tourniquet Times      Inflated  06/28/19 10:25:00               Deflated                        06/28/19 11:39:00    Applied By                Inge Rise S-PA                Outcome Met (O.60)              Yes    Last Modified By:         FREE, RN, Heide Spark                              06/28/19 11:51:48    Post-Care Text:            E.10 Evaluates for signs and symptoms of physical injury to skin and tissue  E. 270 Evaluates tissue perfusion           O.210 Patient's tissue perfusion is consisent with or improved from baseline levels  O.30 Patient's procedure           is performed on the correct site, side, level  O.60 Patient is free from sign and symptoms of injury caused by           extraneous objects      MPOR - Tourniquet Audit                                                                           06/28/19 11:51:48         Owner: Olive Bass                               Modifier: FREELA                                                            1     <*> Tourniquet Type                        TOURNIQUET PNEUMATIC            1     <+> Deflated     06/28/19 10:28:14         Owner: Olive Bass                               Modifier: FREELA                                                            1     <*>  Tourniquet Type                        TOURNIQUET PNEUMATIC            1     <+> Inflated        MPOR - Dressing/Packing                                                                         Pre-Care Text:            A.350 Assesses susceptibility for infection  Im.250 Administers care to invasive devices  Im.290 Administer           care to wound sites   Im.300 Implements aseptic technique                              Entry 1                                                                                                          Site                      Ankle                           Site Details                    Left    Dressing Item     Details      Dressing Item            Medicated Gauze, 4x4's,         Cast/Splint (Im.290)            Plaster Splint     (Im.290)                 Cotton Batting, Self                              Adherent Wrap    Last Modified By:         FREE, RN, Margarita Grizzle B                              06/28/19 10:26:50    Post-Care Text:            E.320 Evaluate factors associted with increased risk for postoperative infection at the completion of the           procedure  O.200 Patient's wound perfusion is consistent with or improved from baseline levels   O.Patient is  free from signs and symptoms of infection      MPOR - Procedures                                                                               Pre-Care Text:            A.20 Verifies operative procedure, surgical site, and laterality  Im.150 Develops individualized  plan of care                              Entry 1                                                                                                          Procedure     Description      Procedure                Tibia Osteotomy SCIP            Modifiers                       Left     Surgical Procedure       MOSIACPLASTY LEFT TALUS     Text                     / GRAFT                              OSTEOCHONDROPLASTY LEFT                              KNEE FIBULA OSTEOTOMY,                              left ankle modified                              brostrom    Primary Procedure         Yes                             Primary Surgeon                 Blair Hailey    Start                     06/28/19 10:26:00               Stop  06/28/19 11:44:00    Anesthesia Type           General                         Surgical Service                Orthopedic (SN)    Wound Class               1-Clean    Last Modified By:         FREE, RN, Margarita Grizzle B                              06/28/19 11:44:47    Post-Care Text:            O.730 The patinet's care is consistent with the individualized perioperative plan of care      MPOR - Procedures Audit                                                                          06/28/19 11:44:47         Owner: Olive Bass                               Modifier: FREELA                                                        <+> 1         Stop     06/28/19 10:49:14         Owner: Olive Bass                               Modifier: FREELA                                                            1     <*> Procedure                              Tibia Osteotomy SCIP            1     <+> Start            1     <*> Surgical Procedure Text                MOSIACPLASTY LEFT TALUS / GRAFT OSTEOCHONDROPLASTY LEFT KNEE  FIBULA OSTEOTOMY        MPOR - Transfer                                                                                                            Entry 1                                                                                                          Transferred By            FREE, RN, Heide Spark,             Via                             Stretcher                              EDEN,  KATE S-PA    Post-op Destination       PACU    Skin Assessment      Condition                Intact    Last Modified By:         FREE, RN, Heide Spark                              06/28/19 10:28:44      Case Comments                                                                                         <None>              Finalized By: FREE, RN, Heide Spark      Document Signatures  Signed By:           Sanjuana Letters RN, Margarita Grizzle B 06/28/19 11:51
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
## 2019-06-28 NOTE — Nursing Note (Signed)
Nursing Discharge Summary - Text       Nursing Discharge Summary Entered On:  06/28/2019 12:10 EDT    Performed On:  06/28/2019 12:09 EDT by Ladell Heads, RN, Selinda Orion               DC Information   Discharge To :   Home independently   Mode of Discharge :   Wheelchair   Transportation :   Private vehicle   Accompanied By :   Neville Route, RN, Selinda Orion - 06/28/2019 12:09 EDT
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
## 2019-06-28 NOTE — Case Communication (Signed)
Discharge Follow-Up Form - Text       Discharge Follow-Up Entered On:  06/29/2019 12:43 EDT    Performed On:  06/29/2019 12:42 EDT by Nelva Bush RN, Charlsie Merles               Clinical   Provider Follow-Up Post Discharge RTF :   Follow-Up Appointments    With:  f/u 10-14 days post op, call office for appt/with questions. [phone]  Address:  , [place] [place] - 06/29/2019 12:42 EDT   Surgery Evaluation   CM Surg DC Instr Clr :   Yes   CM Surg Pain Controlled :   No   CM Surg Nausea/Vomiting :   No   CM Surg FU Appt :   Yes   CM Surg Staff Recognize :   No   Nelva Bush RNCharlsie Merles - 06/29/2019 12:42 EDT   Status   Case Status :   Completed   Nelva Bush, RN, Wynona Canes A - 06/29/2019 12:42 EDT

## 2019-06-28 NOTE — Op Note (Signed)
Phase I Record - [facility]OR             Phase I Record - [facility] Summary                                                                   Primary Physician:        Blair Hailey    Case Number:              [id number]    Finalized Date/Time:      06/28/19 12:54:33    Pt. Name:                 Ronald Herrera, Ronald Herrera    D.O.B./Sex:               11/16/1970    Male    Med Rec #:                [id number]    Physician:                Blair Hailey    Financial #:              [id number]    Pt. Type:                 S    Room/Bed:                 /    Admit/Disch:              06/28/19 05:45:00 -    Institution:       [facility] Case Attendance - Phase I                                                                                            Entry 1                                                                                                          Case Attendee             Varricchio, RN, Vincent         Role Performed                  Post Anesthesia Care                              P  Nurse    Last Modified By:         Ladell Heads RN, Selinda Orion 06/28/19 12:53:54      MP[facility] - Case Times - Phase I                                                                                               Entry 1                                                                                                          Phase I In                06/28/19 11:47:00               Phase I Out                     06/28/19 12:28:00    Phase I Discharge         06/28/19 12:28:00    Time     Last Modified By:         Ladell Heads RN, Selinda Orion 06/28/19 12:54:24              Finalized By: Ladell Heads, RN, Selinda Orion      Document Signatures                                                                             Signed By:           Ladell Heads RN, Selinda Orion 06/28/19 12:54

## 2019-06-28 NOTE — Discharge Summary (Signed)
Inpatient Clinical Summary             [facility]  Post-Acute Care Transfer Instructions  PERSON INFORMATION   Name: BYRNE, CAPEK  MRN: [id number]    FIN#: [id number]   PHYSICIANS  Admitting Physician: Marcine Matar  Attending Physician: Marcine Matar   PCP: Waynetta Sandy BENTON-MD  Discharge Diagnosis:  Osteochondral defect of ankle  Comment:       PATIENT EDUCATION INFORMATION  Instructions:             Crutch Walking; General Anesth (APPIKI)  Medication Leaflets:               Follow-up:                           With: Address: When:   f/u 10-14 days post op, call office for appt/with questions. [phone]                               MEDICATION LIST  Medication Reconciliation at Discharge:          [facility] Medications  [facility] [address], [address] [place] [id number], [phone]  aspirin (aspirin 81 mg oral tablet, chewable) 1 Tabs Oral (given by mouth) 2 times a day for 45 Days. 1 tablet by mouth twice daily for dvt prophylaxis, begin in am 5/14. Refills: 0.  Last Dose:____________________  docusate (Colace 100 mg oral capsule) 1 Tabs Oral (given by mouth) 2 times a day. Refills: 1.  Last Dose:____________________  ondansetron (Zofran 4 mg oral tablet) 1 Tabs Oral (given by mouth) every 6 hours as needed as needed for nausea/vomiting for 5 Days. Refills: 1.  Last Dose:____________________  Printed Prescriptions  HYDROmorphone (HYDROmorphone 4 mg oral tablet) 1 Tabs Oral (given by mouth) every 4 hours as needed severe pain (8-10). Refills: 0.  Last Dose:____________________  Medications that have not changed  Other Medications  acyclovir (acyclovir 200 mg oral capsule) 2 Capsules Oral (given by mouth) 2 times a day.  Last Dose:____________________  gabapentin (gabapentin 600 mg oral tablet) 1 Tabs Oral (given by mouth) 3 times a day.  Last Dose:____________________  lisinopril (lisinopril 40 mg oral tablet) 1 Tabs Oral (given by mouth) once a day (in the  morning).  Last Dose:____________________  Non-Formulary Medication (DULOXETINE 90 MG) 1 Tabs Oral (given by mouth) once a day (in the morning).  Last Dose:____________________         Patient???s Final Home Medication List Upon Discharge:           acyclovir (acyclovir 200 mg oral capsule) 2 Capsules Oral (given by mouth) 2 times a day.  aspirin (aspirin 81 mg oral tablet, chewable) 1 Tabs Oral (given by mouth) 2 times a day for 45 Days. 1 tablet by mouth twice daily for dvt prophylaxis, begin in am 5/14. Refills: 0.  docusate (Colace 100 mg oral capsule) 1 Tabs Oral (given by mouth) 2 times a day. Refills: 1.  gabapentin (gabapentin 600 mg oral tablet) 1 Tabs Oral (given by mouth) 3 times a day.  HYDROmorphone (HYDROmorphone 4 mg oral tablet) 1 Tabs Oral (given by mouth) every 4 hours as needed severe pain (8-10). Refills: 0.  lisinopril (lisinopril 40 mg oral tablet) 1 Tabs Oral (given by mouth) once a day (in the morning).  Non-Formulary Medication (DULOXETINE 90 MG) 1 Tabs Oral (  given by mouth) once a day (in the morning).  ondansetron (Zofran 4 mg oral tablet) 1 Tabs Oral (given by mouth) every 6 hours as needed as needed for nausea/vomiting for 5 Days. Refills: 1.         Comment:       ORDERS          Order Name Order Details   Discharge Patient 06/28/19 11:33:00 EDT, Discharge Home/Self Care

## 2019-06-28 NOTE — Anesthesia Post-Procedure Evaluation (Signed)
Postanesthesia Evaluation Routine No O2        PatientSARATH, Ronald Herrera             MRN: [id number]            FIN: [id number]               Age:   49 years     Sex:  Male     DOB:  [birth year]   Associated Diagnoses:   None   Author:   Anders Grant JAMES-MD      Postoperative Information   Post Operative Info:          Post operative day: Post Anesthesia Care Unit.         Patient location: PACU.       Assessment   Postanesthesia assessment   Vitals: Vital Signs   6/38/7564 33:29 EDT Systolic Blood Pressure 518 mmHg  HI    Diastolic Blood Pressure 841 mmHg  HI    Heart Rate Monitored 75 bpm    Respiratory Rate 12 br/min    SpO2 100 %    SBP/DBP Cuff Details Left arm   6/60/6301 60:10 EDT Systolic Blood Pressure 932 mmHg  HI    Diastolic Blood Pressure 355 mmHg  >HHI    Heart Rate Monitored 81 bpm    Respiratory Rate 11 br/min  LOW    SpO2 99 %    SBP/DBP Cuff Details Left arm   7/32/2025 42:70 EDT Systolic Blood Pressure 623 mmHg  HI    Diastolic Blood Pressure 762 mmHg  >HHI    Heart Rate Monitored 80 bpm    Respiratory Rate 12 br/min    SpO2 99 %    SBP/DBP Cuff Details Left arm   10/16/5174 16:07 EDT Systolic Blood Pressure 371 mmHg  HI    Diastolic Blood Pressure 062 mmHg  HI    Heart Rate Monitored 82 bpm    Respiratory Rate 11 br/min  LOW    SpO2 99 %    SBP/DBP Cuff Details Left arm   6/94/8546 27:03 EDT Systolic Blood Pressure 500 mmHg  HI    Diastolic Blood Pressure 938 mmHg  >HHI    Temperature Temporal Artery 37.1 degC    Heart Rate Monitored 87 bpm    Respiratory Rate 14 br/min    SpO2 99 %    SBP/DBP Cuff Details Left arm   1/82/9937 1:69 EDT Systolic Blood Pressure 678 mmHg  HI    Diastolic Blood Pressure 97 mmHg  HI    Temperature Oral 36.5 degC    Heart Rate Monitored 71 bpm    Respiratory Rate 16 br/min    SpO2 99 %    SBP/DBP Cuff Details Right arm      , Measurements from flowsheet : Measurements   06/28/2019 8:29 EDT Body Mass Index est meas 31.03 kg/m2   06/28/2019 8:29 EDT Body Mass Index  Measured 31.03 kg/m2   06/28/2019 7:15 EDT Height/Length Estimated 167.64 cm    Weight Measured 87.2 kg   06/28/2019 7:10 EDT Height/Length Estimated 167.64 cm    Weight Estimated 89.5 kg    Body Mass Index Estimated 31.85 kg/m2      .     Respiratory function: Respiratory rate, airway, and oxygen saturation are at adequate levels.     Respiratory support: head of bed elevated 30 degrees, oxygen room air.     Cardiovascular function: Heart Rate stable, Blood Pressure stable, Postoperative hydration  status Adequate.     Cardiovascular support: none.     Mental status: appropriate for level of anesthesia.     Temperature: within normal limits.     Pain Control: Adequate.     Nausea/Vomiting: Absent.     Signature Line     Electronically Signed on 06/28/2019 01:28 PM EDT   ________________________________________________   Sharene Skeans JAMES-MD

## 2020-09-15 ENCOUNTER — Encounter: Admit: 2020-09-15 | Discharge: 2020-09-15 | Attending: Surgical

## 2020-09-15 DIAGNOSIS — M1712 Unilateral primary osteoarthritis, left knee: Secondary | ICD-10-CM

## 2020-09-15 MED ORDER — SODIUM HYALURONATE 20 MG/2ML IX SOSY
202 MG/2ML | Freq: Once | INTRA_ARTICULAR | Status: AC
Start: 2020-09-15 — End: 2020-09-15
  Administered 2020-09-15: 20:00:00 20 mg via INTRA_ARTICULAR

## 2020-09-15 NOTE — Progress Notes (Signed)
Patient Ronald Herrera presents today for viscosupplementation injection.  He feels that the injections are helping his pain somewhat.    Side:  left  Injection: Euflexxa  Injection Number: 3rd    After discussing with the patient the benefits and risks of viscosupplementation injection, with the right knee flexed to 90 degrees, the inferior lateral aspect of the knee was marked and prepped under sterile conditions. Ethyl Chloride was sprayed on the injection site to numb the skin. After the injection site was appropriately numb I then injected one ampule of viscosupplementation into the intra-articular space of the involved knee. The injection site was sterily dressed. The patient tolerated the procedure well .     Post-Procedure  There were no complications during the procedure. The patient has been instructed on post-procedure care.     Follow up: 6 months or prn

## 2020-12-25 ENCOUNTER — Telehealth

## 2020-12-25 NOTE — Telephone Encounter (Signed)
I will need to discuss with Dr. Randa Lynn what surgery is an option before contacting Mr. Severns.

## 2020-12-25 NOTE — Telephone Encounter (Signed)
Patient would like to get a nicotine test done so that he can get scheduled for surgery.

## 2020-12-29 NOTE — Telephone Encounter (Signed)
Nicotine test has been ordered. Patient should be tobacco product free for 30 days prior to testing to ensure passing results. Test can be completed at any [facility] lab facility. After testing is completed, Mr. Ronald Herrera will need to schedule a follow up appointment with Dr. Randa Lynn. I have called Mr. Ronald Herrera however his VM was full.

## 2021-01-01 ENCOUNTER — Encounter

## 2021-01-01 ENCOUNTER — Inpatient Hospital Stay: Admit: 2021-01-01 | Discharge: 2021-01-01

## 2021-01-01 DIAGNOSIS — F17208 Nicotine dependence, unspecified, with other nicotine-induced disorders: Secondary | ICD-10-CM

## 2021-01-01 NOTE — Telephone Encounter (Signed)
Patient called in regarding nicotine test. He was at [facility] and they did not have the order. After looking in chart, the order was there howver, Ronald Ronald Herrera has two different charts with the same information. I have placed the order in the duplicate chart and they received the order. Ronald Herrera will receive a call with further instructions once the results are in.

## 2021-01-07 LAB — NICOTINE AND METABOLITES
Cotinine: 7.4 ng/mL
Nicotine: <1 ng/mL

## 2021-01-07 NOTE — Telephone Encounter (Signed)
Wants nictoine test results

## 2021-01-12 NOTE — Telephone Encounter (Signed)
We have responded to open WebMessage from Mr. Ronald Herrera. Nicotine test did come back within range for proceeding with surgery however a follow up with Dr. Randa Lynn is required.

## 2021-01-12 NOTE — Telephone Encounter (Signed)
Mr. Curtis will need to follow up with Dr. Randa Lynn to discuss potential surgery. I have offered 12/9 at 11:50am.

## 2021-01-23 ENCOUNTER — Ambulatory Visit: Admit: 2021-01-23 | Discharge: 2021-01-23 | Payer: PRIVATE HEALTH INSURANCE | Attending: Orthopaedic Surgery

## 2021-01-23 ENCOUNTER — Ambulatory Visit: Admit: 2021-01-23 | Discharge: 2021-01-23 | Payer: PRIVATE HEALTH INSURANCE

## 2021-01-23 DIAGNOSIS — M19072 Primary osteoarthritis, left ankle and foot: Secondary | ICD-10-CM

## 2021-01-23 NOTE — Progress Notes (Signed)
ORTHOPAEDICS    Name: Ronald Herrera  Date of Birth: 11-24-1970  Gender: male  MRN: [id number]    CC: Follow-up (Left knee)       HPI:   Ronald Herrera is a 50 y.o. male who presents with Follow-up (Left knee)  .  The patient returns today.  He has quit smoking    He continues to have significant left ankle pain    He previously has had an oats procedure done for an osteochondral defect involving the lateral talar dome    He reports having near constant left ankle pain      ROS/Meds/PSH/PMH/FH/SH:   I personally reviewed the patients standard intake form.  Below are the pertinents    Tobacco:  reports that he has an unknown smoking status. He has never used smokeless tobacco.  Diabetes: No No results found for: LABA1C  No results found for: EAG   Allergies   Allergen Reactions    Bee Pollen     Bromfenac      Other reaction(s): Hematochezia    Grass Pollen(K-O-R-T-Swt Vern) Other (See Comments)    Nsaids      Other reaction(s): Hematochezia  Other reaction(s): Hematochezia    Oxycodone     Percocet [Oxycodone-Acetaminophen] Other (See Comments)     Irritable    Pollen Extract Other (See Comments)    Oxycodone-Acetaminophen Anxiety     Other reaction(s): Short Tempered  Other reaction(s): MAKES PATIENT SHORT TEMPERED  Event:          Past Medical History:   Diagnosis Date    Anxiety     Arthritis     Depression     Fibromyalgia     Gastric ulcer     Generalized headache     GERD (gastroesophageal reflux disease)     Left ankle instability     Unstable ankle, left      Past Surgical History:   Procedure Laterality Date    ANKLE SURGERY Left     Left lat lig repair, excision bone distal tibia, OCD bone grafting 06/28/2019    ANKLE SURGERY Left 06/28/2019    left ankle partial resection tibia, partial resection fibula, brostrom gould lateral ankle ligament reconstruction, left ankle mosaicplasty with aosteochondral plug transfer from ipsilateral knee-dr. blake ohlson    FOOT SURGERY Left     IP fusion 12/2017     TOE SURGERY  2019    Left foot IP joint [facility]desis [facility] [place]        Current Outpatient Medications:     amoxicillin-clavulanate (AUGMENTIN) 875-125 MG per tablet, TAKE ONE TABLET BY MOUTH TWICE A DAY, Disp: , Rfl:     aspirin 81 MG chewable tablet, 81 mg 1 tabs, Oral, BID, 1 tablet by mouth twice daily for dvt prophylaxis, begin in am 5/14, 90 tabs, 0, 0, 06/29/19 8:00:00 EDT, Route to Pharmacy Electronically, [facility], Tab-Chew, 1 tabs Oral BID, x45 days, Instr:1 tablet..., Disp: , Rfl:     chlorhexidine (HIBICLENS) 4 % external liquid, APPLY SMALL AMOUNT TO AFFECTED AREA EVERY DAY *EXTERNAL USE ONLY*  MIX WITH LIQUID DIAL SOAP AND SCRUB ARM PITS, GROIN, BUTTOKS, AND LEGS  WHEN BATHING *EXTERNAL USE ONLY*  MIX WITH LIQUID DIAL SOAP AND SCRUB ARM PITS, GROIN, BUTTOKS, AND LEGS  WHEN BATHING, Disp: , Rfl:     docusate sodium (COLACE) 100 MG capsule, 1 tabs, Oral, BID, 60 tabs, 1, 1, 06/28/19 11:56:00 EDT, Route to Pharmacy Electronically, [facility], 1 tabs Oral BID, Disp: , Rfl:     doxycycline monohydrate (MONODOX) 100 MG capsule, 100 mg, Disp: , Rfl:     fluocinonide (LIDEX) 0.05 % cream, APPLY SMALL AMOUNT TO AFFECTED AREA EVERY DAY *EXTERNAL USE ONLY*, Disp: , Rfl:     gabapentin (NEURONTIN) 300 MG capsule, 600 mg., Disp: , Rfl:     lisinopril (PRINIVIL;ZESTRIL) 40 MG tablet, 40 mg 1 tabs, Oral, qAM, 0, 06/12/19 8:28:00 EDT, Tab, Disp: , Rfl:     ondansetron (ZOFRAN) 4 MG tablet, 4 mg 1 tabs, Oral, q6hr, PRN, 20 tabs, 1, 1, 06/28/19 11:56:00 EDT, Route to Pharmacy Electronically, [facility], Tab, 1 tabs Oral q6hr, x5 days, PRN:as needed for nausea/vomiting, as needed for nausea/vomiting, Disp: , Rfl:     pantoprazole (PROTONIX) 40 MG tablet, 40 mg, Disp: , Rfl:     sildenafil (VIAGRA) 100 MG tablet, TAKE ONE TABLET BY MOUTH ONCE WEEKLY FOR ERECTILE DYSFUNCTION; TAKE 1 HOUR BEFORE SEX *MAX 1 DOSE/24 HOURS - LIMIT 18 DOSES/90 DAYS*, Disp: , Rfl:     tadalafil  (CIALIS) 20 MG tablet, 20 mg, Disp: , Rfl:     sucralfate (CARAFATE) 1 GM tablet, , Disp: , Rfl:     acetaminophen (TYLENOL) 500 MG tablet, TAKE TWO TABLETS BY MOUTH THREE TIMES A DAY AS NEEDED FOR PAIN OR FEVER *MAX 4000MG  ACETAMINOPHEN PER DAY, Disp: , Rfl:     acetaminophen (TYLENOL) 500 MG tablet, , Disp: , Rfl:     acyclovir (ZOVIRAX) 400 MG tablet, TAKE ONE TABLET BY MOUTH TWICE A DAY, Disp: , Rfl:     acyclovir (ZOVIRAX) 400 MG tablet, , Disp: , Rfl:     buPROPion (WELLBUTRIN XL) 150 MG extended release tablet, TAKE ONE TABLET BY MOUTH EVERY MORNING *DO NOT CRUSH OR CHEW*, Disp: , Rfl:     buPROPion (WELLBUTRIN XL) 150 MG extended release tablet, , Disp: , Rfl:     buPROPion (WELLBUTRIN XL) 300 MG extended release tablet, TAKE ONE TABLET BY MOUTH EVERY MORNING *DO NOT CRUSH OR CHEW*, Disp: , Rfl:     buPROPion (WELLBUTRIN XL) 300 MG extended release tablet, , Disp: , Rfl:     LUBRICATING PLUS EYE DROPS 0.5 % SOLN ophthalmic solution, , Disp: , Rfl:     cetirizine (ZYRTEC) 10 MG tablet, , Disp: , Rfl:     clotrimazole (LOTRIMIN) 1 % external solution, APPLY 3 DROPS TO AFFECTED AREA EVERY DAY TO TOENAIL---EXTERNAL USE ONLY, Disp: , Rfl:     clotrimazole (LOTRIMIN) 1 % external solution, , Disp: , Rfl:     DULoxetine (CYMBALTA) 60 MG extended release capsule, TAKE ONE CAPSULE BY MOUTH TWICE A DAY, Disp: , Rfl:     cyproheptadine (PERIACTIN) 4 MG tablet, 4 mg, Disp: , Rfl:     ergocalciferol (ERGOCALCIFEROL) 1.25 MG (50000 UT) capsule, TAKE ONE CAPSULE BY MOUTH ONCE WEEKLY, Disp: , Rfl:     ergocalciferol (ERGOCALCIFEROL) 1.25 MG (50000 UT) capsule, , Disp: , Rfl:     hydroCHLOROthiazide (HYDRODIURIL) 25 MG tablet, TAKE ONE TABLET BY MOUTH EVERY DAY, Disp: , Rfl:     hydroCHLOROthiazide (HYDRODIURIL) 25 MG tablet, , Disp: , Rfl:     losartan (COZAAR) 100 MG tablet, TAKE ONE TABLET BY MOUTH EVERY DAY, Disp: , Rfl:     losartan (COZAAR) 100 MG tablet, TAKE ONE-HALF TABLET BY MOUTH EVERY DAY, Disp: , Rfl:      losartan (COZAAR) 100 MG tablet, , Disp: , Rfl:  olopatadine (PATANOL) 0.1 % ophthalmic solution, , Disp: , Rfl:     traMADol (ULTRAM) 50 MG tablet, TAKE 1 TO 2 TABLETS BY MOUTH THREE TIMES A DAY AS NEEDED FOR PAIN, Disp: , Rfl:     traZODone (DESYREL) 100 MG tablet, TAKE 1 AND 1/2 TABLETS TO 2TABLETS BY MOUTH AT BEDTIME AS NEEDED FOR SLEEP, Disp: , Rfl:     traZODone (DESYREL) 100 MG tablet, , Disp: , Rfl:    Review of Systems     Physical Examination:  Ht  (1.676 m)    Wt 197 lb (89.4 kg)    BMI 31.80 kg/m??    Mild antalgic gait  Well appearing male in no acute distress, alert and oriented x3, conversant with a normal affect. Responds to questions appropriately.  Appears stated age.   Head is normocephalic and atraumatic.   Pupils are equal and round bilaterally with anicteric sclera. EOMI.   Mucous membranes moist without ulceration. Oropharynx clear without lesions.  Trachea is midline without thyromegaly. Neck is supple without lymphadenopathy.   Respirations are unlabored. No distal cyanosis or clubbing.  Pulses 2+ throughout extremities with RRR.  Skin is warm, dry, and well perfused. No rashes or lesions. Brisk capillary refill.     Left    Extremity examination    Ankle tenderness to palpation anterior joint line of ankle    Skin over the foot and ankle is intact.    Neutral hindfoot alignment and moderate arch height.    Negative calcaneal squeeze, negative Tinel's, no pain over the posterior tibial tendon Achilles tendon or peroneal tendons.    Negative anterior drawer.    Negative Silverskiold sign    Ankle range of motion 10 degrees dorsiflexion and 30 degrees plantar flexion. Inversion 25 eversion 15 degrees.    5/ 5 plantar flexion, dorsiflexion, inversion, eversion.    Sensation intact to light touch.        Imaging:   XR ANKLE LEFT (MIN 3 VIEWS) (CLINIC PERFORMED)  3 views left ankle show mild to moderate degenerative change consistent   with osteoarthritis.  History of oats procedure        XR ANKLE LEFT (MIN 3 VIEWS) (CLINIC PERFORMED)    Result Date: 01/23/2021  3 views left ankle show mild to moderate degenerative change consistent with osteoarthritis.  History of oats procedure       Assessment:     ICD-10-CM    1. Arthritis of left ankle  M19.072 XR ANKLE LEFT (MIN 3 VIEWS) (CLINIC PERFORMED)     External Referral To DME           Plan:     Treatment at this time:  1. Arthritis of left ankle  Assessment & Plan:    Ankle Arthritis    Nonsurgical Treatment    I explained that Initial treatment of arthritis of the foot and ankle is usually nonsurgical.    Lifestyle modifications such as activity modification, switching from high-impact activities (like jogging or tennis) to lower impact activities (like swimming or cycling) to lessen the stress on the foot and ankle.    Losing weight     Physical therapy. Specific exercises can help increase range of motion and flexibility, as well as help strengthen the muscles in your foot and ankle.    A custom-molded leather brace for ankle and hindfoot arthritis    Assistive devices. Using a cane or wearing a brace-such as an ankle-foot orthosis (AFO)-may help improve mobility. In  addition, wearing shoe inserts (orthotics) or custom-made shoes with stiff soles and rocker bottoms can help minimize pressure on the foot and decrease pain. In addition, if deformity is present, a shoe insert may tilt the foot of ankle back straight, creating less pain in the joint.    Medications. Nonsteroidal anti-inflammatory drugs (NSAIDs), such as ibuprofen and naproxen, can help reduce swelling and relieve pain    Cortisone is a very effective anti-inflammatory agent that can be injected into an arthritic joint. Although an injection of cortisone can provide pain relief and reduce inflammation, the effects are temporary.    Surgical Treatment    Ankle arthroscopy with debridement and excision of impinging bone.    Ankle fusion    Ankle replacement    He unfortunately has  continued to have significant pain status post oats procedure to his left ankle    He is failed conservative treatment including use of an Webster brace, activity modification, nostril anti-inflammatories, injections    At this point I do not have anything else to offer surgically short of an ankle fusion    I have told him about the importance of delaying this for as long as possible.  However he insists that his pain is severe and that he needs to the ankle fusion.    We will plan on requesting approval for the surgery from the [place]    We will plan on ankle fusion with proximal tibial bone graft harvest and aspiration    Today we reviewed risks and benefits of surgery.  We discussed the risk of surgical complications including pain, nerve injury, vascular injury, infection, need for additional surgery, postoperative blood clots, risks associated with anesthesia including but not limited to death.  We addressed all questions at this time.  Orders:  -     XR ANKLE LEFT (MIN 3 VIEWS) (CLINIC PERFORMED); Future  -     External Referral To DME

## 2021-01-23 NOTE — Assessment & Plan Note (Signed)
Ankle Arthritis    Nonsurgical Treatment    I explained that Initial treatment of arthritis of the foot and ankle is usually nonsurgical.    Lifestyle modifications such as activity modification, switching from high-impact activities (like jogging or tennis) to lower impact activities (like swimming or cycling) to lessen the stress on the foot and ankle.    Losing weight     Physical therapy. Specific exercises can help increase range of motion and flexibility, as well as help strengthen the muscles in your foot and ankle.    A custom-molded leather brace for ankle and hindfoot arthritis    Assistive devices. Using a cane or wearing a brace-such as an ankle-foot orthosis (AFO)-may help improve mobility. In addition, wearing shoe inserts (orthotics) or custom-made shoes with stiff soles and rocker bottoms can help minimize pressure on the foot and decrease pain. In addition, if deformity is present, a shoe insert may tilt the foot of ankle back straight, creating less pain in the joint.    Medications. Nonsteroidal anti-inflammatory drugs (NSAIDs), such as ibuprofen and naproxen, can help reduce swelling and relieve pain    Cortisone is a very effective anti-inflammatory agent that can be injected into an arthritic joint. Although an injection of cortisone can provide pain relief and reduce inflammation, the effects are temporary.    Surgical Treatment    Ankle arthroscopy with debridement and excision of impinging bone.    Ankle fusion    Ankle replacement    He unfortunately has continued to have significant pain status post oats procedure to his left ankle    He is failed conservative treatment including use of an Biron brace, activity modification, nostril anti-inflammatories, injections    At this point I do not have anything else to offer surgically short of an ankle fusion    I have told him about the importance of delaying this for as long as possible.  However he insists that his pain is severe and that  he needs to the ankle fusion.    We will plan on requesting approval for the surgery from the [place]    We will plan on ankle fusion with proximal tibial bone graft harvest and aspiration    Today we reviewed risks and benefits of surgery.  We discussed the risk of surgical complications including pain, nerve injury, vascular injury, infection, need for additional surgery, postoperative blood clots, risks associated with anesthesia including but not limited to death.  We addressed all questions at this time.

## 2021-01-29 NOTE — Telephone Encounter (Signed)
[facility] EXTENSION [facility]

## 2021-03-09 NOTE — Telephone Encounter (Signed)
[facility] TO [facility] TO [facility]

## 2021-03-16 NOTE — Telephone Encounter (Signed)
PATIENT WOULD LIKE TO SCHEDULE HIS LT ANKLE SURGERY [phone]

## 2021-03-16 NOTE — Telephone Encounter (Signed)
Surgery scheduled for 3/16 [facility].

## 2021-03-19 ENCOUNTER — Encounter

## 2021-04-20 NOTE — Progress Notes (Signed)
Pre Procedure Patient Instructions    Procedure Location hospital:[facility]: [address], [place] in the garage located at [address]. Your parking ticket will be validated. Take the sidewalk to the front entrance of the hospital. Enter the Ambulatory Surgery doors to the left of front entrance.    Procedure Date 04-30-21  Arrival Time Per Physicians Instructions     Medications:  Medication to be taken the morning of surgery with a few sips of water only: ACYCLOVIR, CETIRIZINE, DULOXETINE, VIT D3, GABAPENTIN, PANTOPRAZOLE, AND IF NEEDED MAY DO PATANOL EYE DROPS.  Hold or reduce the dosage of the following medication, as discussed: THE MORNING OF SURGERY HOLD LOSARTAN AND HYDROCHLOROTHIAZIDE.  THE NIGHT BEFORE HOLD LOTRIMIN CREAM.  HOLD TADALAFIL THREE DAYS PRIOR TO SURGERY.  Do not take over the counter pain medications except plain Tylenol or acetaminophen unless your doctor told you to do so.    Procedure Preparation    Diet Restrictions:No food or drink including gum or mints -after midnight    Skin Preparation:   Wash with Hibiclens or an antibacterial soap (e.g. Dial soap) the night before and morning of procedure.  Using a clean washcloth, wash from neck to toes for 3 minutes.Gently clean the area where your surgery will be done thoroughly  Do not use Hibiclens on or near your face, eyes, ears, or head  Do not put on any deodorants, lotions, powders, or oils afterwards. Be sure to put on clean clothing.    Other Preparation:  Call your surgeon right away if you get any wounds, cuts, scrapes, scabs, rashes, bug bites at or near your operative site.  Complete tests for surgery by 04-27-21 at [facility]: [address] [employer]; M-F 7:30-5:30p; [place] do not need to fast. Bring your picture ID and insurance card. Inform the receptionist you are there for testing before your surgery. Tell them the name of your doctor. If you have any issue, call  [phone]      Day of Procedure Patient Instruction:  Do not smoke, vape, chew tobacco, drink alcohol or use recreational drugs on the day of your procedure  Remove all jewelry, piercings and metal accessories  Do not wear contacts, tampons, make-up, lotions, creams, powders, fragrances or deodorant  Do not bring valuables or money  Bring a picture ID and insurance card and any of the following that are applicable to you:    ?? Inhalers    ?? CPAP or BiPAP machine    ?? Remote for spinal cord stimulator or other implanted device    ?? Insulin pump supplies    ?? Walker or other orthopedic device necessary for postop    ?? Storage case for eyeglasses, hearing aids, dentures, etc    ?? A loose button-up shirt if instructed    ?? Copy of your Living Will and/or Medical Durable Power of Attorney    ?? List of current medications including name and dosage    .  If you are going home the same day as your procedure, a support person should accompany you to the facility and must transport you home.  If you plan to take public transportation of any sort, your support person must accompany you home.  You will need someone to stay with you for 24 hours after your procedure with sedation of any kind.         COVID Testing Instructions:  No COVID testing required as discussed.  Comments:     The information and visitor policy was reviewed with you during your Pre-Admission Testing interview and you verbalized understanding. If you have any additional questions please contact [phone]    For financial questions regarding your procedure at a [facility] facility, please contact [phone], option 1    For financial questions regarding anesthesia at a [facility] facility, please  contact [phone]

## 2021-04-21 ENCOUNTER — Encounter

## 2021-04-21 NOTE — Telephone Encounter (Signed)
Albin Fischer, ATC 04/21/2021 3:31 PM EST      ----- Message -----  From: Ronald Herrera  Sent: 04/21/2021 10:37 AM EST  To: Albin Fischer, ATC  Subject: FW: Medication after surgery       ----- Message -----  From: Ronald Herrera  Sent: 04/21/2021 10:08 AM EST  To: [facility] Clinical Staff  Subject: Medication after surgery     Hi Jess I spoke with my pharmacy at the [facility] if you can fax them my medication for after my surgery they will fill it and get it to me by my surgery date the fax is [phone] or if you are able to do it electronically she said just to search for [place]. Please let me know if you can do either of these?

## 2021-04-28 MED ORDER — ONDANSETRON HCL 4 MG PO TABS
4 MG | ORAL_TABLET | Freq: Three times a day (TID) | ORAL | 1 refills | Status: DC | PRN
Start: 2021-04-28 — End: 2021-06-22

## 2021-04-28 MED ORDER — ASPIRIN EC 325 MG PO TBEC
325 MG | ORAL_TABLET | Freq: Every day | ORAL | 0 refills | Status: DC
Start: 2021-04-28 — End: 2021-06-22

## 2021-04-28 MED ORDER — HYDROCODONE-ACETAMINOPHEN 7.5-325 MG PO TABS
7.5-325 MG | ORAL_TABLET | Freq: Four times a day (QID) | ORAL | 0 refills | Status: AC | PRN
Start: 2021-04-28 — End: 2021-05-03

## 2021-04-28 NOTE — Telephone Encounter (Signed)
Rx sent.

## 2021-04-29 ENCOUNTER — Inpatient Hospital Stay: Admit: 2021-04-29 | Discharge: 2021-04-29 | Payer: PRIVATE HEALTH INSURANCE

## 2021-04-29 DIAGNOSIS — Z5181 Encounter for therapeutic drug level monitoring: Secondary | ICD-10-CM

## 2021-04-29 LAB — POTASSIUM: Potassium: 4 mmol/L (ref 3.5–5.3)

## 2021-04-30 ENCOUNTER — Inpatient Hospital Stay: Payer: PRIVATE HEALTH INSURANCE

## 2021-04-30 MED ORDER — NORMAL SALINE FLUSH 0.9 % IV SOLN
0.9 % | INTRAVENOUS | Status: DC | PRN
Start: 2021-04-30 — End: 2021-04-30

## 2021-04-30 MED ORDER — FENTANYL CITRATE (PF) 100 MCG/2ML IJ SOLN
100 MCG/2ML | INTRAMUSCULAR | Status: AC
Start: 2021-04-30 — End: ?

## 2021-04-30 MED ORDER — PROPOFOL 200 MG/20ML IV EMUL
200 MG/20ML | INTRAVENOUS | Status: AC
Start: 2021-04-30 — End: ?

## 2021-04-30 MED ORDER — MIDAZOLAM HCL 2 MG/2ML IJ SOLN
2 MG/ML | INTRAMUSCULAR | Status: DC | PRN
Start: 2021-04-30 — End: 2021-04-30
  Administered 2021-04-30: 15:00:00 2 via INTRAVENOUS

## 2021-04-30 MED ORDER — PROPOFOL 200 MG/20ML IV EMUL
200 MG/20ML | INTRAVENOUS | Status: DC | PRN
Start: 2021-04-30 — End: 2021-04-30
  Administered 2021-04-30: 16:00:00 200 via INTRAVENOUS

## 2021-04-30 MED ORDER — EPINEPHRINE (ANAPHYLAXIS) 30 MG/30ML IJ SOLN
30 MG/ML | INTRAMUSCULAR | Status: AC
Start: 2021-04-30 — End: ?

## 2021-04-30 MED ORDER — CEFAZOLIN SODIUM-DEXTROSE 2-3 GM-%(50ML) IV SOLR
20003 mg in Dextrose 3% | INTRAVENOUS | Status: AC
Start: 2021-04-30 — End: 2021-04-30
  Administered 2021-04-30: 16:00:00 2000 mg via INTRAVENOUS

## 2021-04-30 MED ORDER — FENTANYL CITRATE (PF) 100 MCG/2ML IJ SOLN
100 MCG/2ML | INTRAMUSCULAR | Status: DC | PRN
Start: 2021-04-30 — End: 2021-04-30
  Administered 2021-04-30: 16:00:00 50 via INTRAVENOUS
  Administered 2021-04-30: 15:00:00 100 via INTRAVENOUS

## 2021-04-30 MED ORDER — LABETALOL HCL 20 MG/4ML IV SOSY
20 MG/4ML | Freq: Once | INTRAVENOUS | Status: AC
Start: 2021-04-30 — End: 2021-04-30
  Administered 2021-04-30: 18:00:00 5 mg via INTRAVENOUS

## 2021-04-30 MED ORDER — FENTANYL CITRATE (PF) 100 MCG/2ML IJ SOLN
100 MCG/2ML | INTRAMUSCULAR | Status: DC | PRN
Start: 2021-04-30 — End: 2021-04-30
  Administered 2021-04-30: 18:00:00 50 ug via INTRAVENOUS

## 2021-04-30 MED ORDER — ROPIVACAINE HCL 2 MG/ML IJ SOLN
INTRAMUSCULAR | Status: AC
Start: 2021-04-30 — End: 2021-04-30
  Administered 2021-04-30: 15:00:00 20 via PERINEURAL

## 2021-04-30 MED ORDER — ONDANSETRON HCL 4 MG/2ML IJ SOLN
4 MG/2ML | Freq: Once | INTRAMUSCULAR | Status: DC | PRN
Start: 2021-04-30 — End: 2021-04-30

## 2021-04-30 MED ORDER — ONDANSETRON HCL 4 MG/2ML IJ SOLN
4 MG/2ML | INTRAMUSCULAR | Status: DC | PRN
Start: 2021-04-30 — End: 2021-04-30
  Administered 2021-04-30: 16:00:00 4 via INTRAVENOUS

## 2021-04-30 MED ORDER — DEXAMETHASONE SODIUM PHOSPHATE 4 MG/ML IJ SOLN
4 MG/ML | Freq: Once | INTRAMUSCULAR | Status: DC | PRN
Start: 2021-04-30 — End: 2021-04-30

## 2021-04-30 MED ORDER — DEXAMETHASONE SODIUM PHOSPHATE 4 MG/ML IJ SOLN
4 MG/ML | INTRAMUSCULAR | Status: AC
Start: 2021-04-30 — End: ?

## 2021-04-30 MED ORDER — LIDOCAINE HCL (PF) 2 % IJ SOLN
2 % | INTRAMUSCULAR | Status: AC
Start: 2021-04-30 — End: ?

## 2021-04-30 MED ORDER — SODIUM CHLORIDE 0.9 % IV SOLN
0.9 % | INTRAVENOUS | Status: DC | PRN
Start: 2021-04-30 — End: 2021-04-30

## 2021-04-30 MED ORDER — NORMAL SALINE FLUSH 0.9 % IV SOLN
0.9 % | Freq: Two times a day (BID) | INTRAVENOUS | Status: DC
Start: 2021-04-30 — End: 2021-04-30

## 2021-04-30 MED ORDER — LIDOCAINE HCL (PF) 2 % IJ SOLN
2 % | INTRAMUSCULAR | Status: DC | PRN
Start: 2021-04-30 — End: 2021-04-30
  Administered 2021-04-30: 16:00:00 50 via INTRAVENOUS

## 2021-04-30 MED ORDER — LACTATED RINGERS IV SOLN
INTRAVENOUS | Status: DC
Start: 2021-04-30 — End: 2021-04-30
  Administered 2021-04-30: 16:00:00 via INTRAVENOUS

## 2021-04-30 MED ORDER — FENTANYL CITRATE (PF) 100 MCG/2ML IJ SOLN
100 MCG/2ML | INTRAMUSCULAR | Status: AC
Start: 2021-04-30 — End: 2021-04-30
  Administered 2021-04-30: 18:00:00 25 via INTRAVENOUS

## 2021-04-30 MED ORDER — DEXAMETHASONE SODIUM PHOSPHATE 4 MG/ML IJ SOLN
4 MG/ML | INTRAMUSCULAR | Status: DC | PRN
Start: 2021-04-30 — End: 2021-04-30
  Administered 2021-04-30: 16:00:00 8 via INTRAVENOUS

## 2021-04-30 MED ORDER — HEPARIN SODIUM (PORCINE) 1000 UNIT/ML IJ SOLN
1000 UNIT/ML | INTRAMUSCULAR | Status: AC
Start: 2021-04-30 — End: ?

## 2021-04-30 MED ORDER — FENTANYL CITRATE (PF) 100 MCG/2ML IJ SOLN
100 MCG/2ML | INTRAMUSCULAR | Status: DC | PRN
Start: 2021-04-30 — End: 2021-04-30

## 2021-04-30 MED ORDER — ONDANSETRON HCL 4 MG/2ML IJ SOLN
4 MG/2ML | INTRAMUSCULAR | Status: AC
Start: 2021-04-30 — End: ?

## 2021-04-30 MED ORDER — MIDAZOLAM HCL 2 MG/2ML IJ SOLN
2 MG/ML | INTRAMUSCULAR | Status: AC
Start: 2021-04-30 — End: 2021-04-30

## 2021-04-30 MED ORDER — HYDROCODONE-ACETAMINOPHEN 7.5-325 MG PO TABS
Freq: Once | ORAL | Status: AC
Start: 2021-04-30 — End: 2021-04-30
  Administered 2021-04-30: 19:00:00 1 via ORAL

## 2021-04-30 MED FILL — DEXAMETHASONE SODIUM PHOSPHATE 4 MG/ML IJ SOLN: 4 MG/ML | INTRAMUSCULAR | Qty: 2

## 2021-04-30 MED FILL — HEPARIN SODIUM (PORCINE) 1000 UNIT/ML IJ SOLN: 1000 UNIT/ML | INTRAMUSCULAR | Qty: 10

## 2021-04-30 MED FILL — DIPRIVAN 200 MG/20ML IV EMUL: 200 MG/20ML | INTRAVENOUS | Qty: 20

## 2021-04-30 MED FILL — ADRENALIN 30 MG/30ML IJ SOLN: 30 MG/ML | INTRAMUSCULAR | Qty: 30

## 2021-04-30 MED FILL — ONDANSETRON HCL 4 MG/2ML IJ SOLN: 4 MG/2ML | INTRAMUSCULAR | Qty: 2

## 2021-04-30 MED FILL — FENTANYL CITRATE (PF) 100 MCG/2ML IJ SOLN: 100 MCG/2ML | INTRAMUSCULAR | Qty: 2

## 2021-04-30 MED FILL — CEFAZOLIN SODIUM-DEXTROSE 2-3 GM-%(50ML) IV SOLR: 2000 mg in Dextrose 3% | INTRAVENOUS | Qty: 50

## 2021-04-30 MED FILL — MIDAZOLAM HCL 2 MG/2ML IJ SOLN: 2 MG/ML | INTRAMUSCULAR | Qty: 2

## 2021-04-30 MED FILL — LABETALOL HCL 20 MG/4ML IV SOSY: 20 MG/4ML | INTRAVENOUS | Qty: 4

## 2021-04-30 MED FILL — LIDOCAINE HCL (PF) 2 % IJ SOLN: 2 % | INTRAMUSCULAR | Qty: 5

## 2021-04-30 MED FILL — HYDROCODONE-ACETAMINOPHEN 7.5-325 MG PO TABS: ORAL | Qty: 1

## 2021-04-30 NOTE — Anesthesia Procedure Notes (Signed)
Peripheral Block    Patient location during procedure: pre-op  Reason for block: procedure for pain, post-op pain management and at surgeon's request  Start time: 04/30/2021 11:21 AM  Staffing  Performed: anesthesiologist   Preanesthetic Checklist  Completed: patient identified, IV checked, site marked, risks and benefits discussed, surgical/procedural consents, equipment checked, pre-op evaluation, timeout performed, anesthesia consent given, oxygen available and monitors applied/VS acknowledged  Peripheral Block   Prep: Betadine  Provider prep: mask  Patient monitoring: continuous pulse ox, oxygen and responsive to questions  Block type: Sciatic  Popliteal  Laterality: left  Injection technique: single-shot  Guidance: ultrasound guided    Needle   Needle type: short-bevel   Needle gauge: 20 G  Needle localization: anatomical landmarks and ultrasound guidance  Needle length: 8 cm  Assessment   Injection assessment: negative aspiration for heme, no paresthesia on injection, local visualized surrounding nerve on ultrasound and no intravascular symptoms  Paresthesia pain: none  Slow fractionated injection: yes  Hemodynamics: stable  Real-time [place] image taken/store: yes  Outcomes: uncomplicated    Additional Notes  The surgeon has consulted the department of regional anesthesia to place a peripheral nerve block for post-operative pain management for this patient. If a PNB catheter is placed, infusion is anticipated for up to 72 hours post-operatively unless otherwise discussed.     Prior to the above procedure, the alternatives, risks, side effects, and potential complications (including anesthetic toxicity, seizures, death, temporary and/or permanent nerve injury, and loss of extremity function) of the planned single injection and/or continuous peripheral nerve block. All were thoroughly discussed, and the patient and/or guardian acknowledge and accept all risks of the planned procedure.    Block completed under direct  U/S guidance. Frequent negative aspiration during injection. No pain on injection, low injection pressure, no paresthesia, no evidence of hematoma, and no other complications unless otherwise noted.    PERFORMED BLOCK:  SCIATIC PERIPHERAL NERVE BLOCK      Medications Administered  ropivacaine (NAROPIN) injection 0.2% - Perineural   20 mL - 04/30/2021 11:21:00 AM

## 2021-04-30 NOTE — H&P (Addendum)
Name: Ronald NegusMcArthur Mukai  Date of Birth: 03-12-1970  Gender: male  MRN: [id number]     CC: Follow-up (Left knee)        HPI:   Ronald Herrera is a 51 y.o. male who presents with Follow-up (Left knee)  .  The patient returns today.  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  Below are the pertinents     Tobacco:  reports that he has an unknown smoking status. He has never used smokeless tobacco.  Diabetes: No No results found for: LABA1C  No results found for: EAG         Allergies   Allergen Reactions    Bee Pollen      Bromfenac         Other reaction(s): Hematochezia    Grass Pollen(K-O-R-T-Swt Vern) Other (See Comments)    Nsaids         Other reaction(s): Hematochezia  Other reaction(s): Hematochezia    Oxycodone      Percocet [Oxycodone-Acetaminophen] Other (See Comments)       Irritable    Pollen Extract Other (See Comments)    Oxycodone-Acetaminophen Anxiety       Other reaction(s): Short Tempered  Other reaction(s): MAKES PATIENT SHORT TEMPERED  Event:           Past Medical History        Past Medical History:   Diagnosis Date    Anxiety      Arthritis      Depression      Fibromyalgia      Gastric ulcer      Generalized headache      GERD (gastroesophageal reflux disease)      Left ankle instability      Unstable ankle, left            Past Surgical History         Past Surgical History:   Procedure Laterality Date    ANKLE SURGERY Left       Left lat lig repair, excision bone distal tibia, OCD bone grafting 06/28/2019    ANKLE SURGERY Left 06/28/2019     left ankle partial resection tibia, partial resection fibula, brostrom gould lateral ankle ligament reconstruction, left ankle mosaicplasty with aosteochondral plug transfer from ipsilateral knee-dr. blake ohlson    FOOT SURGERY Left       IP fusion 12/2017    TOE SURGERY   2019     Left foot [facility] [place]           Current Medication      Current Outpatient Medications:     amoxicillin-clavulanate (AUGMENTIN) 875-125 MG per tablet, TAKE ONE TABLET BY MOUTH TWICE  A DAY, Disp: , Rfl:     aspirin 81 MG chewable tablet, 81 mg 1 tabs, Oral, BID, 1 tablet by mouth twice daily for dvt prophylaxis, begin in am 5/14, 90 tabs, 0, 0, 06/29/19 8:00:00 EDT, Route to Pharmacy Electronically, [facility], Tab-Chew, 1 tabs Oral BID, x45 days, Instr:1 tablet..., Disp: , Rfl:     chlorhexidine (HIBICLENS) 4 % external liquid, APPLY SMALL AMOUNT TO AFFECTED AREA EVERY DAY *EXTERNAL USE ONLY*  MIX WITH LIQUID DIAL SOAP AND SCRUB ARM PITS, GROIN, BUTTOKS, AND LEGS  WHEN BATHING *EXTERNAL USE ONLY*  MIX WITH LIQUID DIAL SOAP AND SCRUB ARM PITS, GROIN, BUTTOKS, AND LEGS  WHEN BATHING, Disp: , Rfl:     docusate sodium (COLACE) 100 MG capsule, 1 tabs, Oral, BID, 60 tabs, 1, 1, 06/28/19 11:56:00 EDT, Route to Pharmacy Electronically, [facility], 1 tabs Oral BID, Disp: , Rfl:     doxycycline monohydrate (MONODOX) 100 MG capsule, 100 mg, Disp: , Rfl:     fluocinonide (LIDEX) 0.05 % cream, APPLY SMALL AMOUNT TO AFFECTED AREA EVERY DAY *EXTERNAL USE ONLY*, Disp: , Rfl:     gabapentin (NEURONTIN) 300 MG capsule, 600 mg., Disp: , Rfl:     lisinopril (PRINIVIL;ZESTRIL) 40 MG tablet, 40 mg 1 tabs, Oral, qAM, 0, 06/12/19 8:28:00 EDT, Tab, Disp: , Rfl:     ondansetron  (ZOFRAN) 4 MG tablet, 4 mg 1 tabs, Oral, q6hr, PRN, 20 tabs, 1, 1, 06/28/19 11:56:00 EDT, Route to Pharmacy Electronically, [facility], Tab, 1 tabs Oral q6hr, x5 days, PRN:as needed for nausea/vomiting, as needed for nausea/vomiting, Disp: , Rfl:     pantoprazole (PROTONIX) 40 MG tablet, 40 mg, Disp: , Rfl:     sildenafil (VIAGRA) 100 MG tablet, TAKE ONE TABLET BY MOUTH ONCE WEEKLY FOR ERECTILE DYSFUNCTION; TAKE 1 HOUR BEFORE SEX *MAX 1 DOSE/24 HOURS - LIMIT 18 DOSES/90 DAYS*, Disp: , Rfl:     tadalafil (CIALIS) 20 MG tablet, 20 mg, Disp: , Rfl:     sucralfate (CARAFATE) 1 GM tablet, , Disp: , Rfl:     acetaminophen (TYLENOL) 500 MG tablet, TAKE TWO TABLETS BY MOUTH THREE TIMES A DAY AS NEEDED FOR PAIN OR FEVER *MAX 4000MG  ACETAMINOPHEN PER DAY, Disp: , Rfl:     acetaminophen (TYLENOL) 500 MG tablet, , Disp: , Rfl:     acyclovir (ZOVIRAX) 400 MG tablet, TAKE ONE TABLET BY MOUTH TWICE A DAY, Disp: , Rfl:     acyclovir (ZOVIRAX) 400 MG tablet, , Disp: , Rfl:     buPROPion (WELLBUTRIN XL) 150 MG extended release tablet, TAKE ONE TABLET BY MOUTH EVERY MORNING *DO NOT CRUSH OR CHEW*, Disp: , Rfl:     buPROPion (WELLBUTRIN XL) 150 MG extended release tablet, , Disp: , Rfl:     buPROPion (WELLBUTRIN XL) 300 MG extended release tablet, TAKE ONE TABLET BY MOUTH EVERY MORNING *DO NOT CRUSH OR CHEW*, Disp: , Rfl:     buPROPion (WELLBUTRIN XL) 300 MG extended release tablet, , Disp: , Rfl:     LUBRICATING PLUS EYE DROPS 0.5 % SOLN ophthalmic solution, , Disp: , Rfl:     cetirizine (ZYRTEC) 10 MG tablet, , Disp: , Rfl:     clotrimazole (LOTRIMIN) 1 % external solution, APPLY 3 DROPS TO AFFECTED AREA EVERY DAY TO TOENAIL---EXTERNAL USE ONLY, Disp: , Rfl:     clotrimazole (LOTRIMIN) 1 % external solution, , Disp: , Rfl:     DULoxetine (CYMBALTA) 60 MG extended release capsule,  TAKE ONE CAPSULE BY MOUTH TWICE A DAY, Disp: , Rfl:     cyproheptadine (PERIACTIN) 4 MG tablet, 4 mg, Disp: , Rfl:     ergocalciferol  (ERGOCALCIFEROL) 1.25 MG (50000 UT) capsule, TAKE ONE CAPSULE BY MOUTH ONCE WEEKLY, Disp: , Rfl:     ergocalciferol (ERGOCALCIFEROL) 1.25 MG (50000 UT) capsule, , Disp: , Rfl:     hydroCHLOROthiazide (HYDRODIURIL) 25 MG tablet, TAKE ONE TABLET BY MOUTH EVERY DAY, Disp: , Rfl:     hydroCHLOROthiazide (HYDRODIURIL) 25 MG tablet, , Disp: , Rfl:     losartan (COZAAR) 100 MG tablet, TAKE ONE TABLET BY MOUTH EVERY DAY, Disp: , Rfl:     losartan (COZAAR) 100 MG tablet, TAKE ONE-HALF TABLET BY MOUTH EVERY DAY, Disp: , Rfl:     losartan (COZAAR) 100 MG tablet, , Disp: , Rfl:     olopatadine (PATANOL) 0.1 % ophthalmic solution, , Disp: , Rfl:     traMADol (ULTRAM) 50 MG tablet, TAKE 1 TO 2 TABLETS BY MOUTH THREE TIMES A DAY AS NEEDED FOR PAIN, Disp: , Rfl:     traZODone (DESYREL) 100 MG tablet, TAKE 1 AND 1/2 TABLETS TO 2TABLETS BY MOUTH AT BEDTIME AS NEEDED FOR SLEEP, Disp: , Rfl:     traZODone (DESYREL) 100 MG tablet, , Disp: , Rfl:       Review of Systems      Physical Examination:  Ht 5\' 6"  (1.676 m)    Wt 197 lb (89.4 kg)    BMI 31.80 kg/m??    Mild antalgic gait  Well appearing male in no acute distress, alert and oriented x3, conversant with a normal affect. Responds to questions appropriately.  Appears stated age.   Head is normocephalic and atraumatic.   Pupils are equal and round bilaterally with anicteric sclera. EOMI.   Mucous membranes moist without ulceration. Oropharynx clear without lesions.  Trachea is midline without thyromegaly. Neck is supple without lymphadenopathy.   Respirations are unlabored. No distal cyanosis or clubbing.  Pulses 2+ throughout extremities with RRR.  Skin is warm, dry, and well perfused. No rashes or lesions. Brisk capillary refill.      Left    Extremity examination    Ankle tenderness to palpation anterior joint line of ankle    Skin over the foot and ankle is intact.    Neutral hindfoot alignment and moderate arch height.    Negative calcaneal squeeze, negative Tinel's, no pain over  the posterior tibial tendon Achilles tendon or peroneal tendons.    Negative anterior drawer.    Negative Silverskiold sign    Ankle range of motion 10 degrees dorsiflexion and 30 degrees plantar flexion. Inversion 25 eversion 15 degrees.    5/ 5 plantar flexion, dorsiflexion, inversion, eversion.    Sensation intact to light touch.           Imaging:   XR ANKLE LEFT (MIN 3 VIEWS) (CLINIC PERFORMED)  3 views left ankle show mild to moderate degenerative change consistent   with osteoarthritis.  History of oats procedure        XR ANKLE LEFT (MIN 3 VIEWS) (CLINIC PERFORMED)     Result Date: 01/23/2021  3 views left ankle show mild to moderate degenerative change consistent with osteoarthritis.  History of oats procedure        Assessment:       ICD-10-CM     1. Arthritis of left ankle  M19.072 XR ANKLE LEFT (MIN 3 VIEWS) (CLINIC PERFORMED)  External Referral To DME             Plan:      Treatment at this time:  1. Arthritis of left ankle  Assessment & Plan:   He unfortunately has continued to have significant pain status post oats procedure to his left ankle     He is failed conservative treatment including use of an [place] brace, activity modification, nostril anti-inflammatories, injections     At this point we have discussed the possibility of ankle arthroscopy with removal of the bone spurs as well as loose ossicles, microfracture of the OCD, possible lateral ligament reconstruction, peroneal tendon exploration with possible repair, as well as aspiration of proximal tibia for injection into the ankle.       I have discussed this may not solve all of his pain and symptoms but it is a motion sparing procedure we could attempt before an ankle fusion.      Today we reviewed risks and benefits of surgery.  We discussed the risk of surgical complications including pain, nerve injury, vascular injury, infection, need for additional surgery, postoperative blood clots, risks associated with anesthesia including but  not limited to death.  We addressed all questions at this time.

## 2021-04-30 NOTE — Anesthesia Pre-Procedure Evaluation (Signed)
Department of Anesthesiology  Preprocedure Note       Name:  Ronald Herrera   Age:  51 y.o.  DOB:  1970-09-30                                          MRN:  [id number]         Date:  04/30/2021      Surgeon: Moishe Spice):  Baxter Hire, MD    Procedure: Procedure(s):  LEFT TIBIOTALAR ARTHRODESIS WITH ASPIRATION PROXIMAL TIBIA    Medications prior to admission:   Prior to Admission medications    Medication Sig Start Date End Date Taking? Authorizing Provider   ondansetron (ZOFRAN) 4 MG tablet Take 1 tablet by mouth 3 times daily as needed for Nausea or Vomiting 04/28/21   Diana Eves, PA   aspirin 325 MG EC tablet Take 1 tablet by mouth daily for 14 days Take daily in the morning with food to prevent blood clot formation. 04/28/21 05/12/21  Diana Eves, PA   HYDROcodone-acetaminophen (NORCO) 7.5-325 MG per tablet Take 2 tablets by mouth every 6 hours as needed for Pain for up to 5 days. Intended supply: 5 days. Take lowest dose possible to manage pain Max Daily Amount: 8 tablets 04/28/21 05/03/21  Diana Eves, PA   aspirin 81 MG chewable tablet Take 81 mg by mouth POST OP MEDICATIONS  Patient not taking: Reported on 04/20/2021 06/29/19   Historical Provider, MD   gabapentin (NEURONTIN) 300 MG capsule Take 600 mg by mouth 3 times daily. 01/20/21   Historical Provider, MD   pantoprazole (PROTONIX) 40 MG tablet Take 40 mg by mouth every morning 09/18/20   Historical Provider, MD   sildenafil (VIAGRA) 100 MG tablet Take 100 mg by mouth as needed for Erectile Dysfunction  Patient not taking: Reported on 04/20/2021 10/02/20 10/03/21  Historical Provider, MD   sucralfate (CARAFATE) 1 GM tablet Take 1 g by mouth in the morning and at bedtime 11/11/20   Historical Provider, MD   tadalafil (CIALIS) 20 MG tablet Take 20 mg by mouth as needed for Erectile Dysfunction 01/20/21   Historical Provider, MD   acyclovir (ZOVIRAX) 400 MG tablet Take 400 mg by mouth 2 times daily 04/29/20 04/30/21  Historical Provider, MD   LUBRICATING PLUS  EYE DROPS 0.5 % SOLN ophthalmic solution  09/11/20   Historical Provider, MD   cetirizine (ZYRTEC) 10 MG tablet Take 10 mg by mouth every morning 06/10/20   Historical Provider, MD   clotrimazole (LOTRIMIN) 1 % cream Apply topically at bedtime 09/08/20   Historical Provider, MD   DULoxetine (CYMBALTA) 60 MG extended release capsule Take 90 mg by mouth every morning 02/15/20 04/30/21  Historical Provider, MD   ergocalciferol (ERGOCALCIFEROL) 1.25 MG (50000 UT) capsule Take 50,000 Units by mouth once a week MD ADVISED.  [facility] 03/04/20 04/30/21  Historical Provider, MD   hydroCHLOROthiazide (HYDRODIURIL) 25 MG tablet Take 25 mg by mouth every morning 09/08/20   Historical Provider, MD   losartan (COZAAR) 100 MG tablet TAKE ONE-HALF TABLET BY MOUTH EVERY DAY 11/23/19 04/30/21  Historical Provider, MD   losartan (COZAAR) 100 MG tablet Take 100 mg by mouth every morning 09/08/20   Historical Provider, MD   olopatadine (PATANOL) 0.1 % ophthalmic solution Place 1 drop into both eyes as needed 09/08/20   Historical Provider, MD   traZODone (DESYREL)  100 MG tablet Take 100 mg by mouth nightly 06/10/20 07/03/21  Historical Provider, MD       Current medications:    Current Facility-Administered Medications   Medication Dose Route Frequency Provider Last Rate Last Admin   ??? ceFAZolin (ANCEF) 2000 mg in dextrose 3 % 50 mL IVPB (duplex)  2,000 mg IntraVENous On Call to OR Diana Eves, PA       ??? lactated ringers IV soln infusion   IntraVENous Continuous Diana Eves, PA       ??? sodium chloride flush 0.9 % injection 5-40 mL  5-40 mL IntraVENous 2 times per day Diana Eves, PA       ??? sodium chloride flush 0.9 % injection 5-40 mL  5-40 mL IntraVENous PRN Diana Eves, PA       ??? 0.9 % sodium chloride infusion   IntraVENous PRN Diana Eves, PA       ??? midazolam (VERSED) 2 MG/2ML injection            ??? fentaNYL (SUBLIMAZE) 100 MCG/2ML injection                Allergies:    Allergies   Allergen Reactions   ??? Bee  Pollen Shortness Of Breath   ??? Grass Pollen(K-O-R-T-Swt Vern) Shortness Of Breath and Other (See Comments)   ??? Pollen Extract Shortness Of Breath and Other (See Comments)   ??? Bromfenac      Other reaction(s): Hematochezia   ??? Nsaids      Other reaction(s): Hematochezia  Other reaction(s): Hematochezia   ??? Oxycodone    ??? Oxycodone-Acetaminophen Anxiety     Other reaction(s): Short Tempered  Other reaction(s): MAKES PATIENT SHORT TEMPERED  Event:       ??? Percocet [Oxycodone-Acetaminophen] Other (See Comments)     Irritable       Problem List:    Patient Active Problem List   Diagnosis Code   ??? Chronic pain of left ankle M25.572, G89.29   ??? Chronic pain of right ankle M25.571, G89.29   ??? Ganglion cyst M67.40   ??? Primary osteoarthritis of both feet M19.071, M19.072   ??? Osteochondral lesion of talar dome M89.9, M94.9   ??? PUD (peptic ulcer disease) K27.9   ??? Patellofemoral syndrome of left knee M22.2X2   ??? Patellar tendinitis of left knee M76.52   ??? 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 Left foot pain M79.672   ??? Instability of left ankle joint M25.372   ??? Impingement syndrome, shoulder, left M75.42   ??? Fibromyalgia M79.7   ??? Arthritis of neck M47.812   ??? Abnormal MRI R93.89   ??? Age-related physical debility R54   ??? Alcohol abuse F10.10   ??? Alcohol dependence (HCC) F10.20   ??? Anxiety state F41.1   ??? 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 Cellulitis and abscess of face L03.211, L02.01   ??? Chalazion left upper eyelid H00.14   ??? Chronic post-traumatic stress disorder (PTSD) F43.12   ??? Posttraumatic stress disorder F43.10   ??? Post-traumatic stress disorder, chronic F43.12   ??? Constipation K59.00   ??? Fracture of phalanx of toe S92.919A   ??? Ganglion cyst M67.40   ??? 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 Homicidal thoughts R45.850   ??? Benign essential hypertension I10   ??? Essential (primary) hypertension I10   ??? Hypertension I10   ??? Primary hypertension I10   ???  Pain in  left ankle and joints of left foot M25.572   ??? Joint pain M25.50   ??? Myalgia and myositis IMO0001   ??? Arthralgia of hand M25.549   ??? Nicotine dependence F17.200   ??? Painful spasm of anus K59.4   ??? Osteochondral lesion of talar dome M89.9, M94.9   ??? Flat foot (pes planus) (acquired), unspecified foot M21.40   ??? Pes planus M21.40   ??? Primary osteoarthritis of both feet M19.071, M19.072   ??? Reason for consultation IMO0001   ??? 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 Secondary osteoarthritis, left ankle and foot M19.272   ??? Nicotine dependence, other tobacco product, in remission F17.291   ??? Tobacco dependence in remission F17.201   ??? Tobacco use disorder F17.200   ??? Vitamin D deficiency E55.9   ??? Chronic ankle pain M25.579, G89.29   ??? 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 Pain in left knee M25.562   ??? Peptic ulcer disease with hemorrhage K27.4   ??? Arthritis of left ankle M19.072       Past Medical History:        Diagnosis Date   ??? Anxiety    ??? Arthritis     LEFT ANKLE   ??? BMI 32.0-32.9,adult    ??? COVID 2022   ??? Depression    ??? Fibromyalgia    ??? Generalized headache    ??? GERD (gastroesophageal reflux disease)    ??? Herpes    ??? Hiatal hernia    ??? Hyperlipidemia    ??? Hypertension    ??? Left ankle instability    ??? Seasonal allergies    ??? Unstable ankle, left    ??? Wears glasses        Past Surgical History:        Procedure Laterality Date   ??? ANKLE SURGERY Left 06/28/2019    left ankle partial resection tibia, partial resection fibula, brostrom gould lateral ankle ligament reconstruction, left ankle mosaicplasty with aosteochondral plug transfer from ipsilateral knee-dr. blake ohlson   ??? ENDOSCOPY, COLON, DIAGNOSTIC  04/09/2021   ??? FOOT SURGERY Left     IP fusion 12/2017   ??? TOE SURGERY  2019    Left foot IP [facility]desis [facility] [place]       Social History:    Social History     Tobacco Use   ??? Smoking status: Former     Packs/day: 1.00     Years: 20.00     Pack years: 20.00     Types:  Cigarettes     Quit date: 11/2020     Years since quitting: 0.4   ??? Smokeless tobacco: Never   Substance Use Topics   ??? Alcohol use: Yes     Comment: 15 DRINKS PER WEEK                                Counseling given: Not Answered      Vital Signs (Current):   Vitals:    04/20/21 1540 04/30/21 1036   BP:  (!) 120/94   Pulse:  74   Resp:  16   Temp:  97.5 ??F (36.4 ??C)   TempSrc:  Oral   SpO2:  99%   Weight: 212 lb (96.2 kg) 218 lb 4.1 oz (99 kg)   Height: 5' 7.5" (1.715 m) 5\' 7"  (1.702 m)  BP Readings from Last 3 Encounters:   04/30/21 (!) 120/94   04/16/19 122/89   09/07/18 122/75       NPO Status: Time of last liquid consumption: 2300                        Time of last solid consumption: 2300                        Date of last liquid consumption: 04/29/21                        Date of last solid food consumption: 04/29/21    BMI:   Wt Readings from Last 3 Encounters:   04/30/21 218 lb 4.1 oz (99 kg)   01/23/21 197 lb (89.4 kg)   09/15/20 197 lb (89.4 kg)     Body mass index is 34.18 kg/m??.    CBC: No results found for: WBC, RBC, HGB, HCT, MCV, RDW, PLT    CMP:   Lab Results   Component Value Date/Time    NA 140 05/14/2019 03:56 PM    K 4.0 04/29/2021 10:12 AM    CL 105 05/14/2019 03:56 PM    CO2 27 05/14/2019 03:56 PM    BUN 12 05/14/2019 03:56 PM    CREATININE 1.0 05/14/2019 03:56 PM    GFRAA 103 05/14/2019 03:56 PM    LABGLOM 89 05/14/2019 03:56 PM    GLUCOSE 92 05/14/2019 03:56 PM    PROT 7.3 05/14/2019 03:56 PM    CALCIUM 9.5 05/14/2019 03:56 PM    BILITOT 0.75 05/14/2019 03:56 PM    ALKPHOS 68 05/14/2019 03:56 PM    AST 19 05/14/2019 03:56 PM    ALT 33 05/14/2019 03:56 PM       POC Tests: No results for input(s): POCGLU, POCNA, POCK, POCCL, POCBUN, POCHEMO, POCHCT in the last 72 hours.    Coags: No results found for: PROTIME, INR, APTT    HCG (If Applicable): No results found for: PREGTESTUR, PREGSERUM, HCG, HCGQUANT     ABGs: No results found for: PHART, PO2ART,  PCO2ART, HCO3ART, BEART, O2SATART     Type & Screen (If Applicable):  No results found for: LABABO, LABRH    Drug/Infectious Status (If Applicable):  No results found for: HIV, HEPCAB    COVID-19 Screening (If Applicable): No results found for: COVID19        Anesthesia Evaluation  Patient summary reviewed and Nursing notes reviewed  Airway: Mallampati: I          Dental:          Pulmonary:normal exam                               Cardiovascular:                      Neuro/Psych:               GI/Hepatic/Renal:             Endo/Other:                     Abdominal:             Vascular:          Other Findings:           Anesthesia  Plan      general     ASA 2       Induction: inhalational and intravenous.      Anesthetic plan and risks discussed with patient.      Plan discussed with CRNA and surgical team.                    Genia Delandal Miles Demareon Coldwell, MD   04/30/2021

## 2021-04-30 NOTE — Anesthesia Procedure Notes (Signed)
Airway  Date/Time: 04/30/2021 12:05 PM  Urgency: elective    Airway not difficult    General Information and Staff    Patient location during procedure: OR  Anesthesiologist: Randal Virl Son, MD  Resident/CRNA: Michiel Cowboy, APRN - CRNA  Performed: resident/CRNA     Indications and Patient Condition  Indications for airway management: anesthesia  Sedation level: deep  Preoxygenated: yes  Patient position: sniffing      Final Airway Details  Final airway type: supraglottic airway      Successful airway: oropharyngeal  Size 5     Number of attempts at approach: 1  Ventilation between attempts: spontaneous    Additional Comments  ASA monitors applied. Patient preoxygenated. IV induction. Eyes taped. Easy mask ventilation. LMA placed easily, atraumatic. Positive ETCO2 detected, ETT secured. Patient positioned, head & neck supported. All pressure points padded.    no

## 2021-04-30 NOTE — Op Note (Signed)
Operative Note      Patient: Ronald Herrera  Date of Birth: 1971-02-01  MRN: [id number]    Date of Procedure: 04/30/2021    Pre-Op Diagnosis: Arthritis of left ankle [M19.072]  Left ankle osteochondral defect    Left ankle lateral ligament insufficiency    Left peroneal tendinitis  Post-Op Diagnosis:  Left ankle osteochondral defect  Left ankle impingement    Left distal tibia exostosis    Left fibula exostosis    Left calcaneus bossing  Left ankle lateral ligament insufficiency    Left peroneal tendinitis     Left superficial peroneal nerve entrapment        Procedure(s):  Left ankle arthroscopy with extensive debridement    Left osteochondral defect microfracture    Left proximal tibia bone graft harvest    Left distal tibia exostectomy    Left distal fibula exostectomy    Left partial excision of bone lateral wall calcaneus    Left peroneus longus and peroneus brevis tenosynovectomy    Left ankle lateral ligament reconstruction    Left superficial peroneal neurolysis      Surgeon(s):  Baxter Hire, MD    Assistant:   Physician Assistant: Diana Eves, PA    Anesthesia: General    Estimated Blood Loss (mL): Minimal    Complications: None    Specimens:   * No specimens in log *    Implants:  Implant Name Type Inv. Item Serial No. Manufacturer Lot No. LRB No. Used Action   ANCHOR LABRAL KNOTLESS 2.9MM X 8.25MM DBL LASSO - [id number]  ANCHOR LABRAL KNOTLESS 2.9MM X 8.25MM DBL LASSO  [employer] [id number] Left 2 Implanted         Drains: * No LDAs found *    Findings: Entrapment of superficial peroneal nerve    Detailed Description of Procedure:   The correct patient was identified in the preoperative holding area. The informed consent was reviewed with the patient. We discussed all risks and benefits of the procedure including the risk of postoperative pain, nerve injury, bleeding, infection, blood clots, need for additional surgery, risks associated with anesthesia including but not limited to death. We answered  all questions at this time.    Next the patient was transferred to the operating room and placed in a supine position.    Next the patient was induced under anesthesia.    Next left lower extremities prepped draped sterile fashion surgical timeout was formed    The made a small incision over Gertie's tubercle dissecting down to the proximal tibia.  We completed a corticotomy introducing the Jamshidi and aspirated 60 cc of bone marrow with bone graft.  We closed the incision in a layered fashion    Next we established anteromedial portal and introduced the scope.  There was extensive arthrofibrosis.  We established a lateral portal and introduced a shaver and completed an extensive debridement.  We identified an area of the previous osteochondral defect.  The cartilage was somewhat unstable.  I completed microfracture at this time    I extended the lateral incision and introduced a larger bur.  We then proceeded to complete a distal tibia exostectomy to remove impinging bone    Next we made an extensile incision over the lateral ankle dissecting down to the site of the previous lateral ligament reconstruction.  The ligaments were noted to be attenuated.  We identified 2 branches of the superficial peroneal nerve that were encased in scar.  We carefully  completed a neurolysis of the 2 branches of the nerve.  The nerve appeared to be in continuity    Releasing the nerve I then exposed the distal fibula.  There was an area concerning for an unstable exostosis of the distal fibula which I removed with a rongeur    In addition I removed a peroneal tubercle on the lateral wall the calcaneus completing partial excision of bone calcaneus    Next I used tenotomy's to carefully explore and release the peroneus longus and brevis from the bed of scar.  The tendons appear to be intact    Next we placed 2 anchors in the face the fibula and perform a Brostr??m lateral ligament reconstruction advancing the ATFL and CFL back to their  origins.  We oversewed the extensor retinaculum completing the Gould modification.  We copiously irrigated closed lateral incision a layered fashion    Edison Nasuti was present throughout the case and provided the most necessary assistance    Electronically signed by Baxter Hire, MD on 04/30/2021 at 1:55 PM

## 2021-04-30 NOTE — Anesthesia Procedure Notes (Signed)
Peripheral Block    Patient location during procedure: pre-op  Reason for block: procedure for pain, post-op pain management and at surgeon's request  Start time: 04/30/2021 11:22 AM  Staffing  Performed: anesthesiologist   Preanesthetic Checklist  Completed: patient identified, IV checked, site marked, risks and benefits discussed, surgical/procedural consents, equipment checked, pre-op evaluation, timeout performed, anesthesia consent given, oxygen available and monitors applied/VS acknowledged  Peripheral Block   Patient position: supine  Prep: ChloraPrep  Provider prep: mask  Patient monitoring: continuous pulse ox, IV access, oxygen and responsive to questions  Block type: Femoral  Femoral crease  Laterality: left  Injection technique: single-shot  Guidance: ultrasound guided    Needle   Needle type: short-bevel   Needle gauge: 20 G  Needle localization: anatomical landmarks and ultrasound guidance  Needle length: 8 cm  Assessment   Injection assessment: negative aspiration for heme, no paresthesia on injection, local visualized surrounding nerve on ultrasound and no intravascular symptoms  Paresthesia pain: none  Slow fractionated injection: yes  Hemodynamics: stable  Real-time [place] image taken/store: yes  Outcomes: uncomplicated and patient tolerated procedure well    Additional Notes  The surgeon has consulted the department of regional anesthesia to place a peripheral nerve block for post-operative pain management for this patient. If a PNB catheter is placed, infusion is anticipated for up to 72 hours post-operatively unless otherwise discussed.     Prior to the above procedure, the alternatives, risks, side effects, and potential complications (including anesthetic toxicity, seizures, death, temporary and/or permanent nerve injury, and loss of extremity function) of the planned single injection and/or continuous peripheral nerve block. All were thoroughly discussed, and the patient and/or guardian  acknowledge and accept all risks of the planned procedure.    Block completed under direct U/S guidance. Frequent negative aspiration during injection. No pain on injection, low injection pressure, no paresthesia, no evidence of hematoma, and no other complications unless otherwise noted.    PERFORMED BLOCK:  FEMORAL PERIPHERAL NERVE BLOCK      Medications Administered  ropivacaine (NAROPIN) injection 0.2% - Perineural   20 mL - 04/30/2021 11:22:00 AM

## 2021-04-30 NOTE — Discharge Instructions (Signed)
Joshua Lamb, MD  LOWER EXTREMITY SURGERY--POSTOPERATIVE INSTRUCTIONS  Pain Control:  You may or may not have had a nerve block depending on type of surgery. This will likely wear off in 12-36 hours. You may have increased pain as the block wears off. Please take your prescribed pain medication as directed with food prior to the nerve block wearing off.   Stay ahead of the pain.   Take prescription medication as directed  If there is no Tylenol (acetaminophen) in the prescribed pain medication, you may takeup to Tylenol 3g/ 24 hours, in addition to the prescribed narcotic.  Over the counter anti-inflammatory medication such as Advil or Aleve can be used to supplement narcotic pain medication as needed.  Narcotic pain medication can cause constipation.  Please take a stool softener while taking narcotics and drink plenty of water.  Please plan ahead! if you are running out of your pain medication prior to your followup appointment, please call during business hours with a 2-3 day notice. Perscription refills or changes cannot be addressed after normal business hours or on weekends.  Narcotics will be tapered after   Icing:  Ice pack/bag to lower extremity 20 minutes at a time, as much as you like, while awake.  Activity:  Elevate lower extremity above the heart to decrease swelling and pain.  ( X  ) Non-weight bearing with crutches  (   ) Touch down weight bearing  (   ) Partial weight bearing  (   ) Full weight bearing as tolerated with crutches as needed  Dressing/Splint:  ( X ) Leave splint on and keep clean/dry until follow up appointment  (   ) If no splint, change dressing 48 hours post surgery  You may shower and get the wounds wet, once there is no wound drainage  Do not submerge lower extremity in water or scrub incision sites  Cover wounds with dry band aids following dressing change or shower  Physical Therapy/Activity:  Will be scheduled at your follow up visit if indicated  ( X ) Begin active motion of  knee, ankle, toes to minimize swelling  Follow Up Appointment:  Call office for follow up appointment to be scheduled 1-2 weeks from date of surgery, if not already scheduled  Call office for temperature greater than 102 degrees Fahrenheit and for excessive swelling, pain, or redness around incision sites

## 2021-04-30 NOTE — Anesthesia Post-Procedure Evaluation (Signed)
Department of Anesthesiology  Postprocedure Note    Patient: Orlander Norwood  MRN: [id number]  Birthdate: 06/14/1970  Date of evaluation: 04/30/2021      Procedure Summary     Date: 04/30/21 Room / Location: [facility] OR[facility] / [facility]    Anesthesia Start: 1158 Anesthesia Stop: 1343    Procedure: LEFT TIBIOTALAR ARTHRODESIS WITH ASPIRATION PROXIMAL TIBIA, + Left ankle arthroscopy (Left: Ankle) Diagnosis:       Arthritis of left ankle      (Arthritis of left ankle [M19.072])    Surgeons: Baxter Hire, MD Responsible Provider: Genia Del, MD    Anesthesia Type: general ASA Status: 2          Anesthesia Type: No value filed.    Aldrete Phase I: Aldrete Score: 8    Aldrete Phase II:        Anesthesia Post Evaluation    Patient location during evaluation: PACU  Level of consciousness: awake  Airway patency: patent  Nausea & Vomiting: no nausea and no vomiting  Complications: no  Cardiovascular status: hypertensive  Respiratory status: acceptable  Hydration status: euvolemic

## 2021-05-01 NOTE — Telephone Encounter (Signed)
Patient had surgery yesterday on foot and says he feels pain on the bone along with tingling and needles, patient wants to know if this is normal, please call patient.

## 2021-05-01 NOTE — Telephone Encounter (Signed)
Called and spoke with the patient immediately after receiving this page.  He denies any irretractable pain.  He reports he has soreness and also a sensation of numbness and tingling and wanted to make sure this was okay.  I let him know that we expect pain postoperatively and that sometimes they will also experience numbness and tingling.  He denies any pallor or erythema, fever, increased swelling.  He reports that he has intact motor function of his lower extremity.  I let him know that if he continues to be concerned he could always reach out to the office and speak with a member of Dr. Laqueta Jean team but at this time what he is experiencing seems to be very normal

## 2021-05-11 ENCOUNTER — Encounter: Payer: PRIVATE HEALTH INSURANCE | Attending: Surgical

## 2021-05-12 ENCOUNTER — Encounter: Admit: 2021-05-12 | Discharge: 2021-05-12 | Payer: PRIVATE HEALTH INSURANCE | Attending: Surgical

## 2021-05-12 DIAGNOSIS — M19072 Primary osteoarthritis, left ankle and foot: Secondary | ICD-10-CM

## 2021-05-12 MED ORDER — OXYCODONE HCL 5 MG PO TABS
5 MG | ORAL_TABLET | ORAL | 0 refills | Status: AC | PRN
Start: 2021-05-12 — End: 2021-05-19

## 2021-05-12 MED ORDER — PREGABALIN 75 MG PO CAPS
75 MG | ORAL_CAPSULE | Freq: Two times a day (BID) | ORAL | 1 refills | Status: DC
Start: 2021-05-12 — End: 2021-05-12

## 2021-05-12 MED ORDER — OXYCODONE HCL 5 MG PO TABS
5 MG | ORAL_TABLET | ORAL | 0 refills | Status: DC | PRN
Start: 2021-05-12 — End: 2021-05-12

## 2021-05-12 MED ORDER — PREGABALIN 75 MG PO CAPS
75 MG | ORAL_CAPSULE | Freq: Two times a day (BID) | ORAL | 1 refills | Status: DC
Start: 2021-05-12 — End: 2021-06-22

## 2021-05-12 NOTE — Progress Notes (Signed)
ORTHOPAEDICS      SUBJECTIVE        Patient ID: Ronald Herrera is a 51 y.o. male     CHIEF COMPLAINT     CC: Date of surgery:04/30/2021         - s/p left ankle arthroscopy, microfracture OCD lesion, lateral ligament reconstruction, peroneal tendon debridement, superficial peroneal nerve neurolysis    HISTORY OF PRESENT ILLNESS    Patient presents for his first post operative visit for his left ankle. Patient complains of significant post operative pain, he has been compliant with his non weightbearing status on the left ankle following surgery.  Patient complains of severe postoperative pain that is not relieved with hydrocodone, he also complains of burning sensation and hypersensitivity over the lateral ankle and foot.  Patient does already take a maximum dose of gabapentin which she has taken for a long period of time.  Patient denies fever, chills or night sweats.  Denies calf pain, exertional shortness of breath or cough.    PAST MEDICAL HISTORY     Past Medical History:   Diagnosis Date    Anxiety     Arthritis     LEFT ANKLE    BMI 32.0-32.9,adult     COVID 2022    Depression     Fibromyalgia     Generalized headache     GERD (gastroesophageal reflux disease)     Herpes     Hiatal hernia     Hyperlipidemia     Hypertension     Left ankle instability     Seasonal allergies     Unstable ankle, left     Wears glasses        SURGICAL HISTORY       Past Surgical History:   Procedure Laterality Date    ANKLE SURGERY Left 06/28/2019    left ankle partial resection tibia, partial resection fibula, brostrom gould lateral ankle ligament reconstruction, left ankle mosaicplasty with aosteochondral plug transfer from ipsilateral knee-dr. blake ohlson    ANKLE SURGERY Left 04/30/2021    LEFT TIBIOTALAR ARTHRODESIS WITH ASPIRATION PROXIMAL TIBIA, + Left ankle arthroscopy performed by Baxter HireJoshua H Lamb, MD at [facility]    ENDOSCOPY, COLON, DIAGNOSTIC  04/09/2021    FOOT SURGERY Left     IP fusion 12/2017     TOE SURGERY  2019    Left foot [facility] [place]       CURRENT MEDICATIONS       Previous Medications    ASPIRIN 325 MG EC TABLET    Take 1 tablet by mouth daily for 14 days Take daily in the morning with food to prevent blood clot formation.    CETIRIZINE (ZYRTEC) 10 MG TABLET    Take 10 mg by mouth every morning    CLOTRIMAZOLE (LOTRIMIN) 1 % CREAM    Apply topically at bedtime    DULOXETINE (CYMBALTA) 60 MG EXTENDED RELEASE CAPSULE    Take 90 mg by mouth every morning    ERGOCALCIFEROL (ERGOCALCIFEROL) 1.25 MG (50000 UT) CAPSULE    Take 50,000 Units by mouth once a week MD ADVISED.  MONDAY    GABAPENTIN (NEURONTIN) 300 MG CAPSULE    Take 600 mg by mouth 3 times daily.    HYDROCHLOROTHIAZIDE (HYDRODIURIL) 25 MG TABLET    Take 25 mg by mouth every morning    LOSARTAN (COZAAR) 100 MG TABLET    TAKE ONE-HALF TABLET BY MOUTH EVERY DAY  LOSARTAN (COZAAR) 100 MG TABLET    Take 100 mg by mouth every morning    LUBRICATING PLUS EYE DROPS 0.5 % SOLN OPHTHALMIC SOLUTION        OLOPATADINE (PATANOL) 0.1 % OPHTHALMIC SOLUTION    Place 1 drop into both eyes as needed    ONDANSETRON (ZOFRAN) 4 MG TABLET    Take 1 tablet by mouth 3 times daily as needed for Nausea or Vomiting    PANTOPRAZOLE (PROTONIX) 40 MG TABLET    Take 40 mg by mouth every morning    SILDENAFIL (VIAGRA) 100 MG TABLET    Take 100 mg by mouth as needed for Erectile Dysfunction    SUCRALFATE (CARAFATE) 1 GM TABLET    Take 1 g by mouth in the morning and at bedtime    TADALAFIL (CIALIS) 20 MG TABLET    Take 20 mg by mouth as needed for Erectile Dysfunction    TRAZODONE (DESYREL) 100 MG TABLET    Take 100 mg by mouth nightly       ALLERGIES     Bee pollen, Grass pollen([id number] vern), Pollen extract, Bromfenac, Nsaids, Oxycodone, Oxycodone-acetaminophen, and Percocet [oxycodone-acetaminophen]    FAMILY HISTORY       Family History   Problem Relation Age of Onset    Diabetes Mother         SOCIAL HISTORY       Social History      Socioeconomic History    Marital status: Married   Tobacco Use    Smoking status: Former     Packs/day: 1.00     Years: 15.00     Pack years: 15.00     Types: Cigarettes     Start date: 02/16/1991     Quit date: 12/02/2020     Years since quitting: 0.4    Smokeless tobacco: Never   Substance and Sexual Activity    Alcohol use: Yes     Alcohol/week: 4.0 standard drinks     Types: 4 Cans of beer per week     Comment: 15 DRINKS PER WEEK    Drug use: Yes     Frequency: 3.0 times per week     Types: Marijuana Sheran Fava)    Sexual activity: Yes     Partners: Female   Social History Narrative    ** Merged History Encounter **            OBJECTIVE     Examination of the left ankle shows incisions clean, dry and intact.  No active drainage or surrounding erythema, no sign of infection.  Most distal aspect of the lateral ankle incision is not quite fully healed.  No evidence of wound dehiscence.  Swelling diffusely, ankle well aligned.  Significant hypersensitivity in the distribution of the superficial peroneal nerve.  Painful range of motion of the ankle with passive ankle dorsiflexion and plantarflexion. Palpable dorsalis pedis pulse.    ASSESSMENT      1. Arthritis of left ankle    2. Orthopedic aftercare    3. Ankle instability, left    4. Neuritis      PLAN   Patient presents for his first postoperative visit for his left ankle.  Sutures and staples left intact given the significant amount of hypersensitivity patient is experiencing and tenderness on examination.  Most distal aspect of the lateral incision is not quite fully healed as well.  Patient fitted with dry dressing.  Patient is very apprehensive about wearing a cam walker  boot due to tenderness over the lateral ankle incision.  Patient was refitted with his postoperative splint and ankle padded in a manner to avoid any rubbing or excess pressure over the lateral incision.  Patient states that the splint is now more comfortable for him.  He may change the dressing  every few days as needed as long as he reapply the splint and remain strict nonweightbearing.  Instructed to keep the incisions clean and dry.      Patient's postoperative pain medication switched to oxycodone since he says the hydrocodone is not providing any relief of symptoms.  Patient may also try Lyrica 75 mg twice daily instead of his current gabapentin regimen to see if this helps his symptoms of neuritis more so than the gabapentin   is currently helping.  If he does not notice any improvement with the Lyrica he may discontinue Lyrica and resume his gabapentin regimen.    Patient will follow-up in 5 to 7 days for repeat examination and removal of sutures and staples.  All of his questions were answered, call the office with questions or concerns.

## 2021-05-14 NOTE — Telephone Encounter (Signed)
Renae Fickle with the [facility] called and said Jae Dire would need to send the prescriptions to the [place], due to where the patient was referred out from. He said the contact is Lum Keas, [phone] ext. [phone].

## 2021-05-18 ENCOUNTER — Encounter: Admit: 2021-05-18 | Discharge: 2021-05-18 | Payer: PRIVATE HEALTH INSURANCE | Attending: Surgical

## 2021-05-18 DIAGNOSIS — M19072 Primary osteoarthritis, left ankle and foot: Secondary | ICD-10-CM

## 2021-05-18 MED ORDER — NALOXONE HCL 4 MG/0.1ML NA LIQD
4 | NASAL | 1 refills | Status: AC | PRN
Start: 2021-05-18 — End: ?

## 2021-05-18 MED ORDER — ASPIRIN EC 325 MG PO TBEC
325 MG | ORAL_TABLET | Freq: Every day | ORAL | 1 refills | Status: DC
Start: 2021-05-18 — End: 2021-06-22

## 2021-05-18 MED ORDER — OXYCODONE HCL 5 MG PO TABS
5 MG | ORAL_TABLET | Freq: Four times a day (QID) | ORAL | 0 refills | Status: AC | PRN
Start: 2021-05-18 — End: 2021-05-25

## 2021-05-18 NOTE — Progress Notes (Signed)
ORTHOPAEDICS      SUBJECTIVE        Patient ID: Ronald Herrera is a 51 y.o. male     CHIEF COMPLAINT     CC: Date of surgery:04/30/2021         - s/p left ankle arthroscopy, microfracture OCD lesion, lateral ligament reconstruction, peroneal tendon debridement, superficial peroneal nerve neurolysis         - suture and staple removal    HISTORY OF PRESENT ILLNESS    Patient presents for follow up visit for his left ankle, wound check and suture and staple removal. Patient complains of significant post operative pain. He says he has been compliant with his non weightbearing status on the left ankle. Complains of significant burning and hypersensitivity over the outside of the ankle. Patient has been taking Gabapentin for years and recently switched to Lyrica in the last day or two, he has not been able to notice if the Lyrica helps him symptoms more than the Gabapentin yet since he has only been taking it for the last 1 to 2 days. Denies fever, chills or night sweats.    PAST MEDICAL HISTORY     Past Medical History:   Diagnosis Date    Anxiety     Arthritis     LEFT ANKLE    BMI 32.0-32.9,adult     COVID 2022    Depression     Fibromyalgia     Generalized headache     GERD (gastroesophageal reflux disease)     Herpes     Hiatal hernia     Hyperlipidemia     Hypertension     Left ankle instability     Seasonal allergies     Unstable ankle, left     Wears glasses        SURGICAL HISTORY       Past Surgical History:   Procedure Laterality Date    ANKLE SURGERY Left 06/28/2019    left ankle partial resection tibia, partial resection fibula, brostrom gould lateral ankle ligament reconstruction, left ankle mosaicplasty with aosteochondral plug transfer from ipsilateral knee-dr. blake ohlson    ANKLE SURGERY Left 04/30/2021    LEFT TIBIOTALAR ARTHRODESIS WITH ASPIRATION PROXIMAL TIBIA, + Left ankle arthroscopy performed by Baxter HireJoshua H Lamb, MD at [facility]    ENDOSCOPY, COLON, DIAGNOSTIC  04/09/2021    FOOT  SURGERY Left     IP fusion 12/2017    TOE SURGERY  2019    Left foot [facility] [place]       CURRENT MEDICATIONS       Previous Medications    ASPIRIN 325 MG EC TABLET    Take 1 tablet by mouth daily for 14 days Take daily in the morning with food to prevent blood clot formation.    CETIRIZINE (ZYRTEC) 10 MG TABLET    Take 10 mg by mouth every morning    CLOTRIMAZOLE (LOTRIMIN) 1 % CREAM    Apply topically at bedtime    DULOXETINE (CYMBALTA) 60 MG EXTENDED RELEASE CAPSULE    Take 90 mg by mouth every morning    ERGOCALCIFEROL (ERGOCALCIFEROL) 1.25 MG (50000 UT) CAPSULE    Take 50,000 Units by mouth once a week MD ADVISED.  MONDAY    GABAPENTIN (NEURONTIN) 300 MG CAPSULE    Take 600 mg by mouth 3 times daily.    HYDROCHLOROTHIAZIDE (HYDRODIURIL) 25 MG TABLET    Take 25 mg by mouth every  morning    LOSARTAN (COZAAR) 100 MG TABLET    TAKE ONE-HALF TABLET BY MOUTH EVERY DAY    LOSARTAN (COZAAR) 100 MG TABLET    Take 100 mg by mouth every morning    LUBRICATING PLUS EYE DROPS 0.5 % SOLN OPHTHALMIC SOLUTION        OLOPATADINE (PATANOL) 0.1 % OPHTHALMIC SOLUTION    Place 1 drop into both eyes as needed    ONDANSETRON (ZOFRAN) 4 MG TABLET    Take 1 tablet by mouth 3 times daily as needed for Nausea or Vomiting    OXYCODONE (ROXICODONE) 5 MG IMMEDIATE RELEASE TABLET    Take 1 tablet by mouth every 4 hours as needed for Pain for up to 7 days. Intended supply: 7 days. Take lowest dose possible to manage pain Max Daily Amount: 30 mg    PANTOPRAZOLE (PROTONIX) 40 MG TABLET    Take 40 mg by mouth every morning    PREGABALIN (LYRICA) 75 MG CAPSULE    Take 1 capsule by mouth 2 times daily for 30 days. Max Daily Amount: 150 mg    SILDENAFIL (VIAGRA) 100 MG TABLET    Take 100 mg by mouth as needed for Erectile Dysfunction    SUCRALFATE (CARAFATE) 1 GM TABLET    Take 1 g by mouth in the morning and at bedtime    TADALAFIL (CIALIS) 20 MG TABLET    Take 20 mg by mouth as needed for Erectile Dysfunction     TRAZODONE (DESYREL) 100 MG TABLET    Take 100 mg by mouth nightly       ALLERGIES     Bee pollen, Grass pollen([id number] vern), Pollen extract, Bromfenac, Nsaids, Oxycodone, Oxycodone-acetaminophen, and Percocet [oxycodone-acetaminophen]    FAMILY HISTORY       Family History   Problem Relation Age of Onset    Diabetes Mother         SOCIAL HISTORY       Social History     Socioeconomic History    Marital status: Married   Tobacco Use    Smoking status: Former     Packs/day: 1.00     Years: 15.00     Pack years: 15.00     Types: Cigarettes     Start date: 02/16/1991     Quit date: 12/02/2020     Years since quitting: 0.4    Smokeless tobacco: Never   Substance and Sexual Activity    Alcohol use: Yes     Alcohol/week: 4.0 standard drinks     Types: 4 Cans of beer per week     Comment: 15 DRINKS PER WEEK    Drug use: Yes     Frequency: 3.0 times per week     Types: Marijuana Sheran Fava)    Sexual activity: Yes     Partners: Female   Social History Narrative    ** Merged History Encounter **            OBJECTIVE     Examination of the left ankle shows incisions clean, dry and intact. No active drainage or surrounding erythema, no sign of infection. Most distal aspect of the lateral ankle incision is not quite fully healed. No evidence of wound dehiscence. Swelling diffusely, ankle well aligned. Significant hypersensitivity in the distribution of the superficial peroneal nerve. Palpable dorsalis pedis pulse.      ASSESSMENT      1. Arthritis of left ankle    2. Orthopedic aftercare  3. Ankle instability, left    4. Neuritis      PLAN   Sutures and staples removed, fitted with dry dressing.  Fitted with cam walker boot at today's visit.  Continue strict nonweightbearing, boot full-time except for hygiene.  Continue Lyrica as therapeutic and diagnostic trial.  Instructed to diminish use of pain medication.  Ice and elevation as needed for pain relief.  Patient understands it may take several months or up to a year or longer  for symptoms to fully plateau following surgery.  Progressive weightbearing in the boot and physical therapy will begin at 6 weeks postop.  Continue aspirin for DVT prophylaxis since the patient has not been mobilizing very much.  All of his questions were answered, call the office with questions or concerns.  Follow-up at 6 weeks post op.

## 2021-06-08 ENCOUNTER — Ambulatory Visit: Admit: 2021-06-08 | Discharge: 2021-06-08 | Payer: PRIVATE HEALTH INSURANCE | Attending: Surgical

## 2021-06-08 DIAGNOSIS — M19072 Primary osteoarthritis, left ankle and foot: Secondary | ICD-10-CM

## 2021-06-08 MED ORDER — OXYCODONE HCL 5 MG PO TABS
5 MG | ORAL_TABLET | Freq: Four times a day (QID) | ORAL | 0 refills | Status: AC | PRN
Start: 2021-06-08 — End: 2021-06-15

## 2021-06-08 NOTE — Progress Notes (Signed)
ORTHOPAEDICS      SUBJECTIVE        Patient ID: Ronald Herrera is a 51 y.o. male     CHIEF COMPLAINT     CC: Date of surgery: 04/30/2021         - s/p left ankle arthroscopy, microfracture OCD lesion, lateral ligament reconstruction, peroneal tendon debridement, superficial peroneal nerve neurolysis    HISTORY OF PRESENT ILLNESS    Patient presents for follow up for his left ankle, patient is almost 6 weeks out from his date of surgery.  Patient has been compliant with his nonweightbearing status.  He has not been able to transition out of the splint into the cam walker boot due to significant severe hypersensitivity over the lateral ankle and top of the foot.  Complaints of severe postoperative pain.  Denies fever, chills or night sweats.  Denies calf pain, shortness of breath, cough, fever, chills or night sweats.    Patient was taking a fairly maximum dose of gabapentin prior to surgery.  He has tried stopping the gabapentin and taking Lyrica postoperatively however this has not helped his symptoms of hypersensitivity and neuritis at all.  Patient has been elevating the lower extremity for pain relief with minimal improvement.    PAST MEDICAL HISTORY     Past Medical History:   Diagnosis Date    Anxiety     Arthritis     LEFT ANKLE    BMI 32.0-32.9,adult     COVID 2022    Depression     Fibromyalgia     Generalized headache     GERD (gastroesophageal reflux disease)     Herpes     Hiatal hernia     Hyperlipidemia     Hypertension     Left ankle instability     Seasonal allergies     Unstable ankle, left     Wears glasses        SURGICAL HISTORY       Past Surgical History:   Procedure Laterality Date    ANKLE SURGERY Left 06/28/2019    left ankle partial resection tibia, partial resection fibula, brostrom gould lateral ankle ligament reconstruction, left ankle mosaicplasty with aosteochondral plug transfer from ipsilateral knee-dr. blake ohlson    ANKLE SURGERY Left 04/30/2021    LEFT TIBIOTALAR  ARTHRODESIS WITH ASPIRATION PROXIMAL TIBIA, + Left ankle arthroscopy performed by Baxter HireJoshua H Lamb, MD at [facility]    ENDOSCOPY, COLON, DIAGNOSTIC  04/09/2021    FOOT SURGERY Left     IP fusion 12/2017    TOE SURGERY  2019    Left foot [facility] [place]       CURRENT MEDICATIONS       Previous Medications    ASPIRIN 325 MG EC TABLET    Take 1 tablet by mouth daily for 14 days Take daily in the morning with food to prevent blood clot formation.    ASPIRIN 325 MG EC TABLET    Take 1 tablet by mouth daily for 21 days    CETIRIZINE (ZYRTEC) 10 MG TABLET    Take 1 tablet by mouth every morning    CLOTRIMAZOLE (LOTRIMIN) 1 % CREAM    Apply topically at bedtime    DULOXETINE (CYMBALTA) 60 MG EXTENDED RELEASE CAPSULE    Take 90 mg by mouth every morning    ERGOCALCIFEROL (ERGOCALCIFEROL) 1.25 MG (50000 UT) CAPSULE    Take 50,000 Units by mouth once a week  MD ADVISED.  MONDAY    GABAPENTIN (NEURONTIN) 300 MG CAPSULE    Take 2 capsules by mouth 3 times daily.    HYDROCHLOROTHIAZIDE (HYDRODIURIL) 25 MG TABLET    Take 1 tablet by mouth every morning    LOSARTAN (COZAAR) 100 MG TABLET    TAKE ONE-HALF TABLET BY MOUTH EVERY DAY    LOSARTAN (COZAAR) 100 MG TABLET    Take 1 tablet by mouth every morning    LUBRICATING PLUS EYE DROPS 0.5 % SOLN OPHTHALMIC SOLUTION        NALOXONE (NARCAN) 4 MG/0.1ML LIQD NASAL SPRAY    1 spray by Nasal route as needed for Opioid Reversal    OLOPATADINE (PATANOL) 0.1 % OPHTHALMIC SOLUTION    Place 1 drop into both eyes as needed    ONDANSETRON (ZOFRAN) 4 MG TABLET    Take 1 tablet by mouth 3 times daily as needed for Nausea or Vomiting    PANTOPRAZOLE (PROTONIX) 40 MG TABLET    Take 1 tablet by mouth every morning    PREGABALIN (LYRICA) 75 MG CAPSULE    Take 1 capsule by mouth 2 times daily for 30 days. Max Daily Amount: 150 mg    SILDENAFIL (VIAGRA) 100 MG TABLET    Take 1 tablet by mouth as needed for Erectile Dysfunction    SUCRALFATE (CARAFATE) 1 GM TABLET     Take 1 tablet by mouth in the morning and at bedtime    TADALAFIL (CIALIS) 20 MG TABLET    Take 1 tablet by mouth as needed for Erectile Dysfunction    TRAZODONE (DESYREL) 100 MG TABLET    Take 1 tablet by mouth nightly       ALLERGIES     Bee pollen, Grass pollen([id number]-t-swt vern), Pollen extract, Bromfenac, Nsaids, Oxycodone, Oxycodone-acetaminophen, and Percocet [oxycodone-acetaminophen]    FAMILY HISTORY       Family History   Problem Relation Age of Onset    Diabetes Mother         SOCIAL HISTORY       Social History     Socioeconomic History    Marital status: Married   Tobacco Use    Smoking status: Former     Packs/day: 1.00     Years: 15.00     Pack years: 15.00     Types: Cigarettes     Start date: 02/16/1991     Quit date: 12/02/2020     Years since quitting: 0.5    Smokeless tobacco: Never   Substance and Sexual Activity    Alcohol use: Yes     Alcohol/week: 4.0 standard drinks     Types: 4 Cans of beer per week     Comment: 15 DRINKS PER WEEK    Drug use: Yes     Frequency: 3.0 times per week     Types: Marijuana Sheran Fava)    Sexual activity: Yes     Partners: Female   Social History Narrative    ** Merged History Encounter **            OBJECTIVE     Examination of the left ankle shows incisions clean, dry and intact. No active drainage or surrounding erythema, no sign of infection.  Incision well-healed.  Minor swelling diffusely, ankle well aligned.  Tenderness to palpation over the lateral ankle and dorsal midfoot, no improvement compared to previous examination.  Increased pain with attempted passive range of motion of the ankle and hindfoot.  Significant residual hypersensitivity  in the distribution of the superficial peroneal nerve over the lateral ankle and dorsolateral hindfoot and dorsal midfoot. Palpable dorsalis pedis pulse.    ASSESSMENT      1. Arthritis of left ankle    2. Orthopedic aftercare    3. Ankle instability, left    4. Neuritis      PLAN   Patient fitted with new cam walker boot at  today's visit, he states that the black boot is more comfortable than his gray boot and causes less area of pressure over the lateral ankle.  Patient may begin progressive weightbearing in the boot once he is 6 weeks out from his date of surgery.  Begin physical therapy 2-3 times a week for 6 weeks per lateral ligament reconstruction protocol.  Patient may take either the gabapentin or the Lyrica for symptoms of neuritis and hypersensitivity.  Patient instructed to diminish use of narcotic pain medication.  1 more prescription for postoperative pain control dispensed at today's visit.  Ice and elevation as needed, use of over-the-counter lidocaine gel or topical local anesthetic agents as needed.  Patient will follow-up in 4 weeks.  All of his questions were answered, call the office with questions or concerns.

## 2021-06-08 NOTE — Telephone Encounter (Signed)
Ronald Herrera can we close the encounter now?

## 2021-06-16 ENCOUNTER — Inpatient Hospital Stay: Admit: 2021-06-16 | Payer: PRIVATE HEALTH INSURANCE

## 2021-06-16 DIAGNOSIS — R269 Unspecified abnormalities of gait and mobility: Secondary | ICD-10-CM

## 2021-06-16 DIAGNOSIS — M19072 Primary osteoarthritis, left ankle and foot: Secondary | ICD-10-CM

## 2021-06-16 DIAGNOSIS — Z4789 Encounter for other orthopedic aftercare: Secondary | ICD-10-CM

## 2021-06-16 NOTE — Progress Notes (Signed)
Received message from PA, pt going to be referred to [facility] in [place]. Pt seen for eval only. Will d/c Pt services.

## 2021-06-16 NOTE — Progress Notes (Signed)
[address] [facility]   [address]  [place] [id number]  Phone: [phone]  Fax: [phone]  Outpatient Physical Therapy Evaluation Episode    Ronald Herrera   (51 y.o. 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 Ronald Herrera  has a past medical history of Anxiety, Arthritis, BMI 32.0-32.9,adult, COVID, Depression, Fibromyalgia, Generalized headache, GERD (gastroesophageal reflux disease), Herpes, Hiatal hernia, Hyperlipidemia, Hypertension, Left ankle instability, Seasonal allergies, Unstable ankle, left, and Wears glasses.  Mr. Ronald Herrera  has a past surgical history that includes Foot surgery (Left); Toe Surgery (2019); Ankle surgery (Left, 06/28/2019); Endoscopy, colon, diagnostic (04/09/2021); and Ankle surgery (Left, 04/30/2021).    Allergies Bee pollen, Grass pollen(k-[id number]-r-t-swt vern), Pollen extract, Bromfenac, Nsaids, Oxycodone, Oxycodone-acetaminophen, and Percocet [oxycodone-acetaminophen]    Learning   Learning  Does the patient/guardian have any barriers to learning?: No barriers    Restrictions/Precautions   Restrictions/Precautions  Restrictions/Precautions: Weight Bearing  Position Activity Restriction  Other position/activity  restrictions: SEE ORDER IN CHART RE RESTRICTIONS  SUBJECTIVE   Subjective  Subjective: Marland Kitchen. Pt  with football injury 25 years ago in [employer]. Pt had cartiledge taken from L knee for L ankle in 2021 by Dr. Corky DownsLSEN. Pt s/p Left ankle arthroscopy with extensive debridement    Left osteochondral defect microfracture    Left proximal tibia bone graft harvest    Left distal tibia exostectomy    Left distal fibula exostectomy    Left partial excision of bone lateral wall calcaneus    Left peroneus longus and peroneus brevis tenosynovectomy    Left ankle lateral ligament reconstruction    Left superficial peroneal neurolysis   on 04/30/2021 by Dr. Randa LynnLamb.  Pt states he is now permitted to put weight on L ankle in boot but unable to do due to  pain.Pt only able to rest leg on ground. Pt's incision is heeling and no signs of infection. Pt lateral L Lower extremity with increased sensivity to touch and has padded CAM boot. Pt using crutches for amb and is not attempting to WB on LE.    Prior Level of Function/Work/Activity   Prior level of function: Pt not working since 2021  Prior level of function: Independent  Current level of function: Amb With crutches  ADL Assistance: Needs assistance  Bath: Minimal assistance  Toileting: Independent  Homemaking Assistance: Needs assistanceAmbulation Assistance: Independent (Impulsive at time with crutches)  Active Driver: No      Social History  Social History  Lives With: Spouse  Type of Home: House  Home Layout: One level  Home Access: Stairs to enter with rails  Entrance Stairs - Rails: None  Entrance Stairs - Number of Steps: 4  Bathroom Toilet: Midwife: Buyer, retail: Crutches, Cane    Pain  Pain Screening  Patient Currently in Pain: Yes  Pain Assessment: 0-10  Worst Pain Level: 10  Pain Type: Surgical pain, Neuropathic pain  Pain Location: Ankle  Pain Orientation: Left  Pain Descriptors: Sharp, Other (Comment) (Slicing)   OBJECTIVE   Observation:      AROM    AROM RLE (degrees)  R Ankle Dorsiflexion (0-20): 18  R Ankle Plantar Flexion (0-45): 34  R Ankle Forefoot Inversion (0-40): 34  R Ankle Forefoot Eversion (0-20): 18    AROM LLE (degrees)  LLE AROM : Exceptions  L Ankle Dorsiflexion (0-20): lacking 5  L Ankle Plantar Flexion (0-45): 12  L Ankle Forefoot Inversion (0-40): 6  L Ankle Forefoot Eversion (0-20): 4            Sensation  Overall Sensation Status: Impaired (hypersenstive to touch lateral L ankle)          LEMMT                         R Ankle Dorsiflexion: 5/5  R Ankle Plantar flexion: 5/5  R Ankle Inversion: 5/5  R Ankle Eversion: 5/5                                     L Ankle Dorsiflexion: 2-/5  L Ankle Plantar Flexion: 2-/5  L Ankle Inversion: 2-/5  L Ankle Eversion: 2-/5                    Functional Outcome Measures:    Foot and Ankle Ability Measure (FAAM)  Foot and Ankle Ability Measure (FAAM)  Standing: 2 (Moderate Difficulty)  Walking on even ground: 1 (Extreme Difficulty)  Walking on even ground without shoes: 1 (Extreme Difficulty)  Walking up hills: 1 (Extreme Difficulty)  Walking down hills: 1 (Extreme Difficulty)  Going up stairs: 1 (Extreme Difficulty)  Going down stairs: 1 (Extreme Difficulty)  Walking on uneven ground: 1 (Extreme Difficulty)  Stepping up and down curbs: 1 (Extreme Difficulty)  Squatting: 1 (Extreme Difficulty)  Coming up on your toes: 1 (Extreme Difficulty)  Walking initially: 1 (Extreme Difficulty)  Walking 5 minutes or less: 1 (Extreme Difficulty)  Walking approximately 10 minutes: 1 (Extreme Difficulty)  Walking 15 minutes or greater: 1 (Extreme Difficulty)  Home Responsibilities: 1 (Extreme Difficulty)  Activities of daily living: 1 (Extreme Difficulty)  Personal Care: 1 (Extreme Difficulty)  Light to moderate work (standing, walking): 1 (Extreme Difficulty)  Heavy Work (push/pulling, climbing, carrying): 1 (Extreme Difficulty)  Recreational Activities: 1 (Extreme Difficulty)  Total FAAM Score (out of 84):  22     Current Score (FAAM): 22 / 84 (Date: 06/17/2021)  Current Score (FADI)-if applicable:   / 100 (Date: 06/17/2021)    Interpretation of Score: For the "Activities of Daily Living", there are 21 questions each scored on a 5 point scale with 0 representing "Unable to do" and 4 representing "No difficulty".  The lower the score, the greater the functional disability. 84/84 represents no disability.  Minimal detectable change is 5.7 points. The lower the  score, the greater the functional disability.   PT Treatment Completed:  N/A - Evaluation Only  ASSESSMENT   AssessmentPt is a 51 y/o male s/p L ankle Left ankle arthroscopy with extensive debridement    Left osteochondral defect microfracture    Left proximal tibia bone graft harvest    Left distal tibia exostectomy    Left distal fibula exostectomy    Left partial excision of bone lateral wall calcaneus    Left peroneus longus and peroneus brevis tenosynovectomy    Left ankle lateral ligament reconstruction    Left superficial peroneal neurolysis   on 3/16 by Dr Randa Lynn. Pt now able to progress to WBAT in boot but pt unable due to pain. Pt with decreased ankel ROM, strength and balance and increase pain that limit mobility. Pt ed on desensitization techniques for Lateral Ankle. Pt performing gentle progressive WT bear to L ankle with lateral weight shifts in standing as tolerated with crutches and pt to continue at home. Pt will benefit from skilled PT to progress functional mobility per MD restrictions and protocol.    Education  PT Education  PT Education: Goals, PT Role, Plan of Care, Evaluative findings, Insurance, Home Exercise Program, Precautions, IT sales professional, Weight-bearing Education, Investment banker, operational  Patient Education: Ed on increasing L LE WB and adjusted ctruches to fit     Impairments  Decreased functional mobility , Decreased ADL status, Decreased ROM, Decreased body mechanics, Decreased tolerance to work activity, Decreased strength, Decreased safe  awareness, Decreased balance, Decreased sensation, Decreased endurance, Increased pain    Therapy Prognosis  Excellent    Eval Complexity  Decision Making: Medium Complexity  PLAN   Interventions Planned(Treatment may consist of any combination of the following):    Current Treatment Recommendations: Strengthening, ROM, Balance training, Functional mobility training, Transfer training, Endurance training, Gait training, Stair training, Neuromuscular re-education, Manual, Pain management, Return to work related activity, Modalities, Home exercise program, Safety education & training, Patient/Caregiver education & training, Therapeutic activities  Modalities: Heat/Cold, E-stim - unattended    Next Visit Recommendation   AAROM L ANKLE, GAIT Training, Wt bearing    Goals  Patient Goal(s):    Short Term Goals Completed by 4 weeks Goal Status   Pt will report no greater than 5/10 pain with daily activity STG Goal 1 Status:: New  STG 1 Current Status:: 10   Pt will amb x 150 ft with L LE WBAT and AD as needed STG Goal 2 Status:: New  STG 2 Current Status:: NWB   Pt will improve FAAM to 14/84 STG Goal 3 Status:: New  STG 3 Current Status:: 6     STG Goal 4 Status:: New  STG 4 Current Status:: Pt will increase AROM of L to 10 degrees throughout   mod I HEP STG Goal 5 Status:: New                 Long Term Goals Completed by 12 weeks Goal Status   Pt will report no greater than 2/10 pain with daily activity. LTG Goal 1 Status:: New  LTG 1 Current Status:: 10   Pt will increase AROm of L ankle to WNL LTG Goal 2 Status:: New   Pt will improve FAAM score to greater than 40/84 LTG Goal 3 Status:: New  LTG 3 Current Status:: 6   Pt will increase L ankle strength to 5/5 to aide in normalizing gait. LTG Goal 4 Status:: New  LTG 4 Current Status:: 2-  Pt will amb x 500 ft with no device and no CAM BOOT when cleared by MD WBAT and no increase in pain and ascned and descend 12 steps with reciprocal gait with mod I and no rail LTG Goal  5 Status:: New  LTG 5 Current Status:: NWB                 Total Duration   Time In: 1445  Time Out: 1530  Insurance Visit Details   Charge Capture     Therapist Signature: Myrtie Neither, PT    Date: 06/18/6142     I certify that the above therapy services are being furnished while the patient is under my care. I agree with the treatment plan and certify that this therapy is necessary.      Physician Signature:Eden, Betti Cruz, PA    _______________________________ Date _____________      Please sign and return to [facility].  Please fax to the location listed above. THANK YOU for this referral!

## 2021-06-19 ENCOUNTER — Inpatient Hospital Stay
Admit: 2021-06-19 | Discharge: 2021-06-19 | Disposition: A | Discharge status: Other Acute Inpatient Hospital | Payer: PRIVATE HEALTH INSURANCE | Attending: Emergency Medicine

## 2021-06-19 DIAGNOSIS — M79605 Pain in left leg: Secondary | ICD-10-CM

## 2021-06-19 MED ORDER — OXYCODONE-ACETAMINOPHEN 5-325 MG PO TABS
5-325 MG | Freq: Once | ORAL | Status: AC
Start: 2021-06-19 — End: 2021-06-19
  Administered 2021-06-19: 17:00:00 1 via ORAL

## 2021-06-19 MED FILL — OXYCODONE-ACETAMINOPHEN 5-325 MG PO TABS: 5-325 MG | ORAL | Qty: 1

## 2021-06-19 NOTE — ED Provider Notes (Signed)
[facility] EMERGENCY DEPT  EMERGENCY DEPARTMENT ENCOUNTER      Pt Name: Ronald Herrera  MRN: [id number]  Birthdate May 30, 1970  Date of evaluation: 06/19/2021  Provider: Juventino Slovak, MD    CHIEF COMPLAINT       Chief Complaint   Patient presents with    Foot Pain     Patient C/C of foot/ankle pain.  Per patient he had surgery x 6 weeks ago on his ankle.  Pt states he took a long car ride a day ago and his foot and lower leg swelled up greatly.         HISTORY OF PRESENT ILLNESS    HPI  Well-appearing 51 year old male presents to the emergency department complaining of some pain and swelling to his left ankle.  The patient had ankle surgery revision in March and has been doing relatively well.  He was seen by the orthopedic PA last week and seen by physical therapy earlier this week.  He just received clearance for touchdown weightbearing.  He began having some increasing swelling along the lateral aspect of his ankle just inferior to the well-healed suture line.  He also has some mild redness associated with the lower portion of his leg and ankle.  He denies any fever.  He denies any known specific injury.  He denies any chest pain or shortness of breath.    Nursing Notes were reviewed.    REVIEW OF SYSTEMS     Review of Systems   ROS per HPI    PAST MEDICAL, SOCIAL, FAMILY HISTORY     Past Medical History:   Diagnosis Date    Anxiety     Arthritis     LEFT ANKLE    BMI 32.0-32.9,adult     COVID 2022    Depression     Fibromyalgia     Generalized headache     GERD (gastroesophageal reflux disease)     Herpes     Hiatal hernia     Hyperlipidemia     Hypertension     Left ankle instability     Seasonal allergies     Unstable ankle, left     Wears glasses      Reviewed in medical record      ALLERGIES     Bee pollen, Grass pollen(k-o-r-t-swt vern), Pollen extract, Bromfenac, and Nsaids    PHYSICAL EXAM    (up to 7 for level 4, 8 or more for level 5)     Physical Exam  Vitals and nursing note reviewed.   Constitutional:        Appearance: Normal appearance.   HENT:      Head: Normocephalic.   Eyes:      Conjunctiva/sclera: Conjunctivae normal.   Pulmonary:      Effort: Pulmonary effort is normal. No respiratory distress.   Musculoskeletal:         General: No deformity.      Cervical back: Neck supple.      Comments: On examination of the left lower extremity both DP and PT are palpable and normal.  There is some swelling noted just posterior to the lateral malleolus to a well-healed suture line.  The suture line does not show any evidence of dehiscence or complication.  At the ankle and somewhat into the lower portion of the leg there is very faint erythema.  There is not appear to be any significant swelling in the calf.   Skin:     General:  Skin is warm and dry.   Neurological:      General: No focal deficit present.      Mental Status: He is alert and oriented to person, place, and time.   Psychiatric:         Attention and Perception: Attention normal.         Mood and Affect: Mood normal.         Behavior: Behavior normal.         EMERGENCY DEPARTMENT COURSE/REASSESSMENT and MDM:   Vitals:  Triage VS reviewed    Impression:   The patient with HPI as documented above. Presented to a freestanding emergency department.  He did not have vascular study capability here.  At a minimum the patient will require vascular study as well as some additional testing.  I discussed this with the patient and his wife who is at the bedside in regards to the utility of starting his work-up here and subsequently being transferred versus proceeding to one of our sister hospitals immediately.  They prefer to go to their so that the entire work-up can be completed at one location as well as having him at a location where he could potentially be admitted if that is warranted.    ED Course:     I discussed the patient with Dr. Lovell Sheehan who is working currently at [facility] in the emergency department.  The patient has not taken any of his  pain medication today so we will give him a Percocet prior to transfer.  Otherwise he is instructed to proceed directly to the other hospital emergency department for further evaluation and treatment.      PROCEDURES:  Unless otherwise noted below, none     Procedures    FINAL IMPRESSION      1. Pain of left lower extremity            DISPOSITION/PLAN   DISPOSITION Decision To Transfer 06/19/2021 01:23:27 PM      PATIENT REFERRED TO:  No follow-up provider specified.    DISCHARGE MEDICATIONS:  New Prescriptions    No medications on file     Controlled Substances Monitoring:     No flowsheet data found.    (Please note that portions of this note were completed with a voice recognition program.  Efforts were made to edit the dictations but occasionally words are mis-transcribed.)    Juventino Slovak, MD (electronically signed)  Attending Emergency Physician           Juventino Slovak, MD  06/19/21 1325

## 2021-06-19 NOTE — ED Triage Notes (Signed)
Patient is ambulatory and appears in no distress.

## 2021-06-20 ENCOUNTER — Inpatient Hospital Stay
Admit: 2021-06-20 | Discharge: 2021-06-22 | Disposition: A | Discharge status: Home / Self Care | Payer: PRIVATE HEALTH INSURANCE | Source: Other Acute Inpatient Hospital | Attending: Internal Medicine | Admitting: Internal Medicine

## 2021-06-20 ENCOUNTER — Emergency Department: Admit: 2021-06-20 | Payer: PRIVATE HEALTH INSURANCE

## 2021-06-20 ENCOUNTER — Inpatient Hospital Stay: Admit: 2021-06-20 | Payer: PRIVATE HEALTH INSURANCE

## 2021-06-20 ENCOUNTER — Inpatient Hospital Stay
Admit: 2021-06-20 | Discharge: 2021-06-20 | Disposition: A | Discharge status: Home / Self Care | Payer: PRIVATE HEALTH INSURANCE | Attending: Emergency Medicine

## 2021-06-20 DIAGNOSIS — I2699 Other pulmonary embolism without acute cor pulmonale: Secondary | ICD-10-CM

## 2021-06-20 DIAGNOSIS — I2692 Saddle embolus of pulmonary artery without acute cor pulmonale: Secondary | ICD-10-CM

## 2021-06-20 LAB — ECHO (TTE) COMPLETE (PRN CONTRAST/BUBBLE/STRAIN/3D)
Ao Root Index: 1.64 cm/m2
Aortic Root: 3.4 cm
Body Surface Area: 2.13 m2
E/E' Lateral: 4.44
E/E' Ratio (Averaged): 5.08
E/E' Septal: 5.71
Est. RA Pressure: 3 mmHg
IVC Proxmal: 1.7 cm
IVSd: 1.1 cm — AB (ref 0.6–1.0)
LA Diameter: 3.2 cm
LA Minor Axis: 3.2 cm
LA Size Index: 1.55 cm/m2
LA/AO Root Ratio: 0.94
LV E' Lateral Velocity: 9 cm/s
LV E' Septal Velocity: 7 cm/s
LV EDV A4C: 65 mL
LV EDV Index A4C: 31 mL/m2
LV ESV A4C: 18 mL
LV ESV Index A4C: 9 mL/m2
LV Ejection Fraction A4C: 72 %
LV Mass 2D Index: 66.7 g/m2 (ref 49–115)
LV Mass 2D: 138.2 g (ref 88–224)
LV RWT Ratio: 0.65
LVIDd Index: 1.79 cm/m2
LVIDd: 3.7 cm — AB (ref 4.2–5.9)
LVOT Area: 3.1 cm2
LVOT Diameter: 2 cm
LVOT Mean Gradient: 1 mmHg
LVOT Peak Gradient: 3 mmHg
LVOT Peak Velocity: 0.8 m/s
LVOT SV: 45.5 ml
LVOT Stroke Volume Index: 22 mL/m2
LVOT VTI: 14.5 cm
LVPWd: 1.2 cm — AB (ref 0.6–1.0)
MV A Velocity: 0.6 m/s
MV E Velocity: 0.4 m/s
MV E Wave Deceleration Time: 60 ms
MV E/A: 0.67
MV Mean Gradient: 1 mmHg
MV Peak Gradient: 1 mmHg
RV Free Wall Peak S': 11 cm/s
RVIDd: 2.7 cm
RVSP: 39 mmHg
TAPSE: 1.8 cm (ref 1.7–?)
TR Max Velocity: 3 m/s
TR Peak Gradient: 37 mmHg

## 2021-06-20 LAB — BASIC METABOLIC PANEL
Anion Gap: 12 mmol/L (ref 2–17)
BUN: 17 mg/dL (ref 6–20)
CO2: 29 mmol/L (ref 22–29)
Calcium: 9.5 mg/dL (ref 8.6–10.0)
Chloride: 101 mmol/L (ref 98–107)
Creatinine: 1.3 mg/dL (ref 0.7–1.3)
Est, Glom Filt Rate: 67 mL/min/1.73m?? (ref >=60)
Glucose: 112 mg/dL — ABNORMAL HIGH (ref 70–99)
Osmolaliy Calculated: 284 mosm/kg (ref 270–287)
Potassium: 3.6 mmol/L (ref 3.5–5.3)
Sodium: 141 mmol/L (ref 135–145)

## 2021-06-20 LAB — CBC WITH AUTO DIFFERENTIAL
Basophils %: 0.3 % (ref 0.0–2.0)
Basophils Absolute: 0 10*3/uL (ref 0.0–0.2)
Eosinophils %: 1.5 % (ref 0.0–7.0)
Eosinophils Absolute: 0.1 10*3/uL (ref 0.0–0.5)
Hematocrit: 46.5 % (ref 38.0–52.0)
Hemoglobin: 14.4 g/dL (ref 13.0–17.3)
Immature Grans (Abs): 0.01 10*3/uL (ref 0.00–0.06)
Immature Granulocytes %: 0.1 % (ref 0.0–0.6)
Lymphocytes Absolute: 2 10*3/uL (ref 1.0–3.2)
Lymphocytes: 29.9 % (ref 15.0–45.0)
MCH: 26.1 pg — ABNORMAL LOW (ref 27.0–34.5)
MCHC: 31 g/dL — ABNORMAL LOW (ref 32.0–36.0)
MCV: 84.4 fL (ref 84.0–100.0)
MPV: 10.2 fL (ref 7.2–13.2)
Monocytes %: 7.9 % (ref 4.0–12.0)
Monocytes Absolute: 0.5 10*3/uL (ref 0.3–1.0)
Neutrophils %: 60.3 % (ref 42.0–74.0)
Neutrophils Absolute: 4 10*3/uL (ref 1.6–7.3)
Platelets: 179 10*3/uL (ref 140–440)
RBC: 5.51 x10e6/mcL (ref 4.00–5.60)
RDW: 14.4 % (ref 11.0–16.0)
WBC: 6.7 10*3/uL (ref 3.8–10.6)

## 2021-06-20 LAB — CBC
Hematocrit: 45.2 % (ref 38.0–52.0)
Hemoglobin: 14 g/dL (ref 13.0–17.3)
MCH: 26.3 pg — ABNORMAL LOW (ref 27.0–34.5)
MCHC: 31 g/dL — ABNORMAL LOW (ref 32.0–36.0)
MCV: 84.8 fL (ref 84.0–100.0)
MPV: 10.8 fL (ref 7.2–13.2)
Platelets: 163 10*3/uL (ref 140–440)
RBC: 5.33 x10e6/mcL (ref 4.00–5.60)
RDW: 14.5 % (ref 11.0–16.0)
WBC: 6.3 10*3/uL (ref 3.8–10.6)

## 2021-06-20 LAB — APTT
APTT: 24.4 s (ref 23.3–34.5)
APTT: 92.2 s — ABNORMAL HIGH (ref 23.3–34.5)

## 2021-06-20 LAB — CULTURE, MRSA, SCREENING

## 2021-06-20 LAB — D-DIMER, QUANTITATIVE: D-Dimer, Quant: 11.33 CD:295178345 — ABNORMAL HIGH (ref 0.19–0.50)

## 2021-06-20 MED ORDER — SODIUM CHLORIDE 0.9 % IV SOLN
0.9 % | INTRAVENOUS | Status: AC | PRN
Start: 2021-06-20 — End: 2021-06-22

## 2021-06-20 MED ORDER — HEPARIN SOD (PORCINE) IN D5W 100 UNIT/ML IV SOLN
100 UNIT/ML | INTRAVENOUS | Status: DC
Start: 2021-06-20 — End: 2021-06-20
  Administered 2021-06-20: 17:00:00 18 [IU]/kg/h via INTRAVENOUS

## 2021-06-20 MED ORDER — ACETAMINOPHEN 650 MG RE SUPP
650 | Freq: Four times a day (QID) | RECTAL | Status: DC | PRN
Start: 2021-06-20 — End: 2021-06-22

## 2021-06-20 MED ORDER — POLYETHYLENE GLYCOL 3350 17 G PO PACK
17 g | Freq: Every day | ORAL | Status: AC | PRN
Start: 2021-06-20 — End: 2021-06-22

## 2021-06-20 MED ORDER — ONDANSETRON 4 MG PO TBDP
4 MG | Freq: Three times a day (TID) | ORAL | Status: AC | PRN
Start: 2021-06-20 — End: 2021-06-22

## 2021-06-20 MED ORDER — HEPARIN SOD (PORCINE) IN D5W 100 UNIT/ML IV SOLN
100 UNIT/ML | INTRAVENOUS | Status: AC
Start: 2021-06-20 — End: 2021-06-21
  Administered 2021-06-20: 21:00:00 18 [IU]/kg/h via INTRAVENOUS

## 2021-06-20 MED ORDER — HEPARIN SODIUM (PORCINE) 1000 UNIT/ML IJ SOLN
1000 UNIT/ML | INTRAMUSCULAR | Status: AC | PRN
Start: 2021-06-20 — End: 2021-06-21

## 2021-06-20 MED ORDER — POTASSIUM CHLORIDE 10 MEQ/100ML IV SOLN
10 MEQ/0ML | INTRAVENOUS | Status: AC | PRN
Start: 2021-06-20 — End: 2021-06-22

## 2021-06-20 MED ORDER — TRAZODONE HCL 50 MG PO TABS
50 MG | Freq: Every evening | ORAL | Status: AC | PRN
Start: 2021-06-20 — End: 2021-06-22
  Administered 2021-06-21: 05:00:00 100 mg via ORAL

## 2021-06-20 MED ORDER — IOPAMIDOL 76 % IV SOLN
76 % | Freq: Once | INTRAVENOUS | Status: AC | PRN
Start: 2021-06-20 — End: 2021-06-20
  Administered 2021-06-20: 16:00:00 100 mL via INTRAVENOUS

## 2021-06-20 MED ORDER — ACETAMINOPHEN 325 MG PO TABS
325 | Freq: Four times a day (QID) | ORAL | Status: DC | PRN
Start: 2021-06-20 — End: 2021-06-22
  Administered 2021-06-20 – 2021-06-22 (×4): 650 mg via ORAL

## 2021-06-20 MED ORDER — PANTOPRAZOLE SODIUM 40 MG PO TBEC
40 MG | Freq: Every day | ORAL | Status: AC
Start: 2021-06-20 — End: 2021-06-22
  Administered 2021-06-21 – 2021-06-22 (×2): 40 mg via ORAL

## 2021-06-20 MED ORDER — HEPARIN SODIUM (PORCINE) 1000 UNIT/ML IJ SOLN
1000 UNIT/ML | Freq: Once | INTRAMUSCULAR | Status: AC
Start: 2021-06-20 — End: 2021-06-20
  Administered 2021-06-20: 17:00:00 7400 [IU]/kg via INTRAVENOUS

## 2021-06-20 MED ORDER — OXYCODONE HCL 5 MG PO TABS
5 | Freq: Three times a day (TID) | ORAL | Status: DC | PRN
Start: 2021-06-20 — End: 2021-06-21
  Administered 2021-06-20 – 2021-06-21 (×2): 5 mg via ORAL

## 2021-06-20 MED ORDER — SODIUM PHOSPHATE 3 MMOL/ML IV SOLN (MIXTURES ONLY)
3 mmol/mL | INTRAVENOUS | Status: AC | PRN
Start: 2021-06-20 — End: 2021-06-22

## 2021-06-20 MED ORDER — HEPARIN SODIUM (PORCINE) 1000 UNIT/ML IJ SOLN
1000 UNIT/ML | INTRAMUSCULAR | Status: DC | PRN
Start: 2021-06-20 — End: 2021-06-20

## 2021-06-20 MED ORDER — MAGNESIUM SULFATE 2 GM/50ML IV SOLN
2 GM/50ML | INTRAVENOUS | Status: AC | PRN
Start: 2021-06-20 — End: 2021-06-22

## 2021-06-20 MED ORDER — HEPARIN SODIUM (PORCINE) 1000 UNIT/ML IJ SOLN
1000 UNIT/ML | Freq: Once | INTRAMUSCULAR | Status: AC
Start: 2021-06-20 — End: 2021-06-22

## 2021-06-20 MED ORDER — POTASSIUM CHLORIDE 20 MEQ/50ML IV SOLN
20 MEQ/50ML | INTRAVENOUS | Status: AC | PRN
Start: 2021-06-20 — End: 2021-06-22

## 2021-06-20 MED ORDER — NORMAL SALINE FLUSH 0.9 % IV SOLN
0.9 % | Freq: Two times a day (BID) | INTRAVENOUS | Status: DC
Start: 2021-06-20 — End: 2021-06-22
  Administered 2021-06-20: 10 mL via INTRAVENOUS
  Administered 2021-06-22: 01:00:00 20 mL via INTRAVENOUS
  Administered 2021-06-22: 14:00:00 10 mL via INTRAVENOUS

## 2021-06-20 MED ORDER — ONDANSETRON HCL 4 MG/2ML IJ SOLN
42 MG/2ML | Freq: Four times a day (QID) | INTRAMUSCULAR | Status: DC | PRN
Start: 2021-06-20 — End: 2021-06-22

## 2021-06-20 MED ORDER — PREGABALIN 75 MG PO CAPS
75 MG | Freq: Two times a day (BID) | ORAL | Status: AC | PRN
Start: 2021-06-20 — End: 2021-06-21
  Administered 2021-06-21: 16:00:00 75 mg via ORAL

## 2021-06-20 MED ORDER — NORMAL SALINE FLUSH 0.9 % IV SOLN
0.9 % | INTRAVENOUS | Status: AC | PRN
Start: 2021-06-20 — End: 2021-06-22

## 2021-06-20 MED FILL — BD POSIFLUSH 0.9 % IV SOLN: 0.9 % | INTRAVENOUS | Qty: 40

## 2021-06-20 MED FILL — HEPARIN SOD (PORCINE) IN D5W 100 UNIT/ML IV SOLN: 100 UNIT/ML | INTRAVENOUS | Qty: 250

## 2021-06-20 MED FILL — OXYCODONE HCL 5 MG PO TABS: 5 MG | ORAL | Qty: 1

## 2021-06-20 MED FILL — HEPARIN SODIUM (PORCINE) 1000 UNIT/ML IJ SOLN: 1000 UNIT/ML | INTRAMUSCULAR | Qty: 10

## 2021-06-20 MED FILL — HEPARIN SODIUM (PORCINE) 1000 UNIT/ML IJ SOLN: 1000 UNIT/ML | INTRAMUSCULAR | Qty: 7.4

## 2021-06-20 MED FILL — TYLENOL 325 MG PO TABS: 325 MG | ORAL | Qty: 2

## 2021-06-20 NOTE — H&P (Signed)
HISTORY AND PHYSICAL             Date: 06/20/2021        Patient Name: Ronald Herrera     Date of Birth: Nov 25, 1970      Age:  51 y.o.    Chief Complaint     Chief Complaint   Patient presents with    Surgery     Surgery with Dr Randa Lynn, March 16th. Ankle fusion left side. Swelling. Pain still a 10 out of 10. Saw Dr Mayford Knife yesterday.  Skin is slightly red and hurts to touch. Some SOB            History Obtained From   patient, electronic medical record    History of Present Illness   51 year old male with history of hypertension, hyperlipidemia GERD.  Underwent ankle surgery in March, has been limited by pain, initially 6 weeks of nonweightbearing.  He was evaluated in the [facility] emergency department 5/5 with left leg edema and pain.  Secondary to a recent long car ride and his recent orthopedic surgery, there was concern for the possibility of blood clot.  Patient was instructed directly to the [place] emergency department where he could undergo ultrasound as well as CT angiogram, however due to family issues he was unable to present yesterday.  Patient is evaluated today in the [facility] ER where he undergoes an ultrasound which shows a large left lower extremity DVT.  His D-dimer was elevated above 11.  Underwent CT angiogram showing pulmonary embolism without right heart strain.  He receives IV heparin, started on a heparin drip and is being transferred to the [facility] ICU for possible thrombectomy.  Has remained stable hemodynamically, blood pressure 130/100 on presentation with a pulse of 72.  Normal oxygen saturations on room air.  On arrival to [place], he is comfortable.  Asking for a diet.  His blood pressure remained stable 129/88, 100% oxygen saturations on room air.  Pulse is slightly elevated at 103.  Denies any history of any blood clots in his legs or in his lungs.  He does admit to a long car ride earlier this week.  He does state that significant shortness of breath with acute onset yesterday  afternoon while in the ER.    Past Medical History     Past Medical History:   Diagnosis Date    Anxiety     Arthritis     LEFT ANKLE    BMI 32.0-32.9,adult     COVID 2022    Depression     Fibromyalgia     Generalized headache     GERD (gastroesophageal reflux disease)     Herpes     Hiatal hernia     Hyperlipidemia     Hypertension     Left ankle instability     Seasonal allergies     Unstable ankle, left     Wears glasses    Neuropathy left lower extremity    Past Surgical History     Past Surgical History:   Procedure Laterality Date    ANKLE SURGERY Left 06/28/2019    left ankle partial resection tibia, partial resection fibula, brostrom gould lateral ankle ligament reconstruction, left ankle mosaicplasty with aosteochondral plug transfer from ipsilateral knee-dr. blake ohlson    ANKLE SURGERY Left 04/30/2021    LEFT TIBIOTALAR ARTHRODESIS WITH ASPIRATION PROXIMAL TIBIA, + Left ankle arthroscopy performed by Baxter Hire, MD at [facility]    ENDOSCOPY, COLON, DIAGNOSTIC  04/09/2021    FOOT SURGERY Left     IP fusion 12/2017    TOE SURGERY  2019    Left foot [facility] [place]        Medications Prior to Admission     Prior to Admission medications    Medication Sig Start Date End Date Taking? Authorizing Provider   acyclovir (ZOVIRAX) 400 MG tablet Take 1 tablet by mouth 2 times daily 02/25/21  Yes Historical Provider, MD   acetaminophen (TYLENOL) 500 MG tablet Take 2 tablets by mouth in the morning and at bedtime 02/25/21  Yes Historical Provider, MD   atorvastatin (LIPITOR) 40 MG tablet Take 0.5 tablets by mouth daily 02/10/21  Yes Historical Provider, MD   cyproheptadine (PERIACTIN) 4 MG tablet Take 2 tablets by mouth once 05/15/21  Yes Historical Provider, MD   oxyCODONE (ROXICODONE) 5 MG immediate release tablet Take 1 tablet by mouth in the morning and at bedtime. Max Daily Amount: 10 mg 06/08/21  Yes Historical Provider, MD   aspirin 325 MG EC tablet Take 1 tablet by  mouth daily for 21 days 05/18/21 06/08/21  Audie Box, PA   naloxone [facility]) 4 MG/0.1ML LIQD nasal spray 1 spray by Nasal route as needed for Opioid Reversal 05/18/21   Audie Box, PA   pregabalin (LYRICA) 75 MG capsule Take 1 capsule by mouth 2 times daily for 30 days. Max Daily Amount: 150 mg 05/12/21 06/11/21  Audie Box, PA   ondansetron [facility]) 4 MG tablet Take 1 tablet by mouth 3 times daily as needed for Nausea or Vomiting 04/28/21   Diana Eves, PA   aspirin 325 MG EC tablet Take 1 tablet by mouth daily for 14 days Take daily in the morning with food to prevent blood clot formation. 04/28/21 05/12/21  Diana Eves, PA   gabapentin (NEURONTIN) 300 MG capsule Take 2 capsules by mouth 3 times daily. 01/20/21   Historical Provider, MD   pantoprazole (PROTONIX) 40 MG tablet Take 1 tablet by mouth every morning 09/18/20   Historical Provider, MD   sildenafil (VIAGRA) 100 MG tablet Take 1 tablet by mouth as needed for Erectile Dysfunction 10/02/20 10/03/21  Historical Provider, MD   sucralfate (CARAFATE) 1 GM tablet Take 1 tablet by mouth in the morning and at bedtime 11/11/20   Historical Provider, MD   tadalafil (CIALIS) 20 MG tablet Take 1 tablet by mouth as needed for Erectile Dysfunction 01/20/21   Historical Provider, MD   LUBRICATING PLUS EYE DROPS 0.5 % SOLN ophthalmic solution  09/11/20   Historical Provider, MD   cetirizine (ZYRTEC) 10 MG tablet Take 1 tablet by mouth every morning 06/10/20   Historical Provider, MD   clotrimazole (LOTRIMIN) 1 % cream Apply topically at bedtime 09/08/20   Historical Provider, MD   DULoxetine (CYMBALTA) 60 MG extended release capsule Take 90 mg by mouth every morning 02/15/20 04/30/21  Historical Provider, MD   ergocalciferol (ERGOCALCIFEROL) 1.25 MG (50000 UT) capsule Take 50,000 Units by mouth once a week MD ADVISED.  MONDAY 03/04/20 04/30/21  Historical Provider, MD   hydroCHLOROthiazide (HYDRODIURIL) 25 MG tablet Take 1 tablet by mouth every morning 09/08/20   Historical  Provider, MD   losartan (COZAAR) 100 MG tablet TAKE ONE-HALF TABLET BY MOUTH EVERY DAY 11/23/19 04/30/21  Historical Provider, MD   losartan (COZAAR) 100 MG tablet Take 1 tablet by mouth every morning 09/08/20   Historical Provider, MD  olopatadine (PATANOL) 0.1 % ophthalmic solution Place 1 drop into both eyes as needed 09/08/20   Historical Provider, MD   traZODone (DESYREL) 100 MG tablet Take 1 tablet by mouth nightly 06/10/20 07/03/21  Historical Provider, MD        Allergies   Bee pollen, Grass pollen([id number] vern), Pollen extract, Bromfenac, and Nsaids    Social History     Social History       Tobacco History       Smoking Status  Former Smoking Start Date  02/16/1991 Quit Date  12/02/2020 Smoking Frequency  1 pack/day for 15.00 years (15.00 pk-yrs)    Smoking Tobacco Type  Cigarettes from 02/16/1991 to 12/02/2020      Smokeless Tobacco Use  Never              Alcohol History       Alcohol Use Status  Yes Drinks/Week  0 Glasses of wine, 4 Cans of beer, 0 Shots of liquor, 0 Drinks containing 0.5 oz of alcohol per week Amount  4.0 standard drinks of alcohol/wk Comment  15 DRINKS PER WEEK              Drug Use       Drug Use Status  Yes Types  Marijuana (Weed) Frequency   3 times/week              Sexual Activity       Sexually Active  Yes Partners  Male                No longer smoking.  Married.  Currently unemployed.  Multiple children.    Family History     Family History   Problem Relation Age of Onset    Diabetes Mother        Review of Systems   Patient denies any past MI, CVA, DVT or pulmonary embolism.  CT angiogram does show him to have coronary calcifications to his left anterior descending.  He denies any chronic problems with his bowels or bladder.  He has been limited with his mobility for the last 2 months secondary to his ankle surgery, but overall has had troubles with his ankle since undergoing his original surgery in 2021.    Physical Exam   BP (!) 130/100   Pulse 72   Temp 98.5 F (36.9 C)  (Oral)   Resp 18   Ht  (1.702 m)   Wt 204 lb (92.5 kg)   SpO2 98%   BMI 31.95 kg/m     General: Awake and alert. Answers all questions.  Eye: Conjunctiva clear  HENT: Moist oral mucosa.  Neck: Trachea midline. No cervical adenopathy.  Lungs: Equal breath sounds with no wheeze  Heart: Regular rhythm and rate  Abdomen: Soft and flat, no distention.  Musculoskeletal: Left lower extremity edema, tenderness.  Healed surgical incision left ankle, left ankle tenderness. Strong dorsalis pedis pulses  Skin: Skin is warm, dry   Neurologic: Awake, alert, and oriented X3. Moves hands and feet symmetrically.  Psychiatric: Pleasant      Labs      Recent Results (from the past 24 hour(s))   Basic Metabolic Panel    Collection Time: 06/20/21 11:04 AM   Result Value Ref Range    Sodium 141 135 - 145 mmol/L    Potassium 3.6 3.5 - 5.3 mmol/L    Chloride 101 98 - 107 mmol/L    CO2 29 22 - 29 mmol/L  Glucose 112 (H) 70 - 99 mg/dL    BUN 17 6 - 20 mg/dL    Creatinine 1.3 0.7 - 1.3 mg/dL    Anion Gap 12 2 - 17 mmol/L    OSMOLALITY CALCULATED 284 270 - 287 mOsm/kg    Calcium 9.5 8.6 - 10.0 mg/dL    Est, Glom Filt Rate 67 >=60 mL/min/1.23m   CBC with Auto Differential    Collection Time: 06/20/21 11:04 AM   Result Value Ref Range    WBC 6.7 3.8 - 10.6 x10e3/mcL    RBC 5.51 4.00 - 5.60 x10e6/mcL    Hemoglobin 14.4 13.0 - 17.3 g/dL    Hematocrit 16.1 09.6 - 52.0 %    MCV 84.4 84.0 - 100.0 fL    MCH 26.1 (L) 27.0 - 34.5 pg    MCHC 31.0 (L) 32.0 - 36.0 g/dL    RDW 04.5 40.9 - 81.1 %    Platelets 179 140 - 440 x10e3/mcL    MPV 10.2 7.2 - 13.2 fL    Neutrophils % 60.3 42.0 - 74.0 %    Lymphocytes 29.9 15.0 - 45.0 %    Monocytes 7.9 4.0 - 12.0 %    Eosinophils % 1.5 0.0 - 7.0 %    Basophils % 0.3 0.0 - 2.0 %    Neutrophils Absolute 4.0 1.6 - 7.3 x10e3/mcL    Absolute Lymph # 2.0 1.0 - 3.2 x10e3/mcL    Absolute Mono # 0.5 0.3 - 1.0 x10e3/mcL    Absolute Eos # 0.1 0.0 - 0.5 x10e3/mcL    Absolute Baso # 0.0 0.0 - 0.2 x10e3/mcL     Immature Granulocytes 0.1 0.0 - 0.6 %    Immature Grans (Abs) 0.01 0.00 - 0.06 x10e3/mcL   D-Dimer, Quantitative    Collection Time: 06/20/21 11:04 AM   Result Value Ref Range    D-Dimer, Quant 11.33 (H) 0.19 - 0.50 [id number]   CBC    Collection Time: 06/20/21 12:33 PM   Result Value Ref Range    WBC 6.3 3.8 - 10.6 x10e3/mcL    RBC 5.33 4.00 - 5.60 x10e6/mcL    Hemoglobin 14.0 13.0 - 17.3 g/dL    Hematocrit 30.8 65.7 - 52.0 %    MCV 84.8 84.0 - 100.0 fL    MCH 26.3 (L) 27.0 - 34.5 pg    MCHC 31.0 (L) 32.0 - 36.0 g/dL    RDW 84.6 96.2 - 95.2 %    Platelets 163 140 - 440 x10e3/mcL    MPV 10.8 7.2 - 13.2 fL   APTT    Collection Time: 06/20/21 12:33 PM   Result Value Ref Range    PTT 24.4 23.3 - 34.5 seconds        Imaging/Diagnostics Last 24 Hours   CTA Chest W/WO Contrast PE Eval    Result Date: 06/20/2021  CTA chest: 06/20/21 INDICATION: "PE". Shortness of breath. History of recent surgery. COMPARISON: None TECHNIQUE: PE protocol (Imaging from the thoracic inlet through the hemidiaphragms following contrast in the pulmonary arterial phase. Axial soft tissue and lung 5 x 5 mm images. Additional multiplanar 3-D volumetric MIP reconstructions through the pulmonary arteries were generated and reviewed per protocol.) CT scanning was performed using the radiation dose reduction technique when appropriate, per system protocols. CHEST: Thyroid is unremarkable. No evidence of supraclavicular or axillary lymphadenopathy. No pericardial effusion. No mediastinal or hilar lymphadenopathy. Coronary artery calcifications, particularly involving thead. No lobar consolidation. Probable atelectasis in the lung bases, right greater than  left. There is a 7 x 9 mm pulmonary nodule within the right middle lobe, best appreciated on series 6 image 69 and series 9 image 72. Minimal surrounding  groundglass opacity. Findings may be infectious or inflammatory. Short interval  follow-up CT the chest in 3 months is recommended. Further  evaluation with PET CT could also be considered. No pleural effusion or pneumothorax. Acute pulmonary embolus involving the main right and left pulmonary arteries with saddle embolus noted. Extensive thrombus extends into the bilateral lower lobar pulmonary arteries and extends into the segmental and subsegmental branches. Thrombus extends into the right middle lobar as well as the bilateral upper lobar pulmonary arteries. The RV to LV ratio is 1.1 Visualized portions of the upper abdomen are grossly within normal limits. No acute osseous abnormality. No concerning intraosseous lesions.     1. Acute saddle pulmonary embolus involving the right and left main pulmonary arteries with extension into the lobar, segmental, and subsegmental branches as above. Clot burden is most pronounced in the lower lobes bilaterally. Evidence of right heart strain with RV to LV ratio 1.1. 2. Indeterminate 9 x 7 mm pulmonary nodule in the right middle lobe. Minimal surrounding groundglass opacity. Findings may be infectious or inflammatory but this is not definitive. No additional nodules. Short interval follow-up CT the chest in 3 months is recommended. Follow-up routine PET CT could also be considered. 3. Coronary artery calcifications in the LAD. Findings discussed with Dr. Zachery DauerBarnes at 1:45 PM 06/20/2021      Assessment        Plan   Pulmonary embolism  -[facility] after presenting with left ankle edema and tenderness, ultrasound showing femoral DVT with CT angiogram showing saddle pulmonary embolism without right heart strain.  Consult to cardiology for considerations for thrombectomy.  Patient has been started on IV heparin.  Pending echocardiogram.  Follow BNP, troponins.    DVT  -Left lower extremity DVT as above.    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  Weightbearing as tolerated.  Pain issues, continue with as needed oxycodone.    Insomnia  -Requests trazodone at  night    Coronary calcifications  -Angiogram showing left anterior descending calcifications.  Baseline EKG.  Patient instructed to follow-up with cardiology as an outpatient.    Nutrition: Diet as tolerated  Routine ICU care    Consultations Ordered:  Cardiology  Electronically signed by Judee ClaraGregory Ross Jacob Cicero, PA-C on 06/20/21 at 2:05 PM EDT

## 2021-06-20 NOTE — H&P (Signed)
Brief history and physical    51 year old male with a history of hypertension presents with left leg swelling and shortness of breath    The patient reports that he is a former smoker quit smoking about 4 months ago.  He reports having an old ankle injury many years and presented 16th for surgery with what he anticipated being an ankle fusion but ended up being more involved.  He reports that he did take aspirin after the surgery and was getting around on crutches and been seen by physical therapy once.  Because he had some lower extremity edema he was seen in the emergency department yesterday and they advise going for a lower extremity Doppler study which she did present to [facility] on the following day and was found to have a large left DVT and elevated d-dimer and a CT angiogram that showed a saddle pulmonary embolism.  He does acknowledge some shortness of breath with moving to the table but otherwise has not experienced shortness of breath but have been more physically limited.    Vitals:    06/20/21 1838   BP: 130/85   Pulse:    Resp:    SpO2:       He is breathing comfortably on room air  Exam is clear Artis now a tachycardic with heart rate of 105 no murmur abdomen soft left lower ankle does have a dressing and some mild edema    Laboratory and CAT scan reviewed    Assessment and plan    - acute pulmonary embolism with saddle emboli.  His RV and LV ratio is 1-1 he is resting comfortably on room air with stable vital signs.  TPA is not a consideration given his recent surgery and clot extraction is a consideration but because the stability of his vital signs we'll treat with heparin for 48 hrs and change to Eliquis  - this is considered a provoked pulmonary embolism and his duration of anticoagulation will be need to be 3-6 months with recheck  - plan to check echocardiogram  - hypertension plan to continue current medication  - Check BNP and troponin    Full admission H&P to follow    30 min cc  time

## 2021-06-20 NOTE — ED Notes (Signed)
Patient stated he weighed 204 lbs. Weighed patient on the scale and weight is accurate.      Renato Shin, RN  06/20/21 1459

## 2021-06-20 NOTE — ED Notes (Signed)
Called report to [facility] to nurse Nelida Gores, RN     Renato Shin, RN  06/20/21 [phone]

## 2021-06-20 NOTE — ED Provider Notes (Signed)
[facility] DE[facility]  EMERGENCY DEPARTMENT ENCOUNTER      Pt Name: Ronald Herrera  MRN: [id number]  Birthdate [birth year]  Date of evaluation: 06/20/2021  Provider: Achilles Dunk, MD    CHIEF COMPLAINT       Chief Complaint   Patient presents with    Surgery     Surgery with Dr Randa Lynn, March 16th. Ankle fusion left side. Swelling. Pain still a 10 out of 10. Saw Dr Mayford Knife yesterday.  Skin is slightly red and hurts to touch. Some SOB           HISTORY OF PRESENT ILLNESS    51 year old African-American male presents emergency room for evaluation of left lower extremity pain and swelling.  Patient had ankle fusion March 16 and states over the past couple days the left leg has been swelling with some red discoloration.  Patient also states that he has been short of breath.  Patient was seen yesterday at [facility] corner and was supposed to come to get an ultrasound yesterday however due to family issues he was unable to come yesterday so he came this morning.  Patient denies any fever chest pain nausea vomiting diarrhea abdominal pain or any other concerns.        Nursing Notes were reviewed.    REVIEW OF SYSTEMS     Review of Systems   Respiratory:  Positive for shortness of breath.    Cardiovascular:  Positive for leg swelling.   All other systems reviewed and are negative.    Except as noted above the remainder of the review of systems was reviewed and negative.     PAST MEDICAL HISTORY     Past Medical History:   Diagnosis Date    Anxiety     Arthritis     LEFT ANKLE    BMI 32.0-32.9,adult     COVID 2022    Depression     Fibromyalgia     Generalized headache     GERD (gastroesophageal reflux disease)     Herpes     Hiatal hernia     Hyperlipidemia     Hypertension     Left ankle instability     Seasonal allergies     Unstable ankle, left     Wears glasses        SURGICAL HISTORY       Past Surgical History:   Procedure Laterality Date    ANKLE SURGERY Left 06/28/2019    left ankle partial resection tibia, partial resection  fibula, brostrom gould lateral ankle ligament reconstruction, left ankle mosaicplasty with aosteochondral plug transfer from ipsilateral knee-dr. blake ohlson    ANKLE SURGERY Left 04/30/2021    LEFT TIBIOTALAR ARTHRODESIS WITH ASPIRATION PROXIMAL TIBIA, + Left ankle arthroscopy performed by Baxter Hire, MD at [facility]    ENDOSCOPY, COLON, DIAGNOSTIC  04/09/2021    FOOT SURGERY Left     IP fusion 12/2017    TOE SURGERY  2019    Left foot [facility] [place]       CURRENT MEDICATIONS       Previous Medications    ACETAMINOPHEN (TYLENOL) 500 MG TABLET    Take 2 tablets by mouth in the morning and at bedtime    ACYCLOVIR (ZOVIRAX) 400 MG TABLET    Take 1 tablet by mouth 2 times daily    ASPIRIN 325 MG EC TABLET    Take 1 tablet by mouth daily for 14  days Take daily in the morning with food to prevent blood clot formation.    ASPIRIN 325 MG EC TABLET    Take 1 tablet by mouth daily for 21 days    ATORVASTATIN (LIPITOR) 40 MG TABLET    Take 0.5 tablets by mouth daily    CETIRIZINE (ZYRTEC) 10 MG TABLET    Take 1 tablet by mouth every morning    CLOTRIMAZOLE (LOTRIMIN) 1 % CREAM    Apply topically at bedtime    CYPROHEPTADINE (PERIACTIN) 4 MG TABLET    Take 2 tablets by mouth once    DULOXETINE (CYMBALTA) 60 MG EXTENDED RELEASE CAPSULE    Take 90 mg by mouth every morning    ERGOCALCIFEROL (ERGOCALCIFEROL) 1.25 MG (50000 UT) CAPSULE    Take 50,000 Units by mouth once a week MD ADVISED.  MONDAY    GABAPENTIN (NEURONTIN) 300 MG CAPSULE    Take 2 capsules by mouth 3 times daily.    HYDROCHLOROTHIAZIDE (HYDRODIURIL) 25 MG TABLET    Take 1 tablet by mouth every morning    LOSARTAN (COZAAR) 100 MG TABLET    TAKE ONE-HALF TABLET BY MOUTH EVERY DAY    LOSARTAN (COZAAR) 100 MG TABLET    Take 1 tablet by mouth every morning    LUBRICATING PLUS EYE DROPS 0.5 % SOLN OPHTHALMIC SOLUTION        NALOXONE (NARCAN) 4 MG/0.1ML LIQD NASAL SPRAY    1 spray by Nasal route as needed for Opioid Reversal     OLOPATADINE (PATANOL) 0.1 % OPHTHALMIC SOLUTION    Place 1 drop into both eyes as needed    ONDANSETRON (ZOFRAN) 4 MG TABLET    Take 1 tablet by mouth 3 times daily as needed for Nausea or Vomiting    OXYCODONE (ROXICODONE) 5 MG IMMEDIATE RELEASE TABLET    Take 1 tablet by mouth in the morning and at bedtime. Max Daily Amount: 10 mg    PANTOPRAZOLE (PROTONIX) 40 MG TABLET    Take 1 tablet by mouth every morning    PREGABALIN (LYRICA) 75 MG CAPSULE    Take 1 capsule by mouth 2 times daily for 30 days. Max Daily Amount: 150 mg    SILDENAFIL (VIAGRA) 100 MG TABLET    Take 1 tablet by mouth as needed for Erectile Dysfunction    SUCRALFATE (CARAFATE) 1 GM TABLET    Take 1 tablet by mouth in the morning and at bedtime    TADALAFIL (CIALIS) 20 MG TABLET    Take 1 tablet by mouth as needed for Erectile Dysfunction    TRAZODONE (DESYREL) 100 MG TABLET    Take 1 tablet by mouth nightly       ALLERGIES     Bee pollen, Grass pollen([id number] vern), Pollen extract, Bromfenac, and Nsaids    FAMILY HISTORY       Family History   Problem Relation Age of Onset    Diabetes Mother         SOCIAL HISTORY       Social History     Socioeconomic History    Marital status: Married   Tobacco Use    Smoking status: Former     Packs/day: 1.00     Years: 15.00     Pack years: 15.00     Types: Cigarettes     Start date: 02/16/1991     Quit date: 12/02/2020     Years since quitting: 0.5    Smokeless tobacco: Never   Substance  and Sexual Activity    Alcohol use: Yes     Alcohol/week: 4.0 standard drinks     Types: 4 Cans of beer per week     Comment: 15 DRINKS PER WEEK    Drug use: Yes     Frequency: 3.0 times per week     Types: Marijuana Sheran Fava)    Sexual activity: Yes     Partners: Female   Social History Narrative    ** Merged History Encounter **            SCREENINGS         Glasgow Coma Scale  Eye Opening: Spontaneous  Best Verbal Response: Oriented  Best Motor Response: Obeys commands  Glasgow Coma Scale Score: 15                     CIWA  Assessment  BP: (!) 130/100  Pulse: 72                 PHYSICAL EXAM       ED Triage Vitals [06/20/21 1043]   BP Temp Temp Source Pulse Respirations SpO2 Height Weight   (!) 130/102 98.5 F (36.9 C) Oral 72 18 98 % 5\' 9"  (1.753 m) --       Physical Exam  Constitutional:       Appearance: Normal appearance. He is normal weight.   HENT:      Head: Normocephalic and atraumatic.      Nose: Nose normal.      Mouth/Throat:      Mouth: Mucous membranes are moist.      Pharynx: Oropharynx is clear.   Eyes:      Conjunctiva/sclera: Conjunctivae normal.      Pupils: Pupils are equal, round, and reactive to light.   Cardiovascular:      Rate and Rhythm: Normal rate and regular rhythm.      Pulses: Normal pulses.      Heart sounds: Normal heart sounds.   Pulmonary:      Effort: Pulmonary effort is normal.      Breath sounds: Normal breath sounds.   Abdominal:      General: Abdomen is flat. Bowel sounds are normal.   Musculoskeletal:      Cervical back: Normal range of motion and neck supple.      Comments: Left lower extremity soft tissue swelling and mild erythema.  Left leg is neurovascularly intact   Skin:     General: Skin is warm and dry.      Capillary Refill: Capillary refill takes less than 2 seconds.   Neurological:      General: No focal deficit present.      Mental Status: He is alert and oriented to person, place, and time.   Psychiatric:         Mood and Affect: Mood normal.         Behavior: Behavior normal.       Procedures    DIAGNOSTIC RESULTS     EKG: All EKG's are interpreted by the Emergency Department Physician who either signs or Co-signs this chart in the absence of a cardiologist.    RADIOLOGY:   CTA Chest W/WO Contrast PE Eval   Final Result      1. Acute saddle pulmonary embolus involving the right and left main pulmonary    arteries with extension into the lobar, segmental, and subsegmental branches as    above. Clot burden is most pronounced in  the lower lobes bilaterally. Evidence    of right heart  strain with RV to LV ratio 1.1.      2. Indeterminate 9 x 7 mm pulmonary nodule in the right middle lobe. Minimal    surrounding groundglass opacity. Findings may be infectious or inflammatory but    this is not definitive. No additional nodules. Short interval follow-up CT the    chest in 3 months is recommended. Follow-up routine PET CT could also be    considered.      3. Coronary artery calcifications in the LAD.      Findings discussed with Dr. Zachery DauerBarnes at 1:45 PM 06/20/2021      Vascular duplex lower extremity venous left    (Results Pending)       LABS:  Labs Reviewed   BASIC METABOLIC PANEL - Abnormal; Notable for the following components:       Result Value    Glucose 112 (*)     All other components within normal limits   CBC WITH AUTO DIFFERENTIAL - Abnormal; Notable for the following components:    MCH 26.1 (*)     MCHC 31.0 (*)     All other components within normal limits   D-DIMER, QUANTITATIVE - Abnormal; Notable for the following components:    D-Dimer, Quant 11.33 (*)     All other components within normal limits   CBC - Abnormal; Notable for the following components:    MCH 26.3 (*)     MCHC 31.0 (*)     All other components within normal limits   APTT   APTT   APTT       All other labs were within normal range or not returned as of this dictation.    EMERGENCY DEPARTMENT COURSE/REASSESSMENT and MDM:   MDM  Number of Diagnoses or Management Options  Acute deep vein thrombosis (DVT) of proximal vein of left lower extremity (HCC)  Acute saddle pulmonary embolism without acute cor pulmonale (HCC)  Diagnosis management comments: Patient's D-dimer is significantly elevated at 11 his ultrasound of his left leg demonstrates large clot burden.  Patient's vital signs are normal however he was complaining of shortness of breath with ambulation.  CT a of the chest demonstrates a saddle PE without RV strain.  His case was discussed with Dr. Clovis RileyMitchell [place] intensivist [place] who agreed to admit him to the ICU on  heparin drip.             FINAL IMPRESSION      1. Acute saddle pulmonary embolism without acute cor pulmonale (HCC)    2. Acute deep vein thrombosis (DVT) of proximal vein of left lower extremity [facility](HCC)          DISPOSITION/PLAN   DISPOSITION Decision To Transfer 06/20/2021 01:20:32 PM      PATIENT REFERRED TO:  No follow-up provider specified.    DISCHARGE MEDICATIONS:  New Prescriptions    No medications on file     Controlled Substances Monitoring:     No flowsheet data found.    (Please note that portions of this note were completed with a voice recognition program.  Efforts were made to edit the dictations but occasionally words are mis-transcribed.)    Achilles DunkFrank Hurst Militza Devery, MD (electronically signed)  Attending Emergency Physician           Achilles DunkFrank Hurst Kamori Kitchens, MD  06/20/21 1323

## 2021-06-21 ENCOUNTER — Inpatient Hospital Stay: Admit: 2021-06-21 | Payer: PRIVATE HEALTH INSURANCE

## 2021-06-21 LAB — APTT
APTT: 65.1 s — ABNORMAL HIGH (ref 23.3–34.5)
APTT: 109.4 s — ABNORMAL HIGH (ref 23.3–34.5)
APTT: 83.5 s — ABNORMAL HIGH (ref 23.3–34.5)

## 2021-06-21 LAB — HEPATIC FUNCTION PANEL
ALT: 67 U/L — ABNORMAL HIGH (ref 0–50)
AST: 52 U/L — ABNORMAL HIGH (ref 0–50)
Albumin: 3.8 g/dL (ref 3.5–5.2)
Alk Phosphatase: 120 U/L (ref 40–130)
Bilirubin, Direct: 0.3 mg/dL (ref 0.00–0.30)
Bilirubin, Indirect: 0.54 mg/dL (ref 0.00–1.00)
Total Bilirubin: 0.84 mg/dL (ref 0.00–1.20)
Total Protein: 6.9 g/dL (ref 6.4–8.3)

## 2021-06-21 LAB — BRAIN NATRIURETIC PEPTIDE
NT Pro-BNP: 100 pg/mL (ref 0–125)
NT Pro-BNP: 222 pg/mL — ABNORMAL HIGH (ref 0–125)

## 2021-06-21 LAB — BASIC METABOLIC PANEL
Anion Gap: 10 mmol/L (ref 2–17)
BUN: 15 mg/dL (ref 6–20)
CO2: 28 mmol/L (ref 22–29)
Calcium: 8.9 mg/dL (ref 8.6–10.0)
Chloride: 100 mmol/L (ref 98–107)
Creatinine: 1.2 mg/dL (ref 0.7–1.3)
Est, Glom Filt Rate: 74 mL/min/1.73m?? (ref >=60)
Glucose: 107 mg/dL — ABNORMAL HIGH (ref 70–99)
Osmolaliy Calculated: 277 mosm/kg (ref 270–287)
Potassium: 3.4 mmol/L — ABNORMAL LOW (ref 3.5–5.3)
Sodium: 138 mmol/L (ref 135–145)

## 2021-06-21 LAB — CBC
Hematocrit: 41.1 % (ref 38.0–52.0)
Hemoglobin: 12.7 g/dL — ABNORMAL LOW (ref 13.0–17.3)
MCH: 26.1 pg — ABNORMAL LOW (ref 27.0–34.5)
MCHC: 30.9 g/dL — ABNORMAL LOW (ref 32.0–36.0)
MCV: 84.6 fL (ref 84.0–100.0)
MPV: 11.7 fL (ref 7.2–13.2)
NRBC Absolute: 0 10*3/uL (ref 0.000–0.012)
NRBC Automated: 0 % (ref 0.0–0.2)
Platelets: 180 10*3/uL (ref 140–440)
RBC: 4.86 x10e6/mcL (ref 4.00–5.60)
RDW: 14.4 % (ref 11.0–16.0)
WBC: 7.6 10*3/uL (ref 3.8–10.6)

## 2021-06-21 LAB — CALCIUM, IONIZED: Calcium, Ionized: 4.3 mg/dL — ABNORMAL LOW (ref 4.5–5.3)

## 2021-06-21 LAB — TROPONIN
Troponin T: <0.01 ng/mL (ref 0.000–0.010)
Troponin T: <0.01 ng/mL (ref 0.000–0.010)

## 2021-06-21 LAB — MAGNESIUM: Magnesium: 2 mg/dL (ref 1.6–2.6)

## 2021-06-21 MED ORDER — OXYCODONE HCL 5 MG PO TABS
5 | Freq: Four times a day (QID) | ORAL | Status: DC | PRN
Start: 2021-06-21 — End: 2021-06-22
  Administered 2021-06-21 – 2021-06-22 (×3): 5 mg via ORAL

## 2021-06-21 MED ORDER — HEPARIN SOD (PORCINE) IN D5W 100 UNIT/ML IV SOLN
100 UNIT/ML | INTRAVENOUS | Status: AC
Start: 2021-06-21 — End: 2021-06-22
  Administered 2021-06-21: 20:00:00 19 [IU]/kg/h via INTRAVENOUS
  Administered 2021-06-21: 05:00:00 20 [IU]/kg/h via INTRAVENOUS
  Administered 2021-06-22: 11:00:00 19 [IU]/kg/h via INTRAVENOUS

## 2021-06-21 MED ORDER — IPRATROPIUM-ALBUTEROL 0.5-2.5 (3) MG/3ML IN SOLN
Freq: Once | RESPIRATORY_TRACT | Status: AC
Start: 2021-06-21 — End: 2021-06-21
  Administered 2021-06-21: 21:00:00 1 via RESPIRATORY_TRACT

## 2021-06-21 MED ORDER — GABAPENTIN 300 MG PO CAPS
300 MG | Freq: Three times a day (TID) | ORAL | Status: AC
Start: 2021-06-21 — End: 2021-06-22
  Administered 2021-06-21 – 2021-06-22 (×4): 600 mg via ORAL

## 2021-06-21 MED ORDER — CALCIUM GLUCONATE-NACL 1-0.675 GM/50ML-% IV SOLN
Freq: Once | INTRAVENOUS | Status: AC
Start: 2021-06-21 — End: 2021-06-21
  Administered 2021-06-21: 10:00:00 1000 mg via INTRAVENOUS

## 2021-06-21 MED ORDER — POTASSIUM CHLORIDE CRYS ER 20 MEQ PO TBCR
20 MEQ | Freq: Once | ORAL | Status: AC
Start: 2021-06-21 — End: 2021-06-21
  Administered 2021-06-21: 10:00:00 40 meq via ORAL

## 2021-06-21 MED ORDER — SUCRALFATE 1 G PO TABS
1 GM | Freq: Two times a day (BID) | ORAL | Status: AC
Start: 2021-06-21 — End: 2021-06-22
  Administered 2021-06-21 – 2021-06-22 (×3): 1 g via ORAL

## 2021-06-21 MED ORDER — HEPARIN SODIUM (PORCINE) 1000 UNIT/ML IJ SOLN
1000 UNIT/ML | INTRAMUSCULAR | Status: AC | PRN
Start: 2021-06-21 — End: 2021-06-22

## 2021-06-21 MED FILL — OXYCODONE HCL 5 MG PO TABS: 5 MG | ORAL | Qty: 1

## 2021-06-21 MED FILL — HEPARIN SOD (PORCINE) IN D5W 100 UNIT/ML IV SOLN: 100 UNIT/ML | INTRAVENOUS | Qty: 250

## 2021-06-21 MED FILL — TRAZODONE HCL 50 MG PO TABS: 50 MG | ORAL | Qty: 2

## 2021-06-21 MED FILL — GABAPENTIN 300 MG PO CAPS: 300 MG | ORAL | Qty: 2

## 2021-06-21 MED FILL — TYLENOL 325 MG PO TABS: 325 MG | ORAL | Qty: 2

## 2021-06-21 MED FILL — BD POSIFLUSH 0.9 % IV SOLN: 0.9 % | INTRAVENOUS | Qty: 40

## 2021-06-21 MED FILL — PANTOPRAZOLE SODIUM 40 MG PO TBEC: 40 MG | ORAL | Qty: 1

## 2021-06-21 MED FILL — KLOR-CON M20 20 MEQ PO TBCR: 20 MEQ | ORAL | Qty: 2

## 2021-06-21 MED FILL — CALCIUM GLUCONATE-NACL 1-0.675 GM/50ML-% IV SOLN: INTRAVENOUS | Qty: 50

## 2021-06-21 MED FILL — SUCRALFATE 1 G PO TABS: 1 GM | ORAL | Qty: 1

## 2021-06-21 MED FILL — BD POSIFLUSH 0.9 % IV SOLN: 0.9 % | INTRAVENOUS | Qty: 30

## 2021-06-21 MED FILL — IPRATROPIUM-ALBUTEROL 0.5-2.5 (3) MG/3ML IN SOLN: RESPIRATORY_TRACT | Qty: 3

## 2021-06-21 MED FILL — PREGABALIN 75 MG PO CAPS: 75 MG | ORAL | Qty: 1

## 2021-06-21 NOTE — Progress Notes (Addendum)
Hospitalist acceptance note    Subjective:   51 year old male with history of hypertension and recent ankle fusion surgery who presented with increased left lower extremity edema.  Work-up in ED also notable for saddle pulmonary emboli.  He was admitted to the ICU service heparin drip.  Remained hemodynamically stable.  Echo with normal EF, RVSP of 39. Feels well, continues to have chest pain with deep breaths. Has not been out of bed, was using crutches prior to admission. Continues to have pain in left ankle.     Medications:   Scheduled Meds:   sucralfate  1 g Oral 2 times per day    sodium chloride flush  5-40 mL IntraVENous 2 times per day    heparin (porcine) PF  80 Units/kg IntraVENous Once    pantoprazole  40 mg Oral QAM AC     Continuous Infusions:   heparin (PORCINE) Infusion 19 Units/kg/hr (06/21/21 0730)    sodium chloride       PRN Meds:heparin (porcine) PF, heparin (porcine) PF, oxyCODONE, sodium chloride flush, sodium chloride, potassium chloride **OR** potassium chloride, magnesium sulfate, sodium phosphate IVPB **OR** sodium phosphate IVPB **OR** sodium phosphate IVPB, ondansetron **OR** ondansetron, polyethylene glycol, acetaminophen **OR** acetaminophen, traZODone, pregabalin      Objective:    BP 133/85   Pulse 94   Temp 98.4 F (36.9 C) (Oral)   Resp 18   Ht 5\' 7"  (1.702 m)   Wt 211 lb 10.3 oz (96 kg)   SpO2 98%   BMI 33.15 kg/m     Physical Exam:  General: alert and oriented x 3, calm, comfortable appearing  HEENT: moist mucus membranes, normocephalic  Cardiac: regular rate and rhythm, no clicks, rubs, or murmurs   Pulmonary: clear to auscultation bilaterally, no respiratory distress  GI: soft, non-tender, no rebound or guarding, non-distended, active bowel sounds  MSK: no joint tenderness or swelling, no edema   Skin: no rashes or lesion, dry and warm   Neuro: no confusion, strength symmetric bilaterally, no numbness in extremities     Cardiographics  06/20/21    TRANSTHORACIC  ECHOCARDIOGRAM (TTE) COMPLETE (CONTRAST/BUBBLE/3D PRN) 06/20/2021  6:53 PM (Final)    Interpretation Summary    Left Ventricle: Normal left ventricular systolic function with a visually estimated EF of 70 - 75%. Left ventricle is smaller than normal. Mildly increased wall thickness. Findings consistent with mild concentric hypertrophy. Normal wall motion.    Tricuspid Valve: Mildly elevated RVSP. The estimated RVSP is 39 mmHg.    IVC/SVC: IVC diameter is less than or equal to 21 mm and decreases greater than 50% during inspiration; therefore the estimated right atrial pressure is normal (~3 mmHg). IVC size is normal.    Signed by: 08/20/2021, MD on 06/20/2021  6:53 PM      Imaging  CTA Chest W/WO Contrast PE Eval    Result Date: 06/20/2021  CTA chest: 06/20/21 INDICATION: "PE". Shortness of breath. History of recent surgery. COMPARISON: None TECHNIQUE: PE protocol (Imaging from the thoracic inlet through the hemidiaphragms following contrast in the pulmonary arterial phase. Axial soft tissue and lung 5 x 5 mm images. Additional multiplanar 3-D volumetric MIP reconstructions through the pulmonary arteries were generated and reviewed per protocol.) CT scanning was performed using the radiation dose reduction technique when appropriate, per system protocols. CHEST: Thyroid is unremarkable. No evidence of supraclavicular or axillary lymphadenopathy. No pericardial effusion. No mediastinal or hilar lymphadenopathy. Coronary artery calcifications, particularly involving thead. No lobar consolidation. Probable atelectasis in  the lung bases, right greater than left. There is a 7 x 9 mm pulmonary nodule within the right middle lobe, best appreciated on series 6 image 69 and series 9 image 72. Minimal surrounding  groundglass opacity. Findings may be infectious or inflammatory. Short interval  follow-up CT the chest in 3 months is recommended. Further evaluation with PET CT could also be considered. No pleural effusion or  pneumothorax. Acute pulmonary embolus involving the main right and left pulmonary arteries with saddle embolus noted. Extensive thrombus extends into the bilateral lower lobar pulmonary arteries and extends into the segmental and subsegmental branches. Thrombus extends into the right middle lobar as well as the bilateral upper lobar pulmonary arteries. The RV to LV ratio is 1.1 Visualized portions of the upper abdomen are grossly within normal limits. No acute osseous abnormality. No concerning intraosseous lesions.     1. Acute saddle pulmonary embolus involving the right and left main pulmonary arteries with extension into the lobar, segmental, and subsegmental branches as above. Clot burden is most pronounced in the lower lobes bilaterally. Evidence of right heart strain with RV to LV ratio 1.1. 2. Indeterminate 9 x 7 mm pulmonary nodule in the right middle lobe. Minimal surrounding groundglass opacity. Findings may be infectious or inflammatory but this is not definitive. No additional nodules. Short interval follow-up CT the chest in 3 months is recommended. Follow-up routine PET CT could also be considered. 3. Coronary artery calcifications in the LAD. Findings discussed with Dr. Zachery Dauer at 1:45 PM 06/20/2021    Transthoracic echocardiogram (TTE) complete with contrast, bubble, strain, and 3D PRN    Result Date: 06/20/2021    Left Ventricle: Normal left ventricular systolic function with a visually estimated EF of 70 - 75%. Left ventricle is smaller than normal. Mildly increased wall thickness. Findings consistent with mild concentric hypertrophy. Normal wall motion.   Tricuspid Valve: Mildly elevated RVSP. The estimated RVSP is 39 mmHg.   IVC/SVC: IVC diameter is less than or equal to 21 mm and decreases greater than 50% during inspiration; therefore the estimated right atrial pressure is normal (~3 mmHg). IVC size is normal.       Lab Review   Lab Results   Component Value Date    WBC 7.6 06/21/2021    HGB 12.7  (L) 06/21/2021    HCT 41.1 06/21/2021    MCV 84.6 06/21/2021    PLT 180 06/21/2021     Lab Results   Component Value Date    NA 138 06/21/2021    K 3.4 (L) 06/21/2021    CL 100 06/21/2021    CO2 28 06/21/2021    BUN 15 06/21/2021    CREATININE 1.2 06/21/2021    GLUCOSE 107 (H) 06/21/2021    CALCIUM 8.9 06/21/2021    PROT 6.9 06/21/2021    LABALBU 3.8 06/21/2021    BILITOT 0.84 06/21/2021    ALKPHOS 120 06/21/2021    AST 52 (H) 06/21/2021    ALT 67 (H) 06/21/2021    LABGLOM 74 06/21/2021    GFRAA 103 05/14/2019    GLOB 2.7 05/14/2019       Assessment/Plan   Principal Problem:    Pulmonary embolism and infarction [facility])  Resolved Problems:    * No resolved hospital problems. *    Acute saddle Pulmonary embolism without cor pulmonale  -Patient admitted to [facility] ICU after presenting with left ankle edema and tenderness, ultrasound showing femoral DVT with CT angiogram showing saddle pulmonary embolism without right  heart strain.  [facility], patient did well with no hemodynamic instability, and is tolerating heparin drip. Echocardiogram showed acceptable ejection fraction and only mild hypertrophy. BNP, troponins have remained low.  - transition to oral anticoagulation in the next 24 hours     DVT  -Left lower extremity DVT as above.     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  Weightbearing as tolerated.  Pain issues, continue with as needed oxycodone.  Resume gabapentin     Insomnia  -Continue home trazodone at night     Coronary calcifications  -Angiogram showing left anterior descending calcifications.  Baseline EKG.  Patient instructed to follow-up with cardiology as an outpatient for ischemic testing .    Essential hypertension  Losartan on hold as normotensive.    Diet: ADULT DIET; Regular  Dvt prophy: heparin drip   Full Code  Dispo: continued inpatient admission for monitoring of PE   Tyrone SchimkeAllison Hadsall Hajec, MD

## 2021-06-21 NOTE — Care Coordination-Inpatient (Signed)
Per wife patient is [facility] usually presents to [place] or clinic in [place]. Left ankle surgery recently, pt has been utilizes crutches per wife.      06/21/21 1435   Service Assessment   Patient Orientation Alert and Oriented;Person;Place;Situation   Cognition Alert   History Provided By Patient   Primary Caregiver Self   Support Systems Spouse/Significant Other;Family Members   Patient's Healthcare Decision Maker is: Legal Next of Kin   PCP Verified by CM Yes  ([facility] [place])   Last Visit to PCP Within last 3 months   Prior Functional Level Assistance with the following:;Independent in ADLs/IADLs;Mobility   Current Functional Level Independent in ADLs/IADLs;Assistance with the following:;Mobility   Can patient return to prior living arrangement Yes   Ability to make needs known: Good   Family able to assist with home care needs: Yes   Would you like for me to discuss the discharge plan with any other family members/significant others, and if so, who? No   Community Resources None   CM/SW Referral Functions dependency needs;ADLs/IADLs   Social/Functional History   Lives With Spouse   Type of Home House   Home Layout One level   Home Access Stairs to enter with rails   Entrance Stairs - Number of Steps 4   Entrance Stairs - Rails None   Armed forces logistics/support/administrative officer   ADL Assistance Needs assistance   Homemaking Assistance Needs assistance   Ambulation Assistance Independent   Active Driver No   Occupation Retired   Building control surveyor   Type of Residence [employer]   Living Arrangements Spouse/Significant Other   Current Services Prior To Admission Horticulturist, commercial   Current DME Prior to [employer]   Potential Assistance Needed Home Care   DME Ordered? No   Potential Assistance Purchasing Medications No   Type of Home Care Services PT;Nursing Services;OT   Patient expects to be discharged to: House   One/Two Story  Residence One story   History of falls? 0

## 2021-06-21 NOTE — Significant Event (Signed)
RRT paged at 1653 to see pt for second opinion. Discussed with RN and pt the finding of atelectasis on his cxr.  Pt already using his IS when RRT arrived. Pt c/o intermittent pain in right side and sob with movement.  O2 sat 100% on RA.  Uses O2 when sob. Pt had questions about his discharge date and resuming physical therapy that I could not answer.  Pt to ask MD tomorrow.  Rn comfortable with current plan of care. Agrees to call for further concerns. Call ended at 1726.

## 2021-06-21 NOTE — Progress Notes (Signed)
ICU Progress Note    Date:06/21/2021       Room:5004/01  Patient Name:Ronald Herrera     Date of Birth:05/10/1970     Age:51 y.o.    Subjective   Did well overnight.  Remains on room air.  Stable hemodynamics with normal blood pressure.  Echocardiogram shows normal ejection fraction, mild hypertrophy with an RVSP of 39.  Continue with heparin drip.    Review of Systems   No fevers or chills.  Complains of pain into his ankle, taking Lyrica, oxycodone.    Medications   Scheduled Meds:    sucralfate  1 g Oral 2 times per day    sodium chloride flush  5-40 mL IntraVENous 2 times per day    heparin (porcine) PF  80 Units/kg IntraVENous Once    pantoprazole  40 mg Oral QAM AC     Continuous Infusions:    heparin (PORCINE) Infusion 19 Units/kg/hr (06/21/21 0730)    sodium chloride       PRN Meds: heparin (porcine) PF, heparin (porcine) PF, oxyCODONE, sodium chloride flush, sodium chloride, potassium chloride **OR** potassium chloride, magnesium sulfate, sodium phosphate IVPB **OR** sodium phosphate IVPB **OR** sodium phosphate IVPB, ondansetron **OR** ondansetron, polyethylene glycol, acetaminophen **OR** acetaminophen, traZODone, pregabalin      Physical Examination      Vitals:  BP 133/85   Pulse 94   Temp 99.5 F (37.5 C) (Oral)   Resp 18   Ht 5\' 7"  (1.702 m)   Wt 211 lb 10.3 oz (96 kg)   SpO2 98%   BMI 33.15 kg/m   Temp (24hrs), Avg:98.4 F (36.9 C), Min:97.5 F (36.4 C), Max:99.5 F (37.5 C)      I/O (24Hr):    Intake/Output Summary (Last 24 hours) at 06/21/2021 0759  Last data filed at 06/21/2021 0700  Gross per 24 hour   Intake 313.47 ml   Output 450 ml   Net -136.53 ml       General: Awake and alert. Answers all questions.  Eye: Conjunctiva clear  HENT: Moist oral mucosa.  Neck: Trachea midline. No cervical adenopathy.  Lungs: Equal breath sounds with no wheeze  Heart: Regular rhythm and rate  Abdomen: Soft and flat, no distention.  Musculoskeletal: Left lower extremity edema, tenderness.  Healed surgical  incision left ankle, left ankle tenderness. Strong dorsalis pedis pulses  Skin: Skin is warm, dry   Neurologic: Awake, alert, and oriented X3. Moves hands and feet symmetrically.  Psychiatric: Pleasant     Labs/Imaging/Diagnostics   Labs:  CBC:  Recent Labs     06/20/21  1104 06/20/21  1233 06/21/21  0446   WBC 6.7 6.3 7.6   RBC 5.51 5.33 4.86   HGB 14.4 14.0 12.7*   HCT 46.5 45.2 41.1   MCV 84.4 84.8 84.6   RDW 14.4 14.5 14.4   PLT 179 163 180     CHEMISTRIES:  Recent Labs     06/20/21  1104 06/21/21  0446   NA 141 138   K 3.6 3.4*   CL 101 100   CO2 29 28   BUN 17 15   CREATININE 1.3 1.2   GLUCOSE 112* 107*   MG  --  2.0     PT/INR:No results for input(s): PROTIME, INR in the last 72 hours.  APTT:  Recent Labs     06/20/21  1742 06/21/21  0020 06/21/21  0633   APTT 92.2* 65.1* 109.4*     LIVER PROFILE:  Recent Labs     06/21/21  0446   AST 52*   ALT 67*   BILIDIR 0.30   BILITOT 0.84   ALKPHOS 120       Imaging Last 24 Hours:  CTA Chest W/WO Contrast PE Eval    Result Date: 06/20/2021  CTA chest: 06/20/21 INDICATION: "PE". Shortness of breath. History of recent surgery. COMPARISON: None TECHNIQUE: PE protocol (Imaging from the thoracic inlet through the hemidiaphragms following contrast in the pulmonary arterial phase. Axial soft tissue and lung 5 x 5 mm images. Additional multiplanar 3-D volumetric MIP reconstructions through the pulmonary arteries were generated and reviewed per protocol.) CT scanning was performed using the radiation dose reduction technique when appropriate, per system protocols. CHEST: Thyroid is unremarkable. No evidence of supraclavicular or axillary lymphadenopathy. No pericardial effusion. No mediastinal or hilar lymphadenopathy. Coronary artery calcifications, particularly involving thead. No lobar consolidation. Probable atelectasis in the lung bases, right greater than left. There is a 7 x 9 mm pulmonary nodule within the right middle lobe, best appreciated on series 6 image 69 and series  9 image 72. Minimal surrounding  groundglass opacity. Findings may be infectious or inflammatory. Short interval  follow-up CT the chest in 3 months is recommended. Further evaluation with PET CT could also be considered. No pleural effusion or pneumothorax. Acute pulmonary embolus involving the main right and left pulmonary arteries with saddle embolus noted. Extensive thrombus extends into the bilateral lower lobar pulmonary arteries and extends into the segmental and subsegmental branches. Thrombus extends into the right middle lobar as well as the bilateral upper lobar pulmonary arteries. The RV to LV ratio is 1.1 Visualized portions of the upper abdomen are grossly within normal limits. No acute osseous abnormality. No concerning intraosseous lesions.     1. Acute saddle pulmonary embolus involving the right and left main pulmonary arteries with extension into the lobar, segmental, and subsegmental branches as above. Clot burden is most pronounced in the lower lobes bilaterally. Evidence of right heart strain with RV to LV ratio 1.1. 2. Indeterminate 9 x 7 mm pulmonary nodule in the right middle lobe. Minimal surrounding groundglass opacity. Findings may be infectious or inflammatory but this is not definitive. No additional nodules. Short interval follow-up CT the chest in 3 months is recommended. Follow-up routine PET CT could also be considered. 3. Coronary artery calcifications in the LAD. Findings discussed with Dr. Zachery Dauer at 1:45 PM 06/20/2021    Transthoracic echocardiogram (TTE) complete with contrast, bubble, strain, and 3D PRN    Result Date: 06/20/2021    Left Ventricle: Normal left ventricular systolic function with a visually estimated EF of 70 - 75%. Left ventricle is smaller than normal. Mildly increased wall thickness. Findings consistent with mild concentric hypertrophy. Normal wall motion.   Tricuspid Valve: Mildly elevated RVSP. The estimated RVSP is 39 mmHg.   IVC/SVC: IVC diameter is less than  or equal to 21 mm and decreases greater than 50% during inspiration; therefore the estimated right atrial pressure is normal (~3 mmHg). IVC size is normal.         Assessment        Hospital Problems             Last Modified POA    * (Principal) Pulmonary embolism and infarction (HCC) 06/20/2021 Yes       Plan:        Pulmonary embolism  -Patient admitted to [facility] ICU after presenting with left ankle edema and tenderness, ultrasound  showing femoral DVT with CT angiogram showing saddle pulmonary embolism without right heart strain.  Transferred to [facility] for considerations for thrombectomy, patient did well with no hemodynamic instability, and is tolerating heparin drip.  Appropriate to transfer him to the floor let the hospitalist transition him to oral anticoagulation over the next 24 hours. Echocardiogram showed acceptable ejection fraction and only mild hypertrophy. BNP, troponins have remained low.     DVT  -Left lower extremity DVT as above.     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  Weightbearing as tolerated.  Pain issues, continue with as needed oxycodone, Lyrica.     Insomnia  -Continue home trazodone at night     Coronary calcifications  -Angiogram showing left anterior descending calcifications.  Baseline EKG.  Patient instructed to follow-up with cardiology as an outpatient.     Nutrition: Diet as tolerated  Disposition: Transfer to the floor    Attestation Statement:  The patient was seen, examined and evaluated by me. I personally performed a face-to-face examination of the patient and corroborated the findings of this note. A substantive portion (> 50%) of the above assessment and plan were formulated by me personally.

## 2021-06-22 LAB — CBC
Hematocrit: 39.8 % (ref 38.0–52.0)
Hemoglobin: 12.4 g/dL — ABNORMAL LOW (ref 13.0–17.3)
MCH: 26.1 pg — ABNORMAL LOW (ref 27.0–34.5)
MCHC: 31.2 g/dL — ABNORMAL LOW (ref 32.0–36.0)
MCV: 83.6 fL — ABNORMAL LOW (ref 84.0–100.0)
MPV: 11.9 fL (ref 7.2–13.2)
NRBC Absolute: 0 10*3/uL (ref 0.000–0.012)
NRBC Automated: 0 % (ref 0.0–0.2)
Platelets: 173 10*3/uL (ref 140–440)
RBC: 4.76 x10e6/mcL (ref 4.00–5.60)
RDW: 14.1 % (ref 11.0–16.0)
WBC: 7.5 10*3/uL (ref 3.8–10.6)

## 2021-06-22 LAB — BASIC METABOLIC PANEL
Anion Gap: 11 mmol/L (ref 2–17)
BUN: 15 mg/dL (ref 6–20)
CO2: 25 mmol/L (ref 22–29)
Calcium: 8.8 mg/dL (ref 8.6–10.0)
Chloride: 102 mmol/L (ref 98–107)
Creatinine: 1.3 mg/dL (ref 0.7–1.3)
Est, Glom Filt Rate: 67 mL/min/1.73m?? (ref >=60)
Glucose: 120 mg/dL — ABNORMAL HIGH (ref 70–99)
Osmolaliy Calculated: 278 mosm/kg (ref 270–287)
Potassium: 3.6 mmol/L (ref 3.5–5.3)
Sodium: 138 mmol/L (ref 135–145)

## 2021-06-22 LAB — APTT: APTT: 86.4 s — ABNORMAL HIGH (ref 23.3–34.5)

## 2021-06-22 LAB — CALCIUM, IONIZED: Calcium, Ionized: 4.3 mg/dL — ABNORMAL LOW (ref 4.5–5.3)

## 2021-06-22 LAB — POCT GLUCOSE: POC Glucose: 116 mg/dL — ABNORMAL HIGH (ref 65.0–110.0)

## 2021-06-22 MED ORDER — GABAPENTIN 300 MG PO CAPS
300 MG | ORAL_CAPSULE | Freq: Three times a day (TID) | ORAL | 0 refills | Status: AC
Start: 2021-06-22 — End: 2021-07-22

## 2021-06-22 MED ORDER — APIXABAN 5 MG PO TABS
5 MG | ORAL_TABLET | Freq: Two times a day (BID) | ORAL | 0 refills | Status: AC
Start: 2021-06-22 — End: ?

## 2021-06-22 MED ORDER — APIXABAN 5 MG PO TABS
5 MG | Freq: Two times a day (BID) | ORAL | Status: DC
Start: 2021-06-22 — End: 2021-06-22
  Administered 2021-06-22: 15:00:00 10 mg via ORAL

## 2021-06-22 MED ORDER — APIXABAN 5 MG PO TABS
5 MG | Freq: Two times a day (BID) | ORAL | Status: DC
Start: 2021-06-22 — End: 2021-06-22

## 2021-06-22 MED ORDER — APIXABAN 5 MG PO TABS
5 MG | ORAL_TABLET | ORAL | 0 refills | Status: DC
Start: 2021-06-22 — End: 2021-06-22

## 2021-06-22 MED ORDER — APIXABAN 5 MG PO TABS
5 MG | ORAL_TABLET | ORAL | 0 refills | Status: DC
Start: 2021-06-22 — End: 2021-07-06

## 2021-06-22 MED FILL — OXYCODONE HCL 5 MG PO TABS: 5 MG | ORAL | Qty: 1

## 2021-06-22 MED FILL — GABAPENTIN 300 MG PO CAPS: 300 MG | ORAL | Qty: 2

## 2021-06-22 MED FILL — BD POSIFLUSH 0.9 % IV SOLN: 0.9 % | INTRAVENOUS | Qty: 10

## 2021-06-22 MED FILL — PANTOPRAZOLE SODIUM 40 MG PO TBEC: 40 MG | ORAL | Qty: 1

## 2021-06-22 MED FILL — SUCRALFATE 1 G PO TABS: 1 GM | ORAL | Qty: 1

## 2021-06-22 MED FILL — HEPARIN SOD (PORCINE) IN D5W 100 UNIT/ML IV SOLN: 100 UNIT/ML | INTRAVENOUS | Qty: 250

## 2021-06-22 MED FILL — TYLENOL 325 MG PO TABS: 325 MG | ORAL | Qty: 2

## 2021-06-22 MED FILL — ELIQUIS 5 MG PO TABS: 5 MG | ORAL | Qty: 2

## 2021-06-22 NOTE — Progress Notes (Signed)
Hospitalist Progress note    Subjective:   After arrival to floor reported significant shortness of breath.  Chest x-ray with atelectasis.  Incentive spirometry encouraged.  Rapid response additionally evaluated.  According to his spouse at bedside she feels like he looks worse now than he did when he initially came into the hospital.  He was febrile to 38.1 overnight reports feeling chills denies any cough, dysuria, or diarrhea.  Has not yet worked with physical therapy or been out of bed since hospitalization    Medications:   Scheduled Meds:   sucralfate  1 g Oral 2 times per day    gabapentin  600 mg Oral TID    sodium chloride flush  5-40 mL IntraVENous 2 times per day    heparin (porcine) PF  80 Units/kg IntraVENous Once    pantoprazole  40 mg Oral QAM AC     Continuous Infusions:   heparin (PORCINE) Infusion 19 Units/kg/hr (06/22/21 0642)    sodium chloride       PRN Meds:heparin (porcine) PF, heparin (porcine) PF, oxyCODONE, sodium chloride flush, sodium chloride, potassium chloride **OR** potassium chloride, magnesium sulfate, sodium phosphate IVPB **OR** sodium phosphate IVPB **OR** sodium phosphate IVPB, ondansetron **OR** ondansetron, polyethylene glycol, acetaminophen **OR** acetaminophen, traZODone      Objective:    BP 117/68   Pulse (!) 111   Temp 100.4 F (38 C) (Oral)   Resp 20   Ht 5\' 7"  (1.702 m)   Wt 211 lb 10.3 oz (96 kg)   SpO2 93%   BMI 33.15 kg/m     Physical Exam:  General: alert and oriented x 3, calm, comfortable appearing  HEENT: moist mucus membranes, normocephalic  Cardiac: Tachycardic rate and rhythm, no clicks, rubs, or murmurs   Pulmonary: clear to auscultation bilaterally, no respiratory distress  GI: soft, non-tender, no rebound or guarding, non-distended, active bowel sounds  MSK: no joint tenderness or swelling, no edema   Skin: no rashes or lesion, dry and warm   Neuro: no confusion, strength symmetric bilaterally, no numbness in extremities      Cardiographics  06/20/21    TRANSTHORACIC ECHOCARDIOGRAM (TTE) COMPLETE (CONTRAST/BUBBLE/3D PRN) 06/20/2021  6:53 PM (Final)    Interpretation Summary    Left Ventricle: Normal left ventricular systolic function with a visually estimated EF of 70 - 75%. Left ventricle is smaller than normal. Mildly increased wall thickness. Findings consistent with mild concentric hypertrophy. Normal wall motion.    Tricuspid Valve: Mildly elevated RVSP. The estimated RVSP is 39 mmHg.    IVC/SVC: IVC diameter is less than or equal to 21 mm and decreases greater than 50% during inspiration; therefore the estimated right atrial pressure is normal (~3 mmHg). IVC size is normal.    Signed by: 08/20/2021, MD on 06/20/2021  6:53 PM      Imaging  CTA Chest W/WO Contrast PE Eval    Result Date: 06/20/2021  CTA chest: 06/20/21 INDICATION: "PE". Shortness of breath. History of recent surgery. COMPARISON: None TECHNIQUE: PE protocol (Imaging from the thoracic inlet through the hemidiaphragms following contrast in the pulmonary arterial phase. Axial soft tissue and lung 5 x 5 mm images. Additional multiplanar 3-D volumetric MIP reconstructions through the pulmonary arteries were generated and reviewed per protocol.) CT scanning was performed using the radiation dose reduction technique when appropriate, per system protocols. CHEST: Thyroid is unremarkable. No evidence of supraclavicular or axillary lymphadenopathy. No pericardial effusion. No mediastinal or hilar lymphadenopathy. Coronary artery calcifications, particularly involving thead.  No lobar consolidation. Probable atelectasis in the lung bases, right greater than left. There is a 7 x 9 mm pulmonary nodule within the right middle lobe, best appreciated on series 6 image 69 and series 9 image 72. Minimal surrounding  groundglass opacity. Findings may be infectious or inflammatory. Short interval  follow-up CT the chest in 3 months is recommended. Further evaluation with PET CT could  also be considered. No pleural effusion or pneumothorax. Acute pulmonary embolus involving the main right and left pulmonary arteries with saddle embolus noted. Extensive thrombus extends into the bilateral lower lobar pulmonary arteries and extends into the segmental and subsegmental branches. Thrombus extends into the right middle lobar as well as the bilateral upper lobar pulmonary arteries. The RV to LV ratio is 1.1 Visualized portions of the upper abdomen are grossly within normal limits. No acute osseous abnormality. No concerning intraosseous lesions.     1. Acute saddle pulmonary embolus involving the right and left main pulmonary arteries with extension into the lobar, segmental, and subsegmental branches as above. Clot burden is most pronounced in the lower lobes bilaterally. Evidence of right heart strain with RV to LV ratio 1.1. 2. Indeterminate 9 x 7 mm pulmonary nodule in the right middle lobe. Minimal surrounding groundglass opacity. Findings may be infectious or inflammatory but this is not definitive. No additional nodules. Short interval follow-up CT the chest in 3 months is recommended. Follow-up routine PET CT could also be considered. 3. Coronary artery calcifications in the LAD. Findings discussed with Dr. Zachery DauerBarnes at 1:45 PM 06/20/2021    Transthoracic echocardiogram (TTE) complete with contrast, bubble, strain, and 3D PRN    Result Date: 06/20/2021    Left Ventricle: Normal left ventricular systolic function with a visually estimated EF of 70 - 75%. Left ventricle is smaller than normal. Mildly increased wall thickness. Findings consistent with mild concentric hypertrophy. Normal wall motion.   Tricuspid Valve: Mildly elevated RVSP. The estimated RVSP is 39 mmHg.   IVC/SVC: IVC diameter is less than or equal to 21 mm and decreases greater than 50% during inspiration; therefore the estimated right atrial pressure is normal (~3 mmHg). IVC size is normal.       Lab Review   Lab Results   Component  Value Date    WBC 7.5 06/22/2021    HGB 12.4 (L) 06/22/2021    HCT 39.8 06/22/2021    MCV 83.6 (L) 06/22/2021    PLT 173 06/22/2021     Lab Results   Component Value Date    NA 138 06/22/2021    K 3.6 06/22/2021    CL 102 06/22/2021    CO2 25 06/22/2021    BUN 15 06/22/2021    CREATININE 1.3 06/22/2021    GLUCOSE 120 (H) 06/22/2021    CALCIUM 8.8 06/22/2021    PROT 6.9 06/21/2021    LABALBU 3.8 06/21/2021    BILITOT 0.84 06/21/2021    ALKPHOS 120 06/21/2021    AST 52 (H) 06/21/2021    ALT 67 (H) 06/21/2021    LABGLOM 67 06/22/2021    GFRAA 103 05/14/2019    GLOB 2.7 05/14/2019       Assessment/Plan   Principal Problem:    Pulmonary embolism and infarction [facility](HCC)  Resolved Problems:    * No resolved hospital problems. *    Acute saddle Pulmonary embolism without cor pulmonale  -Patient admitted to [facility] ICU after presenting with left ankle edema and tenderness, ultrasound showing femoral DVT with CT angiogram  showing saddle pulmonary embolism without right heart strain.  Transferred to [facility] for considerations for thrombectomy, patient did well with no hemodynamic instability, and is tolerating heparin drip. Echocardiogram showed acceptable ejection fraction and only mild hypertrophy. BNP, troponins have remained low.  -We will transition from heparin drip to Eliquis    Fever  Suspect to be due to pulmonary emboli however if recurs will obtain if infectious work-up    DVT  -Left lower extremity DVT as above.     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  Weightbearing as tolerated.  Pain issues, continue with as needed oxycodone.  Resume gabapentin  -We will ask PT to see during hospitalization to eval for possible home health needs      Insomnia  -Continue home trazodone at night     Coronary calcifications  -Angiogram showing left anterior descending calcifications.  Baseline EKG.  Patient instructed to follow-up with cardiology as an outpatient for ischemic testing  .    Essential hypertension  Losartan on hold as normotensive.    Diet: ADULT DIET; Regular  Dvt prophy: Eliquis  Full Code  Dispo: continued inpatient admission for monitoring of PE, monitoring fever curve and awaiting PT assessment  Tyrone Schimke, MD

## 2021-06-22 NOTE — Progress Notes (Signed)
Pulmonary Progress Note    Date:06/22/2021       Room:4011/01  Patient Name:Ronald Herrera     Date of Birth:01-29-1971     Age:51 y.o.    Subjective   Interval History Status: transferred out of ICU yesterday;. . AF, HDS, on room air; no acute events; no cough, no sputum, no hemoptysis, no chest pain, no dyspnea at rest, no bleeding, no N/V; not out of bed yet        Review of Systems   ROS as previously reviewed     Medications   Scheduled Meds:    apixaban  10 mg Oral BID    Followed by    Melene Muller ON 06/29/2021] apixaban  5 mg Oral BID    sucralfate  1 g Oral 2 times per day    gabapentin  600 mg Oral TID    sodium chloride flush  5-40 mL IntraVENous 2 times per day    pantoprazole  40 mg Oral QAM AC     Continuous Infusions:    sodium chloride       PRN Meds: oxyCODONE, sodium chloride flush, sodium chloride, potassium chloride **OR** potassium chloride, magnesium sulfate, sodium phosphate IVPB **OR** sodium phosphate IVPB **OR** sodium phosphate IVPB, ondansetron **OR** ondansetron, polyethylene glycol, acetaminophen **OR** acetaminophen, traZODone      Physical Examination      Vitals:  BP (!) 112/91   Pulse 91   Temp 98.4 F (36.9 C) (Oral)   Resp 18   Ht 5\' 7"  (1.702 m)   Wt 211 lb 10.3 oz (96 kg)   SpO2 98%   BMI 33.15 kg/m   Temp (24hrs), Avg:99 F (37.2 C), Min:98 F (36.7 C), Max:100.5 F (38.1 C)      I/O (24Hr):    Intake/Output Summary (Last 24 hours) at 06/22/2021 1307  Last data filed at 06/22/2021 1226  Gross per 24 hour   Intake 480 ml   Output 675 ml   Net -195 ml       General: Alert, cooperative, no obvious distress  Neck: supple, no thyroidmegaly  Lungs: Clear to auscultation, non-labored respirations, no wheezing  Heart: Normal rate, regular rhythm, no murmur  Musculoskeletal: Warm, well perfused, no joint swelling or tenderness  Abdomen: Soft, non-tender, non-distended, normal bowel sounds  Neurologic: No focal weakness, CN II-XII intact  Extremities: no cyanosis, no edema      Labs/Imaging/Diagnostics   Labs:  CBC:  Recent Labs     06/20/21  1233 06/21/21  0446 06/22/21  0515   WBC 6.3 7.6 7.5   RBC 5.33 4.86 4.76   HGB 14.0 12.7* 12.4*   HCT 45.2 41.1 39.8   MCV 84.8 84.6 83.6*   RDW 14.5 14.4 14.1   PLT 163 180 173     CHEMISTRIES:  Recent Labs     06/20/21  1104 06/21/21  0446 06/22/21  0515   NA 141 138 138   K 3.6 3.4* 3.6   CL 101 100 102   CO2 29 28 25    BUN 17 15 15    CREATININE 1.3 1.2 1.3   GLUCOSE 112* 107* 120*   MG  --  2.0  --      PT/INR:No results for input(s): PROTIME, INR in the last 72 hours.  APTT:  Recent Labs     06/21/21  0633 06/21/21  1431 06/21/21  2059   APTT 109.4* 83.5* 86.4*     LIVER PROFILE:  Recent Labs  06/21/21  0446   AST 52*   ALT 67*   BILIDIR 0.30   BILITOT 0.84   ALKPHOS 120       Imaging Last 24 Hours:  XR CHEST PORTABLE    Result Date: 06/21/2021  Chest AP: 06/21/21 INDICATION: "shortness of breath, hx of PE". COMPARISON: CT from the day prior. FINDINGS: Diminished lung volumes. Suspect atelectasis of the lung bases. Right hemidiaphragm is mildly elevated. Heart size is grossly within normal limits for  this portable AP technique. No pleural effusion or pneumothorax.     Diminished lung volumes with probable atelectasis.    Transthoracic echocardiogram (TTE) complete with contrast, bubble, strain, and 3D PRN    Result Date: 06/20/2021    Left Ventricle: Normal left ventricular systolic function with a visually estimated EF of 70 - 75%. Left ventricle is smaller than normal. Mildly increased wall thickness. Findings consistent with mild concentric hypertrophy. Normal wall motion.   Tricuspid Valve: Mildly elevated RVSP. The estimated RVSP is 39 mmHg.   IVC/SVC: IVC diameter is less than or equal to 21 mm and decreases greater than 50% during inspiration; therefore the estimated right atrial pressure is normal (~3 mmHg). IVC size is normal.         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

## 2021-06-22 NOTE — Discharge Instructions (Addendum)
Check your blood pressure daily.   Your blood pressure has been normal off of your blood pressure medications (losartan and hydrochlorothiazide). I would recommend you do NOT start them back until your blood pressure is over 140     Your primary care [facility] at least 3 months. You will need a repeat echo and CT scan of your lungs done in 3 months. The pulmonologist can schedule these. Please contact his clinic to arrange a follow up appointment.

## 2021-06-22 NOTE — Discharge Summary (Signed)
[facility]  Discharge Summary      Patient Name:  Ronald Herrera    Patient Age:  51 y.o.    Patient DOB:  1970-06-12    MRN:  [id number]    Admit date:  06/20/2021    Discharge date:   06/22/2021     Admitting Physician:  Janene HarveyJohn Albert Mitchell, MD     Admission Diagnoses:  Pulmonary embolism and infarction [facility](HCC) [I26.99]    Discharge Physician:  Tyrone SchimkeAllison Kutter Hajec, MD     Discharge Diagnoses:   Acute pulmonary emboli without cor pulmonale    Consults:   IP CONSULT TO PHARMACY TECH    Code status  Full Code     Disposition:  home   Findings Requiring Follow-Up:  Will need monitoring of blood pressure as medications were held    Needs follow up echo and CTA in 3 months, for pulmonary hypertension/nodule/PE will follow up with pulmonary clinic      Hospital Stay   HPI (from admission H&P):     51 year old male with history of hypertension, hyperlipidemia GERD.  Underwent ankle surgery in March, has been limited by pain, initially 6 weeks of nonweightbearing.  He was evaluated in the [facility] emergency department 5/5 with left leg edema and pain.  Secondary to a recent long car ride and his recent orthopedic surgery, there was concern for the possibility of blood clot.  Patient was instructed directly to the [place] emergency department where he could undergo ultrasound as well as CT angiogram, however due to family issues he was unable to present yesterday.  [facility] where he undergoes an ultrasound which shows a large left lower extremity DVT.  His D-dimer was elevated above 11.  Underwent CT angiogram showing pulmonary embolism without right heart strain.  He receives IV heparin, started on a heparin drip and is being [facility].  Has remained stable hemodynamically, blood pressure 130/100 on presentation with a pulse of 72.  Normal oxygen saturations on room air.  On arrival to [facility], he is comfortable.  Asking for  a diet.  His blood pressure remained stable 129/88, 100% oxygen saturations on room air.  Pulse is slightly elevated at 103.  Denies any history of any blood clots in his legs or in his lungs.  He does admit to a long car ride earlier this week.  He does state that significant shortness of breath with acute onset yesterday afternoon while in the ER.    Hospital Course:   He was initially admitted to the ICU service on a heparin drip. He remained stable on room air with normal hemodynamics.  Echo with normal ejection fraction, mild hypertrophy, RVSP 39.  He was transferred out to floor and transitioned over to Eliquis.  He had an isolated fever without other evidence of infection which was attributed to PE.  He will need at least 3 months treatment for submassive acute PE.  Need repeat echo in 3 months for pulmonary hypertension.  he will additionally need a CT scan repeated in 3 months for pulmonary nodule follow-up.  Consideration for CTA for follow-up of emboli mentioned.  He was given information for outpatient follow-up with pulmonary. He ambulated on room air prior to discharge    His antihypertensives were held during hospitalization.  Recommended he check blood pressure daily and once elevated he can safely resume.      Discharge Exam:  Vitals:    06/22/21 0343 06/22/21 0951 06/22/21 1226 06/22/21 1552   BP: 117/68 123/81 (!) 112/91 121/86   Pulse: (!) 111 93 91 91   Resp: 20 18 18 18    Temp: 100.4 F (38 C) 98 F (36.7 C) 98.4 F (36.9 C) 98.2 F (36.8 C)   TempSrc: Oral Oral Oral Oral   SpO2: 93% 99% 98% 100%   Weight:       Height:        Body mass index is 33.15 kg/m.  General: alert and oriented x 3, calm, comfortable appearing  HEENT: moist mucus membranes, normocephalic  Cardiac: regular rate and rhythm, no clicks, rubs, or murmurs   Pulmonary: clear to auscultation bilaterally, no respiratory distress  GI: soft, non-tender, no rebound or guarding, non-distended, active bowel sounds  MSK: Left  ankle tenderness  Skin: no rashes or lesion, dry and warm   Neuro: no confusion, strength symmetric bilaterally, no numbness in extremities       Surgeries/Procedures Performed:   none    Microbiology:  No results found for: ORG    Recent Labs: CBC:   Lab Results   Component Value Date/Time    WBC 7.5 06/22/2021 05:15 AM    RBC 4.76 06/22/2021 05:15 AM    HGB 12.4 06/22/2021 05:15 AM    HCT 39.8 06/22/2021 05:15 AM    MCV 83.6 06/22/2021 05:15 AM    MCH 26.1 06/22/2021 05:15 AM    MCHC 31.2 06/22/2021 05:15 AM    RDW 14.1 06/22/2021 05:15 AM    PLT 173 06/22/2021 05:15 AM     BMP:    Lab Results   Component Value Date/Time    GLUCOSE 120 06/22/2021 05:15 AM    NA 138 06/22/2021 05:15 AM    K 3.6 06/22/2021 05:15 AM    CL 102 06/22/2021 05:15 AM    CO2 25 06/22/2021 05:15 AM    ANIONGAP 11 06/22/2021 05:15 AM    BUN 15 06/22/2021 05:15 AM    CREATININE 1.3 06/22/2021 05:15 AM    CALCIUM 8.8 06/22/2021 05:15 AM    LABGLOM 67 06/22/2021 05:15 AM    GFRAA 103 05/14/2019 03:56 PM     HFP:    Lab Results   Component Value Date/Time    PROT 6.9 06/21/2021 04:46 AM       Radiology Last 7 Days:  CTA Chest W/WO Contrast PE Eval    Result Date: 06/20/2021  1. Acute saddle pulmonary embolus involving the right and left main pulmonary arteries with extension into the lobar, segmental, and subsegmental branches as above. Clot burden is most pronounced in the lower lobes bilaterally. Evidence of right heart strain with RV to LV ratio 1.1. 2. Indeterminate 9 x 7 mm pulmonary nodule in the right middle lobe. Minimal surrounding groundglass opacity. Findings may be infectious or inflammatory but this is not definitive. No additional nodules. Short interval follow-up CT the chest in 3 months is recommended. Follow-up routine PET CT could also be considered. 3. Coronary artery calcifications in the LAD. Findings discussed with Dr. 08/20/2021 at 1:45 PM 06/20/2021    XR CHEST PORTABLE    Result Date: 06/21/2021  Diminished lung volumes with  probable atelectasis.    06/20/21    TRANSTHORACIC ECHOCARDIOGRAM (TTE) COMPLETE (CONTRAST/BUBBLE/3D PRN) 06/20/2021  6:53 PM (Final)    Interpretation Summary    Left Ventricle: Normal left ventricular systolic function with a visually estimated EF of 70 - 75%. Left ventricle  is smaller than normal. Mildly increased wall thickness. Findings consistent with mild concentric hypertrophy. Normal wall motion.    Tricuspid Valve: Mildly elevated RVSP. The estimated RVSP is 39 mmHg.    IVC/SVC: IVC diameter is less than or equal to 21 mm and decreases greater than 50% during inspiration; therefore the estimated right atrial pressure is normal (~3 mmHg). IVC size is normal.    Signed by: Adah Perl, MD on 06/20/2021  6:53 PM       Discharge Medications      Current Outpatient Medications   Medication Instructions    acetaminophen (TYLENOL) 1,000 mg, Oral, 2 TIMES DAILY    acyclovir (ZOVIRAX) 400 MG tablet 1 tablet, Oral, 2 TIMES DAILY    apixaban (ELIQUIS) 5 MG TABS tablet Take 2 tablets by mouth 2 times daily for 7 days, THEN 1 tablet 2 times daily for 24 days.    atorvastatin (LIPITOR) 40 mg, Oral, DAILY    cetirizine (ZYRTEC) 10 mg, Oral, EVERY MORNING    cyproheptadine (PERIACTIN) 8 mg, Oral, NIGHTLY    DULoxetine (CYMBALTA) 60 mg, Oral, DAILY    ergocalciferol (ERGOCALCIFEROL) 50,000 Units, Oral, WEEKLY, (Monday)    gabapentin (NEURONTIN) 600 mg, Oral, 3 TIMES DAILY    hydroCHLOROthiazide (HYDRODIURIL) 25 mg, Oral, EVERY MORNING    losartan (COZAAR) 100 MG tablet Take 1 tablet by mouth daily    Multiple Vitamins-Minerals (THERAPEUTIC MULTIVITAMIN-MINERALS) tablet 1 tablet, Oral, DAILY    naloxone (NARCAN) 4 MG/0.1ML LIQD nasal spray 1 spray, Nasal, PRN    olopatadine (PATANOL) 0.1 % ophthalmic solution 1 drop, Both Eyes, DAILY PRN    oxyCODONE (ROXICODONE) 5 mg, Oral, EVERY 6 HOURS PRN    pantoprazole (PROTONIX) 40 mg, Oral, EVERY MORNING    sucralfate (CARAFATE) 1 g, Oral, 3 TIMES DAILY    tadalafil (CIALIS) 20  mg, Oral, PRN    traZODone (DESYREL) 150 mg, Oral, NIGHTLY    vitamin C 250 mg, Oral, DAILY        Current Discharge Medication List             Details   apixaban (ELIQUIS) 5 MG TABS tablet Take 2 tablets by mouth 2 times daily for 7 days, THEN 1 tablet 2 times daily for 24 days.  Qty: 76 tablet, Refills: 0                Details   gabapentin (NEURONTIN) 300 MG capsule Take 2 capsules by mouth 3 times daily for 30 days.  Qty: 180 capsule, Refills: 0                Details   DULoxetine (CYMBALTA) 60 MG extended release capsule Take 1 capsule by mouth daily      tadalafil (CIALIS) 20 MG tablet Take 1 tablet by mouth as needed for Erectile Dysfunction      Multiple Vitamins-Minerals (THERAPEUTIC MULTIVITAMIN-MINERALS) tablet Take 1 tablet by mouth daily      Ascorbic Acid (VITAMIN C) 250 MG tablet Take 1 tablet by mouth daily      acyclovir (ZOVIRAX) 400 MG tablet Take 1 tablet by mouth 2 times daily      acetaminophen (TYLENOL) 500 MG tablet Take 2 tablets by mouth 2 times daily      atorvastatin (LIPITOR) 40 MG tablet Take 1 tablet by mouth daily      cyproheptadine (PERIACTIN) 4 MG tablet Take 2 tablets by mouth nightly      oxyCODONE (ROXICODONE) 5 MG immediate release tablet  Take 1 tablet by mouth every 6 hours as needed for Pain.      naloxone (NARCAN) 4 MG/0.1ML LIQD nasal spray 1 spray by Nasal route as needed for Opioid Reversal  Qty: 2 each, Refills: 1      pantoprazole (PROTONIX) 40 MG tablet Take 1 tablet by mouth every morning      sucralfate (CARAFATE) 1 GM tablet Take 1 tablet by mouth 3 times daily      cetirizine (ZYRTEC) 10 MG tablet Take 1 tablet by mouth every morning      ergocalciferol (ERGOCALCIFEROL) 1.25 MG (50000 UT) capsule Take 1 capsule by mouth once a week (Monday)      hydroCHLOROthiazide (HYDRODIURIL) 25 MG tablet Take 1 tablet by mouth every morning      losartan (COZAAR) 100 MG tablet Take 1 tablet by mouth daily      olopatadine (PATANOL) 0.1 % ophthalmic solution Place 1 drop into  both eyes daily as needed for Allergies      traZODone (DESYREL) 100 MG tablet Take 1.5 tablets by mouth nightly            Discharge Plan   Provider Follow-Up:   None None    Follow up      Janene Harvey, MD  [address]  [address]  [place] [address]  [phone]    Schedule an appointment as soon as possible for a visit       Follow-up with PCP in 1-2 weeks    In process/preliminary results:  Outstanding Order Results       Date and Time Order Name Status Description    06/20/2021  5:22 PM EKG 12 Lead Preliminary             Patient Instructions      Discharge Activity:  activity as tolerated      Discharge Diet:  regular diet     Discharge Wound Care:     Special instructions       Time Spent on Discharge:  35 minutes were spent in patient examination, evaluation, counseling as well as medication reconciliation, prescriptions for required medications, discharge plan and follow up.        Electronically signed by Tyrone Schimke, MD on 06/22/21 at 4:06 PM EDT

## 2021-06-23 LAB — EKG 12-LEAD
P Axis: 18 degrees
P-R Interval: 152 ms
Q-T Interval: 338 ms
QRS Duration: 92 ms
QTc Calculation (Bazett): 441 ms
R Axis: 24 degrees
T Axis: 31 degrees
Ventricular Rate: 102 {beats}/min

## 2021-06-24 ENCOUNTER — Emergency Department: Admit: 2021-06-24 | Payer: PRIVATE HEALTH INSURANCE

## 2021-06-24 ENCOUNTER — Inpatient Hospital Stay
Admit: 2021-06-24 | Discharge: 2021-06-24 | Disposition: A | Discharge status: Home / Self Care | Payer: PRIVATE HEALTH INSURANCE | Attending: Emergency Medicine

## 2021-06-24 ENCOUNTER — Inpatient Hospital Stay
Admit: 2021-06-24 | Discharge: 2021-06-26 | Disposition: A | Discharge status: Home / Self Care | Payer: PRIVATE HEALTH INSURANCE | Source: Other Acute Inpatient Hospital | Attending: Student in an Organized Health Care Education/Training Program | Admitting: Student in an Organized Health Care Education/Training Program

## 2021-06-24 DIAGNOSIS — I2699 Other pulmonary embolism without acute cor pulmonale: Secondary | ICD-10-CM

## 2021-06-24 DIAGNOSIS — R0600 Dyspnea, unspecified: Secondary | ICD-10-CM

## 2021-06-24 DIAGNOSIS — T81718A Complication of other artery following a procedure, not elsewhere classified, initial encounter: Secondary | ICD-10-CM

## 2021-06-24 LAB — MANUAL DIFFERENTIAL
Basophils %: 1 % (ref 0–2)
Basophils Absolute: 0.1 10*3/uL (ref 0.0–0.2)
Lymphocytes Absolute: 2.4 10*3/uL (ref 1.0–3.2)
Lymphocytes: 28 % (ref 15–45)
Lymphs, Absolute Count, Reactive: 0.3 10*3/uL
Monocytes %: 6 % (ref 4–12)
Monocytes Absolute: 0.5 10*3/uL (ref 0.3–1.0)
Neutrophils %. Manual count: 62 % (ref 42–74)
Neutrophils Absolute: 5.2 10*3/uL (ref 1.6–7.3)
Reactive Lymphocytes: 3 %

## 2021-06-24 LAB — BRAIN NATRIURETIC PEPTIDE: NT Pro-BNP: <50 pg/mL (ref 0–125)

## 2021-06-24 LAB — CBC WITH AUTO DIFFERENTIAL
Hematocrit: 39.3 % (ref 38.0–52.0)
Hemoglobin: 12.5 g/dL — ABNORMAL LOW (ref 13.0–17.3)
MCH: 26.6 pg — ABNORMAL LOW (ref 27.0–34.5)
MCHC: 31.8 g/dL — ABNORMAL LOW (ref 32.0–36.0)
MCV: 83.6 fL — ABNORMAL LOW (ref 84.0–100.0)
MPV: 10.9 fL (ref 7.2–13.2)
Platelets: 195 10*3/uL (ref 140–440)
RBC: 4.7 x10e6/mcL (ref 4.00–5.60)
RDW: 13.8 % (ref 11.0–16.0)
WBC: 8.4 10*3/uL (ref 3.8–10.6)

## 2021-06-24 LAB — COMPREHENSIVE METABOLIC PANEL
ALT: 133 U/L — ABNORMAL HIGH (ref 0–50)
AST: 59 U/L — ABNORMAL HIGH (ref 0–50)
Albumin/Globulin Ratio: 1 (ref 1.00–2.70)
Albumin: 3.8 g/dL (ref 3.5–5.2)
Alk Phosphatase: 182 U/L — ABNORMAL HIGH (ref 40–130)
Anion Gap: 14 mmol/L (ref 2–17)
BUN: 12 mg/dL (ref 6–20)
CO2: 25 mmol/L (ref 22–29)
Calcium: 9.3 mg/dL (ref 8.6–10.0)
Chloride: 97 mmol/L — ABNORMAL LOW (ref 98–107)
Creatinine: 1 mg/dL (ref 0.7–1.3)
Est, Glom Filt Rate: 92 mL/min/1.73m?? (ref >=60)
Globulin: 4.2 g/dL (ref 1.9–4.4)
Glucose: 112 mg/dL — ABNORMAL HIGH (ref 70–99)
Osmolaliy Calculated: 272 mosm/kg (ref 270–287)
Potassium: 3.5 mmol/L (ref 3.5–5.3)
Sodium: 136 mmol/L (ref 135–145)
Total Bilirubin: 0.71 mg/dL (ref 0.00–1.20)
Total Protein: 7.9 g/dL (ref 6.4–8.3)

## 2021-06-24 LAB — TROPONIN
Troponin T: <0.01 ng/mL (ref 0.000–0.010)
Troponin T: <0.01 ng/mL (ref 0.000–0.010)
Troponin T: <0.01 ng/mL (ref 0.000–0.010)

## 2021-06-24 LAB — LACTIC ACID: Lactic Acid: 0.9 mmol/L (ref 0.5–2.0)

## 2021-06-24 LAB — APTT: APTT: 29.7 s (ref 23.3–34.5)

## 2021-06-24 MED ORDER — APIXABAN 5 MG PO TABS
5 MG | Freq: Two times a day (BID) | ORAL | Status: AC
Start: 2021-06-24 — End: 2021-06-29
  Administered 2021-06-24 – 2021-06-26 (×5): 10 mg via ORAL

## 2021-06-24 MED ORDER — ONDANSETRON HCL 4 MG/2ML IJ SOLN
4 MG/2ML | Freq: Once | INTRAMUSCULAR | Status: AC
Start: 2021-06-24 — End: 2021-06-24

## 2021-06-24 MED ORDER — HYDROCODONE-ACETAMINOPHEN 5-325 MG PO TABS
5-325 | Freq: Four times a day (QID) | ORAL | Status: DC | PRN
Start: 2021-06-24 — End: 2021-06-26
  Administered 2021-06-24 – 2021-06-26 (×7): 1 via ORAL

## 2021-06-24 MED ORDER — HYDROMORPHONE HCL 2 MG/ML IJ SOLN
2 MG/ML | INTRAMUSCULAR | Status: AC
Start: 2021-06-24 — End: 2021-06-24
  Administered 2021-06-24: 07:00:00 1 via INTRAVENOUS

## 2021-06-24 MED ORDER — HYDROMORPHONE HCL 2 MG/ML IJ SOLN
2 MG/ML | INTRAMUSCULAR | Status: AC
Start: 2021-06-24 — End: 2021-06-24

## 2021-06-24 MED ORDER — NORMAL SALINE FLUSH 0.9 % IV SOLN
0.9 % | INTRAVENOUS | Status: AC | PRN
Start: 2021-06-24 — End: 2021-06-26

## 2021-06-24 MED ORDER — POTASSIUM BICARB-CITRIC ACID 20 MEQ PO TBEF
20 MEQ | ORAL | Status: AC | PRN
Start: 2021-06-24 — End: 2021-06-26

## 2021-06-24 MED ORDER — ONDANSETRON HCL 4 MG/2ML IJ SOLN
4 MG/2ML | INTRAMUSCULAR | Status: AC
Start: 2021-06-24 — End: 2021-06-24
  Administered 2021-06-24: 06:00:00 4 via INTRAVENOUS

## 2021-06-24 MED ORDER — KETOROLAC TROMETHAMINE 15 MG/ML IJ SOLN
15 | Freq: Three times a day (TID) | INTRAMUSCULAR | Status: DC | PRN
Start: 2021-06-24 — End: 2021-06-26
  Administered 2021-06-24 – 2021-06-26 (×5): 15 mg via INTRAVENOUS

## 2021-06-24 MED ORDER — ONDANSETRON HCL 4 MG/2ML IJ SOLN
4 MG/2ML | Freq: Four times a day (QID) | INTRAMUSCULAR | Status: AC | PRN
Start: 2021-06-24 — End: 2021-06-26

## 2021-06-24 MED ORDER — PANTOPRAZOLE SODIUM 40 MG IV SOLR
40 MG | Freq: Every day | INTRAVENOUS | Status: DC
Start: 2021-06-24 — End: 2021-06-24
  Administered 2021-06-24: 13:00:00 40 mg via INTRAVENOUS

## 2021-06-24 MED ORDER — HEPARIN SOD (PORCINE) IN D5W 100 UNIT/ML IV SOLN
100 UNIT/ML | INTRAVENOUS | Status: DC
Start: 2021-06-24 — End: 2021-06-24
  Administered 2021-06-24: 08:00:00 18 [IU]/kg/h via INTRAVENOUS

## 2021-06-24 MED ORDER — MORPHINE SULFATE 4 MG/ML IV SOLN
4 MG/ML | INTRAVENOUS | Status: AC
Start: 2021-06-24 — End: 2021-06-24
  Administered 2021-06-24: 06:00:00 4 via INTRAVENOUS

## 2021-06-24 MED ORDER — PANTOPRAZOLE SODIUM 40 MG PO TBEC
40 MG | Freq: Every day | ORAL | Status: DC
Start: 2021-06-24 — End: 2021-06-24

## 2021-06-24 MED ORDER — MORPHINE SULFATE (PF) 4 MG/ML IJ SOLN
4 MG/ML | INTRAMUSCULAR | Status: AC | PRN
Start: 2021-06-24 — End: 2021-06-25
  Administered 2021-06-24 – 2021-06-25 (×4): 2 mg via INTRAVENOUS

## 2021-06-24 MED ORDER — HYDROMORPHONE HCL 2 MG/ML IJ SOLN
2 MG/ML | Freq: Once | INTRAMUSCULAR | Status: AC
Start: 2021-06-24 — End: 2021-06-24
  Administered 2021-06-24: 09:00:00 1 mg via INTRAVENOUS

## 2021-06-24 MED ORDER — APIXABAN 5 MG PO TABS
5 MG | Freq: Two times a day (BID) | ORAL | Status: AC
Start: 2021-06-24 — End: 2021-06-26

## 2021-06-24 MED ORDER — SODIUM CHLORIDE 0.9 % IV SOLN
0.9 % | INTRAVENOUS | Status: AC | PRN
Start: 2021-06-24 — End: 2021-06-26

## 2021-06-24 MED ORDER — ACETAMINOPHEN 500 MG PO TABS
500 MG | ORAL | Status: AC
Start: 2021-06-24 — End: 2021-06-24
  Administered 2021-06-24: 06:00:00 1000 mg via ORAL

## 2021-06-24 MED ORDER — POLYETHYLENE GLYCOL 3350 17 G PO PACK
17 g | Freq: Every day | ORAL | Status: AC | PRN
Start: 2021-06-24 — End: 2021-06-26

## 2021-06-24 MED ORDER — ACETAMINOPHEN 325 MG PO TABS
325 | Freq: Four times a day (QID) | ORAL | Status: DC | PRN
Start: 2021-06-24 — End: 2021-06-26

## 2021-06-24 MED ORDER — HEPARIN SODIUM (PORCINE) 1000 UNIT/ML IJ SOLN
1000 UNIT/ML | INTRAMUSCULAR | Status: DC | PRN
Start: 2021-06-24 — End: 2021-06-24

## 2021-06-24 MED ORDER — APIXABAN 5 MG PO TABS
5 MG | Freq: Once | ORAL | Status: AC
Start: 2021-06-24 — End: 2021-06-24
  Administered 2021-06-24: 06:00:00 5 mg via ORAL

## 2021-06-24 MED ORDER — IOPAMIDOL 76 % IV SOLN
76 % | Freq: Once | INTRAVENOUS | Status: AC | PRN
Start: 2021-06-24 — End: 2021-06-24
  Administered 2021-06-24: 07:00:00 100 mL via INTRAVENOUS

## 2021-06-24 MED ORDER — ONDANSETRON 4 MG PO TBDP
4 MG | Freq: Three times a day (TID) | ORAL | Status: AC | PRN
Start: 2021-06-24 — End: 2021-06-26

## 2021-06-24 MED ORDER — HEPARIN SOD (PORCINE) IN D5W 100 UNIT/ML IV SOLN
100 UNIT/ML | INTRAVENOUS | Status: DC
Start: 2021-06-24 — End: 2021-06-24

## 2021-06-24 MED ORDER — POTASSIUM CHLORIDE 10 MEQ/100ML IV SOLN
10 MEQ/0ML | INTRAVENOUS | Status: AC | PRN
Start: 2021-06-24 — End: 2021-06-26

## 2021-06-24 MED ORDER — DULOXETINE HCL 30 MG PO CPEP
30 MG | Freq: Every day | ORAL | Status: AC
Start: 2021-06-24 — End: 2021-06-26
  Administered 2021-06-24 – 2021-06-26 (×3): 60 mg via ORAL

## 2021-06-24 MED ORDER — MAGNESIUM SULFATE 2 GM/50ML IV SOLN
2 GM/50ML | INTRAVENOUS | Status: AC | PRN
Start: 2021-06-24 — End: 2021-06-26

## 2021-06-24 MED ORDER — NORMAL SALINE FLUSH 0.9 % IV SOLN
0.9 % | Freq: Two times a day (BID) | INTRAVENOUS | Status: AC
Start: 2021-06-24 — End: 2021-06-26
  Administered 2021-06-24 – 2021-06-26 (×5): 10 mL via INTRAVENOUS

## 2021-06-24 MED ORDER — PANTOPRAZOLE SODIUM 40 MG PO TBEC
40 MG | Freq: Every day | ORAL | Status: AC
Start: 2021-06-24 — End: 2021-06-26
  Administered 2021-06-25 – 2021-06-26 (×2): 40 mg via ORAL

## 2021-06-24 MED ORDER — MORPHINE SULFATE 4 MG/ML IV SOLN
4 MG/ML | INTRAVENOUS | Status: AC
Start: 2021-06-24 — End: 2021-06-24

## 2021-06-24 MED ORDER — ACETAMINOPHEN 650 MG RE SUPP
650 | Freq: Four times a day (QID) | RECTAL | Status: DC | PRN
Start: 2021-06-24 — End: 2021-06-26

## 2021-06-24 MED ORDER — HEPARIN SODIUM (PORCINE) 1000 UNIT/ML IJ SOLN
1000 UNIT/ML | Freq: Once | INTRAMUSCULAR | Status: AC
Start: 2021-06-24 — End: 2021-06-24
  Administered 2021-06-24: 08:00:00 7660 [IU]/kg via INTRAVENOUS

## 2021-06-24 MED ORDER — POTASSIUM CHLORIDE CRYS ER 20 MEQ PO TBCR
20 MEQ | ORAL | Status: AC | PRN
Start: 2021-06-24 — End: 2021-06-26
  Administered 2021-06-25: 10:00:00 40 meq via ORAL

## 2021-06-24 MED FILL — NORMAL SALINE FLUSH 0.9 % IV SOLN: 0.9 % | INTRAVENOUS | Qty: 30

## 2021-06-24 MED FILL — MORPHINE SULFATE 4 MG/ML IJ SOLN: 4 mg/mL | INTRAMUSCULAR | Qty: 1

## 2021-06-24 MED FILL — ONDANSETRON 4 MG PO TBDP: 4 MG | ORAL | Qty: 1

## 2021-06-24 MED FILL — KETOROLAC TROMETHAMINE 15 MG/ML IJ SOLN: 15 MG/ML | INTRAMUSCULAR | Qty: 1

## 2021-06-24 MED FILL — ONDANSETRON HCL 4 MG/2ML IJ SOLN: 4 MG/2ML | INTRAMUSCULAR | Qty: 2

## 2021-06-24 MED FILL — PROTONIX 40 MG IV SOLR: 40 MG | INTRAVENOUS | Qty: 40

## 2021-06-24 MED FILL — ELIQUIS 5 MG PO TABS: 5 MG | ORAL | Qty: 1

## 2021-06-24 MED FILL — DULOXETINE HCL 30 MG PO CPEP: 30 MG | ORAL | Qty: 2

## 2021-06-24 MED FILL — HYDROCODONE-ACETAMINOPHEN 5-325 MG PO TABS: 5-325 MG | ORAL | Qty: 1

## 2021-06-24 MED FILL — ELIQUIS 5 MG PO TABS: 5 MG | ORAL | Qty: 2

## 2021-06-24 MED FILL — ACETAMINOPHEN EXTRA STRENGTH 500 MG PO TABS: 500 MG | ORAL | Qty: 2

## 2021-06-24 MED FILL — HYDROMORPHONE HCL 2 MG/ML IJ SOLN: 2 MG/ML | INTRAMUSCULAR | Qty: 1

## 2021-06-24 MED FILL — HEPARIN SODIUM (PORCINE) 1000 UNIT/ML IJ SOLN: 1000 UNIT/ML | INTRAMUSCULAR | Qty: 10

## 2021-06-24 MED FILL — MORPHINE SULFATE 4 MG/ML IV SOLN: 4 mg/mL | INTRAVENOUS | Qty: 1

## 2021-06-24 MED FILL — HEPARIN SOD (PORCINE) IN D5W 100 UNIT/ML IV SOLN: 100 UNIT/ML | INTRAVENOUS | Qty: 250

## 2021-06-24 NOTE — H&P (Signed)
[facility] Hospitalist Service     Hospitalist H & P     Name: Ronald Herrera   DOB: 09/25/1970 (Age: 51 y.o.)   Date of Admission: 06/24/21    Primary Care Provider: None None  Attending: Carron Curie, MD    Chief Complaint: Dyspnea and pleurisy      History of Present Illness  Ronald Herrera is a 51 y.o. male with a PMHx significant for HTN, active DVT/PE (provoked, s/p recent ankle surgery and resultant immobility) -- who presented to the ED with complaints of progressive dyspnea and chest pain (with inspiration). Acute saddle embolus was previously noted in the ER (5/6), and the patient was admitted to [facility] for consideration of thrombectomy. Given that he remained hemodynamically stable, decision was made to empirically anticoagulate. Patient was discharged 5/8 with a 3-6 month course of eliquis. Despite compliance with his anticoagulation, patient noted severe, progressive "heavy" chest pain for several hours prior to his arrival this morning. He states that speech has become difficulty due to the nature of his pleurisy. He denies chills, nausea, or emesis.     On arrival to the [facility]) ER, patient was not noted to be hypoxic, hypotensive, nor tachycardic. Chemical and hematologic workup largely unrevealing. CTA chest was again performed, and though there was no (noted) difference in clot burden, it appeared that basilar pulmonary infarcts had developed since the prior imaging study. Case was initially discussed with the intensivist [place] for transfer, given the persistent of right heart strain (on imaging) and his symptoms. Dr. Sandi Mariscal stated that the patient would not be a candidate for thrombolysis or thrombectomy given the timeline of disease and the presence of acute infarcts. [facility] given lack of available beds elsewhere.     On [facility] to [facility], patient did appear slightly uncomfortable. Seen lying still, speaking softly. He reports that the pain was  also noted on the prior admission though it was not particularly severe, waxing and waning. He felt that it had initially improved upon discharge. The pain then slowly returned and worsened (without any particular activity or incident). He continues to report intermittent fevers; was noted to have a low grade temperature on the last hospitalization as well, though an infectious component was (and continues to be) unlikely. No hemoptysis. He confirms compliance with his eliquis.      Past Medical History     Past Medical History:   Diagnosis Date    Anxiety     Arthritis     LEFT ANKLE    BMI 32.0-32.9,adult     COVID 2022    Depression     Fibromyalgia     Generalized headache     GERD (gastroesophageal reflux disease)     Herpes     Hiatal hernia     Hyperlipidemia     Hypertension     Left ankle instability     Seasonal allergies     Unstable ankle, left     Wears glasses         Past Surgical History     Past Surgical History:   Procedure Laterality Date    ANKLE SURGERY Left 06/28/2019    left ankle partial resection tibia, partial resection fibula, brostrom gould lateral ankle ligament reconstruction, left ankle mosaicplasty with aosteochondral plug transfer from ipsilateral knee-dr. blake ohlson    ANKLE SURGERY Left 04/30/2021    LEFT TIBIOTALAR ARTHRODESIS WITH ASPIRATION PROXIMAL TIBIA, + Left ankle arthroscopy performed by  Thurman Coyer, MD at [facility]    ENDOSCOPY, COLON, DIAGNOSTIC  04/09/2021    FOOT SURGERY Left     IP fusion 12/2017    TOE SURGERY  2019    Left foot IP [facility] [place]        Allergies   Bee pollen, Grass pollen([id number] vern), Pollen extract, Bromfenac, and Nsaids       Medications     Current Outpatient Medications   Medication Instructions    acetaminophen (TYLENOL) 1,000 mg, Oral, 2 TIMES DAILY    acyclovir (ZOVIRAX) 400 MG tablet 1 tablet, Oral, 2 TIMES DAILY    apixaban (ELIQUIS) 5 MG TABS tablet Take 2 tablets by mouth 2 times daily for 7  days, THEN 1 tablet 2 times daily for 24 days.    [START ON 07/20/2021] apixaban (ELIQUIS) 5 mg, Oral, 2 TIMES DAILY    atorvastatin (LIPITOR) 40 mg, Oral, DAILY    cetirizine (ZYRTEC) 10 mg, Oral, EVERY MORNING    cyproheptadine (PERIACTIN) 8 mg, Oral, NIGHTLY    DULoxetine (CYMBALTA) 60 mg, Oral, DAILY    ergocalciferol (ERGOCALCIFEROL) 50,000 Units, Oral, WEEKLY, (Monday)    gabapentin (NEURONTIN) 600 mg, Oral, 3 TIMES DAILY    hydroCHLOROthiazide (HYDRODIURIL) 25 mg, Oral, EVERY MORNING    losartan (COZAAR) 100 MG tablet Take 1 tablet by mouth daily    Multiple Vitamins-Minerals (THERAPEUTIC MULTIVITAMIN-MINERALS) tablet 1 tablet, Oral, DAILY    naloxone (NARCAN) 4 MG/0.1ML LIQD nasal spray 1 spray, Nasal, PRN    olopatadine (PATANOL) 0.1 % ophthalmic solution 1 drop, Both Eyes, DAILY PRN    oxyCODONE (ROXICODONE) 5 mg, Oral, EVERY 6 HOURS PRN    pantoprazole (PROTONIX) 40 mg, Oral, EVERY MORNING    sucralfate (CARAFATE) 1 g, Oral, 3 TIMES DAILY    tadalafil (CIALIS) 20 mg, Oral, PRN    traZODone (DESYREL) 150 mg, Oral, NIGHTLY    vitamin C 250 mg, Oral, DAILY         Family History     Family History   Problem Relation Age of Onset    Diabetes Mother         Social History      Social History     Socioeconomic History    Marital status: Married     Spouse name: Not on file    Number of children: Not on file    Years of education: Not on file    Highest education level: Not on file   Occupational History    Not on file   Tobacco Use    Smoking status: Former     Packs/day: 1.00     Years: 15.00     Pack years: 15.00     Types: Cigarettes     Start date: 02/16/1991     Quit date: 12/02/2020     Years since quitting: 0.5    Smokeless tobacco: Never   Vaping Use    Vaping Use: Not on file   Substance and Sexual Activity    Alcohol use: Yes     Alcohol/week: 4.0 standard drinks     Types: 4 Cans of beer per week     Comment: 15 DRINKS PER WEEK    Drug use: Yes     Frequency: 3.0 times per week     Types: Marijuana  Sherrie Mustache)    Sexual activity: Yes     Partners: Female   Other Topics Concern  Not on file   Social History Narrative    ** Merged History Encounter **          Social Determinants of Health     Financial Resource Strain: Not on file   Food Insecurity: Not on file   Transportation Needs: Not on file   Physical Activity: Not on file   Stress: Not on file   Social Connections: Not on file   Intimate Partner Violence: Not on file   Housing Stability: Not on file        Review of Systems (positives bolded, otherwise negative)     10 point ROS reviewed, and negative -- unless otherwise stated in the HPI (as above), or assessment (as below)     Physical Exam     General: Awake, alert, cooperative. No acute distress  Head: Normocephalic, atraumatic   Ophthalmic: Anicteric sclera, no subconjunctival pallor   Cardiologic: Regular rate, regular rhythm, no murmurs   Pulmonary: Clear to auscultation bilaterally, normal respiratory effort   Gastrointestinal: Soft, non-tender, no distension. Normoactive bowel sounds  Vascular: Appropriate warmth and color of extremities. No LE edema noted  Musculoskeletal: Appropriate tone, without restriction in movements    Skin: No rashes or suspicious lesions observed; no localized erythema   Neurologic: No focal weakness, no slurred speech, no facial droop  Psychiatric: Appropriate mood and affect       Labs and Imaging     Labs:   Hematologic/Coags Chemistries   Recent Labs     06/22/21  0515 06/24/21  0129   WBC 7.5 8.4   HGB 12.4* 12.5*   HCT 39.8 39.3   PLT 173 195     Lab Results   Component Value Date/Time    PROT 7.9 06/24/2021 01:29 AM    ALBUMIN 1.00 06/24/2021 01:29 AM     No components found for: HGBA1C  No results found for: INR, PROTIME  Lab Results   Component Value Date/Time    APTT 29.7 06/24/2021 04:06 AM     Lab Results   Component Value Date/Time    DDIMER 11.33 06/20/2021 11:04 AM      Recent Labs     06/22/21  0515 06/24/21  0129   NA 138 136   K 3.6 3.5   CL 102 97*    CO2 25 25   BUN 15 12   CREATININE 1.3 1.0   ALBUMIN  --  1.00   BILITOT  --  0.71   ALKPHOS  --  182*   AST  --  59*   ALT  --  133*     No results for input(s): GLU in the last 72 hours.  No results found for: CPK, CKMB, TROPONINI  No results found for: IRON, FERRITIN     Inflammatory/Respiratory Diabetes   No results found for: CRP  No results found for: ESR  ABGs:  No results found for: PHART, PO2ART, HCO3, PCO2ART   Lab Results   Component Value Date/Time    CREATININE 1.0 06/24/2021 01:29 AM              TRANSTHORACIC ECHOCARDIOGRAM (TTE) COMPLETE (CONTRAST/BUBBLE/3D PRN) 06/20/2021  6:53 PM (Final)  Interpretation Summary    Left Ventricle: Normal left ventricular systolic function with a visually estimated EF of 70 - 75%. Left ventricle is smaller than normal. Mildly increased wall thickness. Findings consistent with mild concentric hypertrophy. Normal wall motion.    Tricuspid Valve: Mildly elevated RVSP. The estimated RVSP is  39 mmHg.    IVC/SVC: IVC diameter is less than or equal to 21 mm and decreases greater than 50% during inspiration; therefore the estimated right atrial pressure is normal (~3 mmHg). IVC size is normal.     Assessment & Plan     # Saddle pulmonary embolism   # Acute pulmonary infarcts   # Femoral vein DVT   Patient initially presented with LLE edema and tenderness (s/p recent ankle fusion); found to have D-dimer of 11 and left femoral vein DVT   CTA chest (5/10): extensive bilateral PE, with saddle embolus at level of left and right pulmonary artery bifurcation. Mild right heart strain as evidence of interventricular septum flattening. Mild bilateral posterior lower lung atelectasis (OR developing pulmonary infarcts).   Patient remains hemodynamically stable, without tachycardia, hypotension, nor hypoxia   ANA ordered on prior admission; negative. Suspect that this is provoked.   -Will admit for heparin GTT; monitor in stepdown unit   -Morphine 65m IV Q4H as needed for acute pain    -Avoiding volume resuscitation to minimize further ventricular strain   -Pending clinical trajectory, consider repeating echocardiogram and CT chest to ensure stability of clot burden and ischemia   -Suspect that patient may be continued on eliquis on discharge, given that this may reflect evolution of disease rather than failure of treatment. Will consider cardiology consult, though patient will have pulmonology evaluation while being monitored in the stepdown unit at this facility     # Essential hypertension   Appears to be prescribed losartan; though not recently dispensed   Currently normotensive   -Will hold outpatient antihypertensives given tenuous hemodynamic status     # Coronary calcifications  CT angiogram demonstrating left anterior descending calcifications.    EKG currently without signs of ischemic pathology   -Patient instructed to follow-up with cardiology as an outpatient for ischemic testing .    # Pulmonary nodule   457mnon-calcified nodule in the right middle lobe; incidentally noted on CTA chest  -Outpatient surveillance with PCP     CODE STATUS: FULL CODE   DVT prophylaxis: Heparin GTT   GI prophylaxis: Pantoprazole   Diet: Cardiac   Disposition: Admit for inpatient management     ChCarron CurieMD  06/24/2021 5:26 AM  [facility]    This note reflects my/our clinical thought processes and is intended primarily for the exchange of information between healthcare providers.  It is not written primarily to communicate with the patient or family directly, which takes place in person at the bedside.  This note may contain sensitive information, including substance use, mental health, and consideration of sensitive, serious, or other "do not miss" diagnoses, which is further discussed at the bedside.

## 2021-06-24 NOTE — Progress Notes (Signed)
..Hospitalist Daily Progress Note      Subjective:   Pt seen and examined.     Down status in ICU.  Can change to MedSurg but keep in ICU in light of potential for deterioration.  No real complaints except chest discomfort midsternal.  Worse with deep inspiration.    Objective:      Vitals:    06/24/21 0830   BP: (!) 149/92   Pulse: 95   Resp: 18   Temp:    SpO2: 92%      Physical Exam:  General: NAD, AAOX3  Sclera  Neck is supple  Lungs are clear to auscultation  Heart normal S1-S2 without murmur rub or gallop  Abdomen soft nontender  No cyanosis sinus, clubbing, edema  Edema left lower extremity with wound dressed.  Will let orthopedic team know of patient's admission.  MSK: Moves all 4 extremities spontaneously, normal range of motion  Psych: normal mood and affect        Imaging:  Xray Result (most recent):  XR CHEST PORTABLE 06/21/2021    Narrative  Chest AP: 06/21/21    INDICATION: "shortness of breath, hx of PE".    COMPARISON: CT from the day prior.    FINDINGS: Diminished lung volumes. Suspect atelectasis of the lung bases. Right  hemidiaphragm is mildly elevated. Heart size is grossly within normal limits for  this portable AP technique. No pleural effusion or pneumothorax.    Impression  Diminished lung volumes with probable atelectasis.     06/20/21    TRANSTHORACIC ECHOCARDIOGRAM (TTE) COMPLETE (CONTRAST/BUBBLE/3D PRN) 06/20/2021  6:53 PM (Final)    Interpretation Summary    Left Ventricle: Normal left ventricular systolic function with a visually estimated EF of 70 - 75%. Left ventricle is smaller than normal. Mildly increased wall thickness. Findings consistent with mild concentric hypertrophy. Normal wall motion.    Tricuspid Valve: Mildly elevated RVSP. The estimated RVSP is 39 mmHg.    IVC/SVC: IVC diameter is less than or equal to 21 mm and decreases greater than 50% during inspiration; therefore the estimated right atrial pressure is normal (~3 mmHg). IVC size is normal.    Signed by: Adah Perl, MD on 06/20/2021  6:53 PM     CT Result (most recent):  CTA CHEST W WO CONTRAST 06/24/2021    Narrative  CTA chest: 06/24/21    INDICATION: PE shortness of breath, chest pain    COMPARISON: CTA 06/20/2021    TECHNIQUE: PE protocol (Postcontrast imaging from the thoracic inlet through the  hemidiaphragms in the pulmonary arterial phase. Axial 5x5 mm soft tissue and  lung 2x2 mm images. Multiplanar 3-D volumetric MIP reconstructions through the  pulmonary arteries per protocol.) CT scanning was performed using radiation dose  reduction techniques when appropriate, per system protocols.    Pulmonary arteries: Subtle pulmonary embolus involving the right and left main  pulmonary arteries. There is clot extension into lobar, segmental and  subsegmental branches bilaterally. Overall, the clot burden appears similar to  prior CT.    Lungs/Airways: There are small subpleural consolidations in the basal lower  lobes bilaterally, which are new from prior exam. 6 mm nodule in the right  middle lobe.    Pleura: No pleural effusion or pneumothorax.    Lymph Nodes: No mediastinal, hilar, or axillary adenopathy.    Cardiovascular: Normal heart size. No pericardial effusion. RV:LV ratio is less  than 1. There is very mild flattening of the interventricular septum.    Osseous  structures: No suspicious lytic or blastic osseous lesions.    Upper Abdomen: Unremarkable.    Impression  Saddle pulmonary embolus extending into the right and left main pulmonary  arteries with clot extension into the lobar, segmental and subsegmental branches  bilaterally. Overall, the clot burden is similar in appearance to CT from  06/20/2021.    Mild flattening of the interventricular septum, which could represent right  heart strain. RV to LV ratio is less than 1.    Interval development of small pulmonary infarcts in the lower lobes.    6 mm right middle lobe nodule, which appears slightly smaller when compared to  recent prior CT, and could be  inflammatory or infectious.     MRI Result (most recent):  MRI ANKLE LEFT WO CONTRAST 04/24/2019    Narrative  EXAM: MRI ANKLE LT WO CONT    INDICATION: Left ankle pain and possible osteochondral lesion.    COMPARISON: None.    TECHNIQUE: Axial T2 and proton density fat-saturated; coronal T2 fat-saturated;  sagittal T1, T2 fat-saturated, and spoiled gradient-echo MRI of the left ankle .    CONTRAST: None.    FINDINGS: Bone Marrow: Heterogeneous cystic degenerative signal in the lateral  talar dome measures 15 x 7 x 10 mm. No acute fracture or osteonecrosis.    Joint fluid: Physiologic. Ganglion cyst arises from the anterior margin of the  posterior subtalar joint and courses along the lateral aspect of the talar neck.  Volume measures 2.2 x 1.2 x 1.1 cm.    Tendons: intact.    Muscles: Within normal limits.    Lateral ligaments: intact.    Deltoid and spring ligaments: intact.    Sinus tarsi: Edema partially replaces the fat.    Plantar fascia: Within normal limits.    Articular cartilage: Heterogeneous chondral loss from the far lateral aspect of  the talar dome is at least shallow partial-thickness. Minimal talonavicular  osteoarthritis. No evidence of coalition.    Soft tissue mass: None. Presumed ganglion cyst as described above in the joint  fluid section.    Impression  1. 15 x 7 x 10 mm cystic osteochondral lesion in the lateral talar dome.  2. 22 mm ganglion cyst arises from the posterior subtalar joint and courses  medial to the talar neck. Adjacent sinus tarsi inflammation.  3. Intact tendons and ligaments.       Lab Review   Recent Labs     06/24/21  0129 06/22/21  0515 06/21/21  0446   WBC 8.4 7.5 7.6   HGB 12.5* 12.4* 12.7*   HCT 39.3 39.8 41.1   MCV 83.6* 83.6* 84.6   PLT 195 173 180      Lab Results   Component Value Date    NA 136 06/24/2021    K 3.5 06/24/2021    CL 97 (L) 06/24/2021    CO2 25 06/24/2021    BUN 12 06/24/2021    CREATININE 1.0 06/24/2021    GLUCOSE 112 (H) 06/24/2021    CALCIUM 9.3  06/24/2021    PROT 7.9 06/24/2021    LABALBU 3.8 06/24/2021    BILITOT 0.71 06/24/2021    ALKPHOS 182 (H) 06/24/2021    AST 59 (H) 06/24/2021    ALT 133 (H) 06/24/2021    LABGLOM 92 06/24/2021    GFRAA 103 05/14/2019    GLOB 4.2 06/24/2021     Lab Results   Component Value Date/Time    MG 2.0 06/21/2021 04:46 AM  No results found for: CKTOTAL, CKMB, CKMBINDEX, TROPONINI         Meds:  Scheduled Meds:   sodium chloride flush  5-40 mL IntraVENous 2 times per day    [START ON 06/25/2021] pantoprazole  40 mg Oral QAM AC    DULoxetine  60 mg Oral Daily    apixaban  10 mg Oral BID    Followed by    Melene Muller[START ON 07/01/2021] apixaban  5 mg Oral BID     Continuous Infusions:   sodium chloride       PRN Meds:sodium chloride flush, sodium chloride, ondansetron **OR** ondansetron, polyethylene glycol, acetaminophen **OR** acetaminophen, potassium chloride **OR** potassium alternative oral replacement **OR** potassium chloride, magnesium sulfate, morphine, ketorolac, HYDROcodone 5 mg - acetaminophen    Assessment:   [age] y.o. 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/15/2020    Cellulitis and abscess of face 09/15/2020    Chalazion left upper eyelid 09/15/2020    Chronic post-traumatic stress disorder (PTSD) 09/15/2020    Posttraumatic stress disorder 09/15/2020    Post-traumatic stress disorder, chronic 09/15/2020    Constipation 09/15/2020    Fracture of phalanx of toe 09/15/2020    Herpesviral infection of other male genital organs 09/15/2020    Homicidal thoughts 09/15/2020    Benign essential hypertension 09/15/2020    Essential (primary) hypertension 09/15/2020    Hypertension 09/15/2020    Primary hypertension 09/15/2020     Pain in left ankle and joints of left foot 09/15/2020    Joint pain 09/15/2020    Myalgia and myositis 09/15/2020    Arthralgia of hand 09/15/2020    Nicotine dependence 09/15/2020    Painful spasm of anus 09/15/2020    Flat foot (pes planus) (acquired), unspecified foot 09/15/2020    Pes planus 09/15/2020    Reason for consultation 09/15/2020    Recurrent genital herpes simplex 09/15/2020    Secondary osteoarthritis, left ankle and foot 09/15/2020    Nicotine dependence, other tobacco product, in remission 09/15/2020    Tobacco dependence in remission 09/15/2020    Tobacco use disorder 09/15/2020    Vitamin D deficiency 09/15/2020    Primary osteoarthritis, 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    Pain in left knee 09/15/2020    Peptic ulcer disease with hemorrhage 09/15/2020    PUD (peptic ulcer disease) 09/10/2020    Patellofemoral syndrome of left knee 09/10/2020    Patellar tendinitis of left knee 09/10/2020    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 09/10/2020    Left foot pain 09/10/2020    Instability of left ankle joint 09/10/2020    Impingement syndrome, shoulder, left 09/10/2020    Fibromyalgia 09/10/2020    Arthritis of neck 09/10/2020    Ganglion cyst 05/10/2019    Osteochondral lesion of talar dome 05/10/2019    Primary osteoarthritis of both feet 04/17/2019    Chronic ankle pain 01/24/2019    Pulmonary embolism, other, unspecified chronicity, unspecified whether acute cor pulmonale present (HCC) 06/24/2021    Pulmonary embolism and infarction (HCC) 06/20/2021    Ganglion cyst 05/10/2019    Osteochondral lesion of talar dome 05/10/2019  Primary osteoarthritis of both feet 04/17/2019    Chronic pain of left ankle 01/24/2019    Chronic pain of right ankle 01/24/2019       Plan:   # Saddle pulmonary embolism   # Acute pulmonary infarcts   # Femoral vein DVT   Admitted on May 10.    Patient was discharged from [facility] on May 8.    Patient initially presented with LLE edema and tenderness  (s/p recent ankle fusion); found to have D-dimer of 11 and left femoral vein DVT   Was on heparin drip at [facility] and converted to Eliquis.  No intervention and the patient was hemodynamically stable with negative troponins and normal BNP  CTA chest (5/10): extensive bilateral PE, with saddle embolus at level of left and right pulmonary artery bifurcation.  Mild right heart strain as evidence of interventricular septum flattening.   Mild bilateral posterior lower lung atelectasis (OR developing pulmonary infarcts).   Patient remains hemodynamically stable, without tachycardia, hypotension, nor hypoxia   ANA ordered on prior admission; negative.   Severe provoked deep venous thrombosis with pulmonary emboli secondary to ankle surgery on April 30, 2021  Patient was on aspirin 325 mg p.o. daily for DVT prophylaxis prior to admission     Discussed with pulmonary/critical care.  We will discontinue heparin drip.  Start patient on full dose Eliquis 10 mg p.o. twice daily.  Ambulate about.  Continue to monitor troponin and BNP.  As needed morphine.  Will use low-dose Toradol IV initially for pain management.  Will discharge in 1 to 2 days.  Will need home health with home PT and OT.     # Essential hypertension   Appears to be prescribed losartan;   though not recently dispensed   Currently normotensive   -Will hold outpatient antihypertensives given tenuous hemodynamic status      # Coronary calcifications  CT angiogram demonstrating left anterior descending calcifications.    EKG  without signs of ischemic pathology   -Patient instructed to follow-up with cardiology as an outpatient for ischemic testing .     # Pulmonary nodule   66mm non-calcified nodule in the right middle lobe; incidentally noted on CTA chest  -Outpatient surveillance with PCP      #Status post left ankle surgery on April 30, 2021/Dr. Lamb   s/p left ankle arthroscopy, microfracture OCD lesion, lateral ligament reconstruction, peroneal tendon  debridement, superficial peroneal nerve neurolysis  Will let surgical team know about patient's readmission/admission.    DVT prophylaxis: Anticoagulation with full dose Eliquis   Dispo: Is to go home.  Code status: Full Code   Medical Decision maker: Self and wife  PCP: None None     This note was created using voice recognition software and may contain typographic errors missed during final review. The intent is to have a complete and accurate medical record.  As a valued partner in this safety effort, if you have noted factual errors, please complete the Health Information Amendment/Correct Form or call the [facility] Health Information Management Office at [phone].    For any questions, please contact Team B

## 2021-06-24 NOTE — Consults (Signed)
Consult Note            Date:06/24/2021        Patient Name:Ronald Herrera     Date of Birth:03/04/1970     Age:51 y.o.    Consults    Chief Complaint   Chest pain      History Obtained From   patient, electronic medical record    History of Present Illness   Mr. Zweber is a 51 year old male with a past medical history of hypertension, DVT and PE, GERD, hyperlipidemia, anxiety, and fibromyalgia.  He was just recently discharged from [facility] on 06/20/2021.  Stay he was initially admitted to the ICU for saddle pulmonary embolism.  His hemodynamics were stable and he had an echocardiogram that did not show any RV strain and normal EF.  Thrombectomy was not performed due to his stability and normal echo.  He was eventually transferred out to the floor and transition from heparin to Eliquis.  His lower extremity Doppler from that stay showed an acute occlusive DVT in the left femoral vein.  He said when he got home he took his Eliquis as directed but has continued to have some low-grade fevers and pain when breathing deep.  He says that last night at 8 PM the pain became constant and more intense.  Says the pain has been in his left shoulder, right lower lobe and mid chest.  Work-up in the ER included a repeat CTA that showed unchanged saddle pulmonary embolism but did reveal bibasilar pulm infarcts versus atelectasis.  He was transferred down to [facility] ICU due to bed availability.  During his stay here in [facility] he has been hemodynamically stable on room air. He complains of mid chest, right lower lobe and left shoulder pain. He says he is short of breath at times but very painful when he takes a deep breath.  He says he has been compliant with his Eliquis that he was discharged on.    Past Medical History     Past Medical History:   Diagnosis Date    Anxiety     Arthritis     LEFT ANKLE    BMI 32.0-32.9,adult     COVID 2022    Depression     Fibromyalgia     Generalized headache     GERD  (gastroesophageal reflux disease)     Herpes     Hiatal hernia     Hyperlipidemia     Hypertension     Left ankle instability     Seasonal allergies     Unstable ankle, left     Wears glasses         Past Surgical History     Past Surgical History:   Procedure Laterality Date    ANKLE SURGERY Left 06/28/2019    left ankle partial resection tibia, partial resection fibula, brostrom gould lateral ankle ligament reconstruction, left ankle mosaicplasty with aosteochondral plug transfer from ipsilateral knee-dr. blake ohlson    ANKLE SURGERY Left 04/30/2021    LEFT TIBIOTALAR ARTHRODESIS WITH ASPIRATION PROXIMAL TIBIA, + Left ankle arthroscopy performed by Baxter Hire, MD at [facility] OR    COLONOSCOPY      ENDOSCOPY, COLON, DIAGNOSTIC  04/09/2021    FOOT SURGERY Left     IP fusion 12/2017    TOE SURGERY  2019    Left foot [facility] [place]        Medications  Prior to Admission medications    Medication Sig Start Date End Date Taking? Authorizing Provider   DULoxetine (CYMBALTA) 60 MG extended release capsule Take 1 capsule by mouth daily    Historical Provider, MD   tadalafil (CIALIS) 20 MG tablet Take 1 tablet by mouth as needed for Erectile Dysfunction    Historical Provider, MD   gabapentin (NEURONTIN) 300 MG capsule Take 2 capsules by mouth 3 times daily for 30 days. 06/22/21 07/22/21  Tyrone Schimke, MD   apixaban (ELIQUIS) 5 MG TABS tablet Take 2 tablets by mouth 2 times daily for 7 days, THEN 1 tablet 2 times daily for 24 days. 06/22/21 07/23/21  Tyrone Schimke, MD   apixaban Everlene Balls) 5 MG TABS tablet Take 1 tablet by mouth 2 times daily 07/20/21   Tyrone Schimke, MD   acyclovir (ZOVIRAX) 400 MG tablet Take 1 tablet by mouth 2 times daily 02/25/21   Historical Provider, MD   acetaminophen (TYLENOL) 500 MG tablet Take 2 tablets by mouth 2 times daily 02/25/21   Historical Provider, MD   atorvastatin (LIPITOR) 40 MG tablet Take 1 tablet by mouth daily 02/10/21   Historical  Provider, MD   cyproheptadine (PERIACTIN) 4 MG tablet Take 2 tablets by mouth nightly 05/15/21   Historical Provider, MD   oxyCODONE (ROXICODONE) 5 MG immediate release tablet Take 1 tablet by mouth every 6 hours as needed for Pain. 06/08/21   Historical Provider, MD   Multiple Vitamins-Minerals (THERAPEUTIC MULTIVITAMIN-MINERALS) tablet Take 1 tablet by mouth daily    Historical Provider, MD   Ascorbic Acid (VITAMIN C) 250 MG tablet Take 1 tablet by mouth daily    Historical Provider, MD   naloxone [facility]) 4 MG/0.1ML LIQD nasal spray 1 spray by Nasal route as needed for Opioid Reversal 05/18/21   Audie Box, PA   pantoprazole (PROTONIX) 40 MG tablet Take 1 tablet by mouth every morning 09/18/20   Historical Provider, MD   sucralfate (CARAFATE) 1 GM tablet Take 1 tablet by mouth 3 times daily 11/11/20   Historical Provider, MD   cetirizine (ZYRTEC) 10 MG tablet Take 1 tablet by mouth every morning 06/10/20   Historical Provider, MD   ergocalciferol (ERGOCALCIFEROL) 1.25 MG (50000 UT) capsule Take 1 capsule by mouth once a week (Monday) 03/04/20 06/20/21  Historical Provider, MD   hydroCHLOROthiazide (HYDRODIURIL) 25 MG tablet Take 1 tablet by mouth every morning 09/08/20   Historical Provider, MD   losartan (COZAAR) 100 MG tablet Take 1 tablet by mouth daily 11/23/19 06/20/21  Historical Provider, MD   olopatadine (PATANOL) 0.1 % ophthalmic solution Place 1 drop into both eyes daily as needed for Allergies 09/08/20   Historical Provider, MD   traZODone (DESYREL) 100 MG tablet Take 1.5 tablets by mouth nightly 06/10/20 07/03/21  Historical Provider, MD        sodium chloride flush 0.9 % injection 5-40 mL, 2 times per day  sodium chloride flush 0.9 % injection 5-40 mL, PRN  0.9 % sodium chloride infusion, PRN  ondansetron (ZOFRAN-ODT) disintegrating tablet 4 mg, Q8H PRN   Or  ondansetron (ZOFRAN) injection 4 mg, Q6H PRN  polyethylene glycol (GLYCOLAX) packet 17 g, Daily PRN  acetaminophen (TYLENOL) tablet 650 mg, Q6H PRN    Or  acetaminophen (TYLENOL) suppository 650 mg, Q6H PRN  potassium chloride (KLOR-CON M) extended release tablet 40 mEq, PRN   Or  potassium bicarb-citric acid (EFFER-K) effervescent tablet 40 mEq, PRN   Or  potassium chloride  10 mEq/100 mL IVPB (Peripheral Line), PRN  magnesium sulfate 2000 mg in water 50 mL IVPB, PRN  heparin (porcine) PF injection 7,660 Units, PRN  heparin (porcine) PF injection 3,830 Units, PRN  morphine sulfate (PF) injection 2 mg, Q4H PRN  pantoprazole (PROTONIX) injection 40 mg, Daily  heparin 25,000 units in dextrose 5% 250 mL (premix) infusion, Continuous        Allergies   Bee pollen, Grass pollen([id number]-o-[id number]-swt vern), Pollen extract, Bromfenac, and Nsaids    Social History     Social History       Tobacco History       Smoking Status  Former Smoking Start Date  02/16/1991 Quit Date  12/02/2020 Smoking Frequency  1 pack/day for 15.00 years (15.00 pk-yrs)    Smoking Tobacco Type  Cigarettes from 02/16/1991 to 12/02/2020      Smokeless Tobacco Use  Never              Alcohol History       Alcohol Use Status  Yes Drinks/Week  0 Glasses of wine, 4 Cans of beer, 0 Shots of liquor, 0 Drinks containing 0.5 oz of alcohol per week Amount  4.0 standard drinks of alcohol/wk Comment  15 DRINKS PER WEEK              Drug Use       Drug Use Status  Yes Types  Marijuana (Weed) Frequency   1 time/week              Sexual Activity       Sexually Active  Yes Partners  Male             He says he lives with his wife in [place] , he is unemployed       Family History     Family History   Problem Relation Age of Onset    Diabetes Mother        Review of Systems   Review of Systems   Constitutional: Low-grade fever since he had his blood clot  Eye: No recent visual problems  ENMT: No ear pain, nasal congestion, sore throat  Respiratory: He says he does get short of breath intermittently denies cough, pain when breathing deeply  Cardiovascular: Complains of pain in his left shoulder middle of chest and right lower  chest  Gastrointestinal: He denies nausea vomiting or diarrhea but says he does not have an appetite  Genitourinary: No hematuria  Hema/Lymph: Negative for bruising tendency, swollen lymph glands  Endocrine: Negative for excessive thirst, excessive hunger  Musculoskeletal: Still with brace on left foot and ankle  Integumentary: No rash, pruritus, abrasions  Neurologic: Alert & oriented X 4  Psychiatric: No anxiety, depression  Physical Exam   BP (!) 119/93   Pulse 86   Temp 97.5 F (36.4 C) (Oral)   Resp 27   Wt 208 lb 12.4 oz (94.7 kg)   SpO2 99%   BMI 32.70 kg/m      Physical Exam  General: Alert and oriented, soft-spoken laying in bed.  Eye: Normal conjuctiva.  HENT: Moist oral mucosa.  Neck: Supple nontender.  Lungs: Clear to auscultation nonlabored respirations on room air, painful when asked to take a deep breath.  Heart: Normal rate, regular rhythm, no murmur.  Abdomen: Soft, non-tender, non-distended, normal bowel sounds.  Musculoskeletal: Foot and ankle immobilizer on left leg.  Skin: Skin is warm and dry.  Neurologic: Awake alert and orient x3.  Psychiatric: Cooperative, appropriate  mood and affect.  Labs    CBC:  Recent Labs     06/22/21  0515 06/24/21  0129   WBC 7.5 8.4   RBC 4.76 4.70   HGB 12.4* 12.5*   HCT 39.8 39.3   MCV 83.6* 83.6*   RDW 14.1 13.8   PLT 173 195     CHEMISTRIES:  Recent Labs     06/22/21  0515 06/24/21  0129   NA 138 136   K 3.6 3.5   CL 102 97*   CO2 25 25   BUN 15 12   CREATININE 1.3 1.0   GLUCOSE 120* 112*     PT/INR:No results for input(s): PROTIME, INR in the last 72 hours.  APTT:  Recent Labs     06/21/21  1431 06/21/21  2059 06/24/21  0406   APTT 83.5* 86.4* 29.7     LIVER PROFILE:  Recent Labs     06/24/21  0129   AST 59*   ALT 133*   BILITOT 0.71   ALKPHOS 182*       Imaging/Diagnostics   CTA Chest W/WO Contrast PE Eval    Result Date: 06/24/2021  Saddle pulmonary embolus extending into the right and left main pulmonary arteries with clot extension into the lobar,  segmental and subsegmental branches  bilaterally. Overall, the clot burden is similar in appearance to CT from 06/20/2021. Mild flattening of the interventricular septum, which could represent right heart strain. RV to LV ratio is less than 1. Interval development of small pulmonary infarcts in the lower lobes. 6 mm right middle lobe nodule, which appears slightly smaller when compared to recent prior CT, and could be inflammatory or infectious.    CTA Chest W/WO Contrast PE Eval    Result Date: 06/20/2021  1. Acute saddle pulmonary embolus involving the right and left main pulmonary arteries with extension into the lobar, segmental, and subsegmental branches as above. Clot burden is most pronounced in the lower lobes bilaterally. Evidence of right heart strain with RV to LV ratio 1.1. 2. Indeterminate 9 x 7 mm pulmonary nodule in the right middle lobe. Minimal surrounding groundglass opacity. Findings may be infectious or inflammatory but this is not definitive. No additional nodules. Short interval follow-up CT the chest in 3 months is recommended. Follow-up routine PET CT could also be considered. 3. Coronary artery calcifications in the LAD. Findings discussed with Dr. Zachery DauerBarnes at 1:45 PM 06/20/2021    XR CHEST PORTABLE    Result Date: 06/21/2021  Diminished lung volumes with probable atelectasis.      Assessment      Hospital Problems             Last Modified POA    * (Principal) Pulmonary embolism, other, unspecified chronicity, unspecified whether acute cor pulmonale present (HCC) 06/24/2021 Yes       Plan     Chest pain-patient presents back to the emergency room this morning with chest pain.  Initial work-up in the emergency room shows negative troponin and EKG.  Repeat CTA was done that showed continued saddle PE with bilateral lower lobe airspace disease most likely infarction with atelectasis.  Work on pain management with some Toradol, Norco and Tylenol.  We will also start him on I-S and get him out of bed with  physical therapy    Saddle pulmonary embolism (provoked from surgery and immobility)-was started on heparin drip on admission we will transition this back to Eliquis.  Echocardiogram last admission showed  normal EF and no RV strain.    Pulmonary nodule-right middle lobe nodule seen on CT with also emphysema.  Will need outpatient follow-up    DVT-last admission had lower extremity Doppler that showed left femoral vein DVT, treatment as above    Hypertension-on hydrochlorothiazide and Cozaar at home.  Systolic blood pressure now in the 100s we will restart when blood pressure more consistently elevated    Hyperlipidemia-on atorvastatin at home LFTs mildly elevated we will continue to trend and hold statin for now    Anxiety-on Cymbalta at home we will restart, trazodone for sleep    DVT prophylaxis-Eliquis  GI prophylaxis-Protonix    GOC: Full code

## 2021-06-24 NOTE — Progress Notes (Signed)
Acute Care Physical Therapy Evaluation   Inpatient   (PTA/PT Visit Days : 1)  Time In: 3149 Time Out: 1400 (14 minutes)  Acknowledge Orders  PT Charge Capture    Admitting Diagnosis:    Pulmonary embolism, other, unspecified chronicity, unspecified whether acute cor pulmonale present ([place]) [I26.99]      Reason for Referral: Other abnormalities of gait and mobility (R26.89)  Payor: [employer] / Plan: [employer] / Product Type: *No Product type* /   SUBJECTIVE   Ronald Herrera is a 51 y.o. male admitted with Pulmonary embolism, other, unspecified chronicity, unspecified whether acute cor pulmonale present ([place]). He  has a past medical history of Anxiety, Arthritis, BMI 32.0-32.9,adult, COVID, Depression, Fibromyalgia, Generalized headache, GERD (gastroesophageal reflux disease), Herpes, Hiatal hernia, Hyperlipidemia, Hypertension, Left ankle instability, Seasonal allergies, Unstable ankle, left, and Wears glasses.  He also  has a past surgical history that includes Foot surgery (Left); Toe Surgery (2019); Ankle surgery (Left, 06/28/2019); Endoscopy, colon, diagnostic (04/09/2021); Ankle surgery (Left, 04/30/2021); and Colonoscopy.    Subjective: Pt appropriate for PT per nursing. pt agreeable to PT and denied feeling light headed or dizzy throughout.     Additional Pertinent History: Pt admitted with new finding of saddle PE, chest pain, SOB. Pt h/o extensive L ankle sx 04/30/21 now WBAT c boot donned.      Social Environment Prior Level of Function   Lives With: Spouse  Type of Home: House  Home Layout: One level  Home Access: Stairs to enter without rails  Bathroom Equipment: Engineer, site: Crutches, Engineer, drilling - Number of Steps: 4  Entrance Stairs - Rails: None  Receives Help From: Family  ADL Assistance: Needs assistance  Homemaking Assistance: Needs assistance  Ambulation Assistance: Independent  Active Driver: No   History of Falls:      Comments:        OBJECTIVE     Vital Signs/Pain  Lines/Drains/Precautions   Vital Signs  SpO2: (!) 87 % (on RA following amb. 30 second seated rest break for recovery to 98%)  O2 Device: None (Room air)    Pain Pain: Pt appropriate for PT per nursing. Pt agreeable to PT and denied feeling light headed or dizzy throughout. He reported chest pain when attempting deep breathing following amb. He reported 2/10 modified borg dyspnea scale following activity.  Peripheral IV 06/24/21 Right Antecubital (Active)       Peripheral IV 06/24/21 Right;Anterior Forearm (Active)       Precautions/Restrictions  Restrictions/Precautions  Restrictions/Precautions: Weight Bearing  Lower Extremity Weight Bearing Restrictions  Left Lower Extremity Weight Bearing: Weight Bearing As Tolerated (c walking boot)     Orientation/Cognition Vision/Hearing   Overall Cognitive Status: WNL  Overall Orientation Status: Within Normal Limits  Orientation Level: Oriented X4            Objective Assessment  Coordination    Left Right Comment   Heel-shin '[]'$ WFL '[]'$ Impaired '[]'$ WFL '[]'$ Impaired    Toe-taps '[]'$ WFL '[]'$ Impaired '[]'$ WFL '[]'$ Impaired    Disdiadochokinesia '[]'$ WFL '[]'$ Impaired '[]'$ WFL '[]'$ Impaired    Finger-nose '[]'$ WFL '[]'$ Impaired '[]'$ WFL '[]'$ Impaired      Balance   Sitting - Static: Good  Sitting - Dynamic: Good  Standing - Static: Good  Standing - Dynamic: Fair;+      FUNCTIONAL ACTIVITY     Transfer Training   Sit to Stand: Modified independent  Stand to Sit: Modified independent     Ambulation   Surface: Level tile  Device: Axillary Crutches  Other Apparatus:  (walking boot)  Assistance: Stand by assistance  Quality of Gait: swing-to gait, good balance, cues for decreased speed to improve endurance.  Distance: 30  Comments: Sats 98 upon return to chair then slowly trending down to 87%.  More Ambulation?: Yes     Safety: Type of Devices: All fall risk precautions in place     Education: Education Given To: Patient;Family  Education Provided: Comptroller Method: Demonstration;Verbal  Barriers to  Learning: None  Education Outcome: Verbalized understanding;Demonstrated understanding    [facility]	[place] AM-PACT "6 Clicks" Basic Mobility Inpatient Short Form   How much help is needed turning from your back to your side while in a flat bed without using bedrails?: None  How much help is needed moving from lying on your back to sitting on the side of a flat bed without using bedrails?: None  How much help is needed moving to and from a bed to a chair?: None  How much help is needed standing up from a chair using your arms?: None  How much help is needed walking in hospital room?: A Little  How much help is needed climbing 3-5 steps with a railing?: A Little  AM-PAC Inpatient Mobility Raw Score : 22  AM-PAC Inpatient T-Scale Score : 53.28  Mobility Inpatient CMS 0-100% Score: 20.91  Mobility Inpatient CMS G-Code Modifier : CJ        ASSESSMENT   Assessment:  Pt was pleasant and cooperative throughout. He demonstrated excellent balance and AD use throughout but was not bearing weight through LLE. Pt sats dropped to 87% once returned to sitting in recliner following amb requiring 30 second seated rest break for recovery. Pt reported 2/10 modified BORG dyspnea scale following activity. Pt will benefit from continued therapy services, HHPT vs OPPT, at D/C pending progress.      Evaluation Complexity: Medium Complexity      POST ACUTE RECOMMENDATIONS   Setting: Home Health Therapy    Justification for Recommended Setting: Recommended to help patient obtain maximum functional independence.     Equipment:   axillary crutches    Discharge Transportation Recommendations: No stretcher     PLAN   Frequency & Duration: 3 times/week for duration of hospital stay or until stated goals are met, whichever comes first.    Ronald Herrera presents with Excellent therapy prognosis. Skilled intervention is medically necessary to address the following performance deficits:Decreased functional mobility , Decreased tolerance to work activity,  Decreased high-level IADLs, Decreased endurance.  Benefits and precautions of physical therapy have been discussed with Mr. Gatling, and the following interventions are recommended: Balance training, Functional mobility training, Transfer training, Endurance training, Gait training, Stair training, Barrister's clerk, education, Visual merchandiser, Patient/Caregiver education & training, Safety education & training, Home exercise program, Therapeutic activities     Goals    Short Term Goals Long Term Goals   Time Frame for Short Term Goals: 5 visits  Short Term Goal 1: Pt will amb 100 ft c LRAD c SBA c sats >90%  Short Term Goal 2: Pt will report modified BORG scale 1/10 following amb 100 ft c LRAD Time Frame for Long Term Goals : 10 visits  Long Term Goal 1: Pt will amb 150 ft c LRAD c SBA c sats >90%.      Therapist Signature: Nelva Bush Javarri Segal    Date: 06/24/2021.

## 2021-06-24 NOTE — Care Coordination-Inpatient (Signed)
06/24/21 1134   Service Assessment   Patient Orientation Alert and Oriented   Cognition Alert   History Provided By Patient   Primary Caregiver Self   Support Systems Spouse/Significant Other   PCP Verified by CM Yes   Prior Functional Level Assistance with the following:;Mobility   Current Functional Level Assistance with the following:;Mobility   Can patient return to prior living arrangement Unknown at present   Ability to make needs known: Good   Family able to assist with home care needs: Yes   Horticulturist, commercial ([place])   Community Resources None   Social/Functional History   Lives With Spouse   Type of Home House   Home Layout One level   Secondary school teacher chair   Home Equipment Crutches;Cane   Receives Help From Family   ADL Assistance Needs assistance   Toileting Independent   Homemaking Assistance Needs assistance   Active Driver No   Discharge Planning   Type of Residence Trailer/Mobile Home   Living Arrangements Spouse/Significant Other   Current Services Prior To Admission None   Current DME Prior to Arrival Crutches;Cane   Potential Assistance Needed Durable Medical Equipment;Home Care   Potential Assistance Purchasing Medications No   Patient expects to be discharged to: Trailer/mobile home     Ronald Herrera lives with his spouse at home. DME = crutches/cane. Recent ankle fx. Rx are filled with no issues. He is was recently started on Eliquis. Case Management will f/u for discharge needs. Insurance is with the [place].

## 2021-06-24 NOTE — ED Provider Notes (Addendum)
[facility] EMERGENCY DEPT  EMERGENCY DEPARTMENT ENCOUNTER      Pt Name: Ronald Herrera  MRN: [id number]  [place] Dec 14, 1970  Date of evaluation: 06/24/2021  Provider: Eyvonne Mechanic, MD    CHIEF COMPLAINT       Chief Complaint   Patient presents with    Shortness of Breath    Chest Pain     Pt c/o heavy pain in chest for last few hours. Patient dx with pe and dvt. Pt on eliquis. Pt unable to speak to nurse d/t feeling like he cant a deep breath. Patient discharged from hospital yesterday. Per family pt o2 sat on walk test was 91%         HISTORY OF PRESENT ILLNESS    Patient presents with increasing chest pain shortness of breath recently discharged for multiple pulmonary emboli not be hypoxic on arrival no radiation no fever no chills initially improving but pain increased over the last 2 hours prior to arrival        Nursing Notes were reviewed.    REVIEW OF SYSTEMS     Review of Systems   Constitutional:  Negative for activity change.   Respiratory:  Negative for apnea.    Skin:  Negative for color change.   Hematological:  Does not bruise/bleed easily.   All other systems reviewed and are negative.    Except as noted above the remainder of the review of systems was reviewed and negative.     PAST MEDICAL HISTORY     Past Medical History:   Diagnosis Date    Anxiety     Arthritis     LEFT ANKLE    BMI 32.0-32.9,adult     COVID 2022    Depression     Fibromyalgia     Generalized headache     GERD (gastroesophageal reflux disease)     Herpes     Hiatal hernia     Hyperlipidemia     Hypertension     Left ankle instability     Seasonal allergies     Unstable ankle, left     Wears glasses        SURGICAL HISTORY       Past Surgical History:   Procedure Laterality Date    ANKLE SURGERY Left 06/28/2019    left ankle partial resection tibia, partial resection fibula, brostrom gould lateral ankle ligament reconstruction, left ankle mosaicplasty with aosteochondral plug transfer from ipsilateral knee-dr. blake ohlson    ANKLE SURGERY  Left 04/30/2021    LEFT TIBIOTALAR ARTHRODESIS WITH ASPIRATION PROXIMAL TIBIA, + Left ankle arthroscopy performed by Thurman Coyer, MD at [facility]    ENDOSCOPY, COLON, DIAGNOSTIC  04/09/2021    FOOT SURGERY Left     IP fusion 12/2017    TOE SURGERY  2019    Left foot [facility] [place]       Previous Medications    ACETAMINOPHEN (TYLENOL) 500 MG TABLET    Take 2 tablets by mouth 2 times daily    ACYCLOVIR (ZOVIRAX) 400 MG TABLET    Take 1 tablet by mouth 2 times daily    APIXABAN (ELIQUIS) 5 MG TABS TABLET    Take 2 tablets by mouth 2 times daily for 7 days, THEN 1 tablet 2 times daily for 24 days.    APIXABAN (ELIQUIS) 5 MG TABS TABLET    Take 1 tablet by mouth 2  times daily    ASCORBIC ACID (VITAMIN C) 250 MG TABLET    Take 1 tablet by mouth daily    ATORVASTATIN (LIPITOR) 40 MG TABLET    Take 1 tablet by mouth daily    CETIRIZINE (ZYRTEC) 10 MG TABLET    Take 1 tablet by mouth every morning    CYPROHEPTADINE (PERIACTIN) 4 MG TABLET    Take 2 tablets by mouth nightly    DULOXETINE (CYMBALTA) 60 MG EXTENDED RELEASE CAPSULE    Take 1 capsule by mouth daily    ERGOCALCIFEROL (ERGOCALCIFEROL) 1.25 MG (50000 UT) CAPSULE    Take 1 capsule by mouth once a week (Monday)    GABAPENTIN (NEURONTIN) 300 MG CAPSULE    Take 2 capsules by mouth 3 times daily for 30 days.    HYDROCHLOROTHIAZIDE (HYDRODIURIL) 25 MG TABLET    Take 1 tablet by mouth every morning    LOSARTAN (COZAAR) 100 MG TABLET    Take 1 tablet by mouth daily    MULTIPLE VITAMINS-MINERALS (THERAPEUTIC MULTIVITAMIN-MINERALS) TABLET    Take 1 tablet by mouth daily    NALOXONE (NARCAN) 4 MG/0.1ML LIQD NASAL SPRAY    1 spray by Nasal route as needed for Opioid Reversal    OLOPATADINE (PATANOL) 0.1 % OPHTHALMIC SOLUTION    Place 1 drop into both eyes daily as needed for Allergies    OXYCODONE (ROXICODONE) 5 MG IMMEDIATE RELEASE TABLET    Take 1 tablet by mouth every 6 hours as needed for Pain.     PANTOPRAZOLE (PROTONIX) 40 MG TABLET    Take 1 tablet by mouth every morning    SUCRALFATE (CARAFATE) 1 GM TABLET    Take 1 tablet by mouth 3 times daily    TADALAFIL (CIALIS) 20 MG TABLET    Take 1 tablet by mouth as needed for Erectile Dysfunction    TRAZODONE (DESYREL) 100 MG TABLET    Take 1.5 tablets by mouth nightly       ALLERGIES     Bee pollen, Grass pollen([id number] vern), Pollen extract, Bromfenac, and Nsaids    FAMILY HISTORY       Family History   Problem Relation Age of Onset    Diabetes Mother         SOCIAL HISTORY       Social History     Socioeconomic History    Marital status: Married   Tobacco Use    Smoking status: Former     Packs/day: 1.00     Years: 15.00     Pack years: 15.00     Types: Cigarettes     Start date: 02/16/1991     Quit date: 12/02/2020     Years since quitting: 0.5    Smokeless tobacco: Never   Substance and Sexual Activity    Alcohol use: Yes     Alcohol/week: 4.0 standard drinks     Types: 4 Cans of beer per week     Comment: 15 DRINKS PER WEEK    Drug use: Yes     Frequency: 3.0 times per week     Types: Marijuana Sherrie Mustache)    Sexual activity: Yes     Partners: Female   Social History Narrative    ** Merged History Encounter **                 PHYSICAL EXAM    (up to 7 for level 4, 8 or more for level 5)     ED Triage  Vitals [06/24/21 0014]   BP Temp Temp Source Pulse Respirations SpO2 Height Weight - Scale   (!) 134/97 99.1 F (37.3 C) Oral 86 22 96 % 5' 7"  (1.702 m) 211 lb (95.7 kg)       Physical Exam  Vitals and nursing note reviewed.   Constitutional:       General: He is not in acute distress.  HENT:      Head: Normocephalic. No raccoon eyes.      Mouth/Throat:      Comments: Airway patent  Eyes:      General: Vision grossly intact.      Comments: No bleeding   Cardiovascular:      Comments: Normal peripheral perfusion  Pulmonary:      Effort: No respiratory distress.   Abdominal:      Comments: No rigidity    Musculoskeletal:         General: No deformity.      Cervical  back: No rigidity.   Lymphadenopathy:      Comments: No petechia   Skin:     General: Skin is dry.      Comments: No petechia   Neurological:      Mental Status: Mental status is at baseline.      Comments: Alert; Moving all extremities   Psychiatric:         Behavior: Behavior is cooperative.       DIAGNOSTIC RESULTS       RADIOLOGY:   Non-plain film images such as CT, Ultrasound and MRI are read by the radiologist. Plain radiographic images are visualized and preliminarily interpreted by the emergency physician with the below findings:    The ECG interpreted by me, in the absence of a cardiologist, shows:  normal sinus rhythm   QRS wnl  Interpretation NO STEMI      Interpretation per the Radiologist below, if available at the time of this note:    CTA Chest W/WO Contrast PE Eval    (Results Pending)       LABS:  Labs Reviewed   CBC WITH AUTO DIFFERENTIAL - Abnormal; Notable for the following components:       Result Value    Hemoglobin 12.5 (*)     MCV 83.6 (*)     MCH 26.6 (*)     MCHC 31.8 (*)     All other components within normal limits   COMPREHENSIVE METABOLIC PANEL - Abnormal; Notable for the following components:    Chloride 97 (*)     Glucose 112 (*)     Alk Phosphatase 182 (*)     AST 59 (*)     ALT 133 (*)     All other components within normal limits   BRAIN NATRIURETIC PEPTIDE   LACTIC ACID   TROPONIN   MANUAL DIFFERENTIAL   APTT   APTT   APTT       All other labs were within normal range or not returned as of this dictation.      EMERGENCY DEPARTMENT COURSE/REASSESSMENT and MDM:   Vitals:    Vitals:    06/24/21 0409 06/24/21 0415 06/24/21 0430 06/24/21 0445   BP:  125/83 (!) 128/90 (!) 132/93   Pulse: 83 86 82 85   Resp: 13 15  18    Temp:    98.9 F (37.2 C)   TempSrc:    Oral   SpO2: 99% 99% 100% 100%   Weight:  Height:           ED Course:       MDM  Number of Diagnoses or Management Options  Dyspnea, unspecified type  Hypoxia  Pulmonary embolism, other, unspecified chronicity, unspecified  whether acute cor pulmonale present [facility])  Pulmonary infarct [facility])  Diagnosis management comments: Patient presents with chest pain shortness of breath secondary to pulmonary emboli patient is now developing pulmonary infarct is likely etiology of his pain given the hypoxia patient does have right heart strain however is not significantly changed from his prior CT scan.  I discussed case with Dr. Yancey Flemings [place] intens[place] states patient is not a candidate for interventional therapy given the development of infarcts and time course.  As such discussed case with hospitalist at [facility] for admission due to bed availability patient was amenable to transfer patient was admitted to the stepdown unit patient did remain hemodynamically stable here was given pain control was initiated on heparin    Critical care note: 5 minutes critical care time was administered independent of any other procedures solely by myself to the patient's potential for rapid deterioration given pulmonary emboli pulmonary infarct and heart strain.  This involved discussion with medical specialist reviewing CAT scans x-rays managing hemodynamics and oxygenation.  Patient was stabilized 0 512 reassessed patient remained hemodynamically stable       Amount and/or Complexity of Data Reviewed  Clinical lab tests: reviewed and ordered  Tests in the radiology section of CPT: ordered and reviewed  Tests in the medicine section of CPT: ordered and reviewed  Discussion of test results with the performing providers: yes  Decide to obtain previous medical records or to obtain history from someone other than the patient: yes  Obtain history from someone other than the patient: yes  Review and summarize past medical records: yes  Discuss the patient with other providers: yes  Independent visualization of images, tracings, or specimens: yes        CONSULTS:  None    FINAL IMPRESSION      1. Dyspnea, unspecified type    2. Hypoxia    3. Pulmonary  embolism, other, unspecified chronicity, unspecified whether acute cor pulmonale present ([place])    4. Pulmonary infarct [facility])          DISPOSITION/PLAN   DISPOSITION Decision To Transfer 06/24/2021 05:01:51 AM      PATIENT REFERRED TO:  No follow-up provider specified.    DISCHARGE MEDICATIONS:  New Prescriptions    No medications on file     Controlled Substances Monitoring:     No flowsheet data found.    (Please note that portions of this note were completed with a voice recognition program.  Efforts were made to edit the dictations but occasionally words are mis-transcribed.)    Eyvonne Mechanic, MD (electronically signed)  Attending Emergency Physician            Eyvonne Mechanic, MD  06/24/21 5277       Eyvonne Mechanic, MD  06/24/21 [phone]

## 2021-06-25 ENCOUNTER — Inpatient Hospital Stay: Payer: PRIVATE HEALTH INSURANCE

## 2021-06-25 ENCOUNTER — Inpatient Hospital Stay: Admit: 2021-06-25 | Payer: PRIVATE HEALTH INSURANCE

## 2021-06-25 LAB — TROPONIN
Troponin T: <0.01 ng/mL (ref 0.000–0.010)
Troponin T: <0.01 ng/mL (ref 0.000–0.010)

## 2021-06-25 LAB — CBC WITH AUTO DIFFERENTIAL
Basophils %: 0.3 % (ref 0.0–2.0)
Basophils Absolute: 0 10*3/uL (ref 0.0–0.2)
Eosinophils %: 2.3 % (ref 0.0–7.0)
Eosinophils Absolute: 0.2 10*3/uL (ref 0.0–0.5)
Hematocrit: 36.2 % — ABNORMAL LOW (ref 38.0–52.0)
Hemoglobin: 11.6 g/dL — ABNORMAL LOW (ref 13.0–17.3)
Immature Grans (Abs): 0.02 10*3/uL (ref 0.00–0.06)
Immature Granulocytes %: 0.3 % (ref 0.0–0.6)
Lymphocytes Absolute: 1.5 10*3/uL (ref 1.0–3.2)
Lymphocytes: 22.8 % (ref 15.0–45.0)
MCH: 26.9 pg — ABNORMAL LOW (ref 27.0–34.5)
MCHC: 32 g/dL (ref 32.0–36.0)
MCV: 84 fL (ref 84.0–100.0)
MPV: 10.9 fL (ref 7.2–13.2)
Monocytes %: 8.9 % (ref 4.0–12.0)
Monocytes Absolute: 0.6 10*3/uL (ref 0.3–1.0)
Neutrophils %: 65.4 % (ref 42.0–74.0)
Neutrophils Absolute: 4.4 10*3/uL (ref 1.6–7.3)
Platelets: 216 10*3/uL (ref 140–440)
RBC: 4.31 x10e6/mcL (ref 4.00–5.60)
RDW: 13.8 % (ref 11.0–16.0)
WBC: 6.7 10*3/uL (ref 3.8–10.6)

## 2021-06-25 LAB — HEPATIC FUNCTION PANEL
ALT: 93 U/L — ABNORMAL HIGH (ref 0–50)
AST: 35 U/L (ref 0–50)
Albumin: 3.7 g/dL (ref 3.5–5.2)
Alk Phosphatase: 162 U/L — ABNORMAL HIGH (ref 40–130)
Bilirubin, Direct: <0.2 mg/dL (ref 0.00–0.30)
Total Bilirubin: 0.33 mg/dL (ref 0.00–1.20)
Total Protein: 7 g/dL (ref 6.4–8.3)

## 2021-06-25 LAB — BASIC METABOLIC PANEL
Anion Gap: 11 mmol/L (ref 2–17)
BUN: 15 mg/dL (ref 6–20)
CO2: 28 mmol/L (ref 22–29)
Calcium: 8.6 mg/dL (ref 8.6–10.0)
Chloride: 99 mmol/L (ref 98–107)
Creatinine: 1 mg/dL (ref 0.7–1.3)
Est, Glom Filt Rate: 92 mL/min/1.73m?? (ref >=60)
Glucose: 133 mg/dL — ABNORMAL HIGH (ref 70–99)
Osmolaliy Calculated: 278 mosm/kg (ref 270–287)
Potassium: 3.4 mmol/L — ABNORMAL LOW (ref 3.5–5.3)
Sodium: 138 mmol/L (ref 135–145)

## 2021-06-25 LAB — LACTIC ACID: Lactic Acid: 1.1 mmol/L (ref 0.5–2.0)

## 2021-06-25 LAB — MAGNESIUM: Magnesium: 2.1 mg/dL (ref 1.6–2.6)

## 2021-06-25 LAB — BRAIN NATRIURETIC PEPTIDE: NT Pro-BNP: <36 pg/mL (ref 0–125)

## 2021-06-25 MED ORDER — GABAPENTIN 300 MG PO CAPS
300 MG | Freq: Three times a day (TID) | ORAL | Status: AC
Start: 2021-06-25 — End: 2021-06-26
  Administered 2021-06-25 – 2021-06-26 (×4): 600 mg via ORAL

## 2021-06-25 MED ORDER — SUCRALFATE 1 G PO TABS
1 GM | Freq: Four times a day (QID) | ORAL | Status: AC
Start: 2021-06-25 — End: 2021-06-26
  Administered 2021-06-25 – 2021-06-26 (×5): 1 g via ORAL

## 2021-06-25 MED ORDER — GABAPENTIN 300 MG PO CAPS
300 MG | Freq: Three times a day (TID) | ORAL | Status: DC
Start: 2021-06-25 — End: 2021-06-25
  Administered 2021-06-25: 13:00:00 300 mg via ORAL

## 2021-06-25 MED ORDER — MORPHINE SULFATE (PF) 4 MG/ML IJ SOLN
4 MG/ML | INTRAMUSCULAR | Status: AC | PRN
Start: 2021-06-25 — End: 2021-06-26
  Administered 2021-06-25 – 2021-06-26 (×2): 4 mg via INTRAVENOUS

## 2021-06-25 MED ORDER — MORPHINE SULFATE (PF) 4 MG/ML IJ SOLN
4 MG/ML | Freq: Once | INTRAMUSCULAR | Status: AC
Start: 2021-06-25 — End: 2021-06-25
  Administered 2021-06-25: 12:00:00 2 mg via INTRAVENOUS

## 2021-06-25 MED ORDER — TRAZODONE HCL 50 MG PO TABS
50 MG | Freq: Every evening | ORAL | Status: AC
Start: 2021-06-25 — End: 2021-06-26
  Administered 2021-06-25 – 2021-06-26 (×2): 150 mg via ORAL

## 2021-06-25 MED ORDER — ACYCLOVIR 200 MG PO CAPS
200 MG | Freq: Two times a day (BID) | ORAL | Status: AC
Start: 2021-06-25 — End: 2021-06-26
  Administered 2021-06-25 – 2021-06-26 (×4): 400 mg via ORAL

## 2021-06-25 MED ORDER — CYPROHEPTADINE HCL 4 MG PO TABS
4 MG | Freq: Every evening | ORAL | Status: AC
Start: 2021-06-25 — End: 2021-06-26
  Administered 2021-06-25 – 2021-06-26 (×2): 8 mg via ORAL

## 2021-06-25 MED FILL — ACYCLOVIR 200 MG PO CAPS: 200 MG | ORAL | Qty: 2

## 2021-06-25 MED FILL — GABAPENTIN 300 MG PO CAPS: 300 MG | ORAL | Qty: 1

## 2021-06-25 MED FILL — MORPHINE SULFATE 4 MG/ML IJ SOLN: 4 mg/mL | INTRAMUSCULAR | Qty: 1

## 2021-06-25 MED FILL — NORMAL SALINE FLUSH 0.9 % IV SOLN: 0.9 % | INTRAVENOUS | Qty: 40

## 2021-06-25 MED FILL — NORMAL SALINE FLUSH 0.9 % IV SOLN: 0.9 % | INTRAVENOUS | Qty: 30

## 2021-06-25 MED FILL — KETOROLAC TROMETHAMINE 15 MG/ML IJ SOLN: 15 MG/ML | INTRAMUSCULAR | Qty: 1

## 2021-06-25 MED FILL — HYDROCODONE-ACETAMINOPHEN 5-325 MG PO TABS: 5-325 MG | ORAL | Qty: 1

## 2021-06-25 MED FILL — SUCRALFATE 1 G PO TABS: 1 GM | ORAL | Qty: 1

## 2021-06-25 MED FILL — CYPROHEPTADINE HCL 4 MG PO TABS: 4 MG | ORAL | Qty: 2

## 2021-06-25 MED FILL — DULOXETINE HCL 30 MG PO CPEP: 30 MG | ORAL | Qty: 2

## 2021-06-25 MED FILL — GABAPENTIN 300 MG PO CAPS: 300 MG | ORAL | Qty: 2

## 2021-06-25 MED FILL — KLOR-CON M20 20 MEQ PO TBCR: 20 MEQ | ORAL | Qty: 2

## 2021-06-25 MED FILL — TRAZODONE HCL 50 MG PO TABS: 50 MG | ORAL | Qty: 3

## 2021-06-25 MED FILL — NORMAL SALINE FLUSH 0.9 % IV SOLN: 0.9 % | INTRAVENOUS | Qty: 10

## 2021-06-25 MED FILL — ELIQUIS 5 MG PO TABS: 5 MG | ORAL | Qty: 2

## 2021-06-25 MED FILL — PANTOPRAZOLE SODIUM 40 MG PO TBEC: 40 MG | ORAL | Qty: 1

## 2021-06-25 NOTE — Progress Notes (Signed)
..Hospitalist Daily Progress Note      Subjective:   Pt seen and examined.       Reportedly did very well overnight.  Now this morning with significant pain.  Seen earlier this morning.  Pain in chest.  Worse with deep inspiration.  No radiation to the back.  Objective:      Vitals:    06/25/21 1530   BP: 106/70   Pulse: 90   Resp: 15   Temp:    SpO2: 95%      Physical Exam:  General: NAD, AAOX3  Sclera  No scleral icterus.  Neck is supple  Lungs are clear to auscultation decreased breath sounds throughout.  No crackles.  No wheezes.  No rales  Heart normal S1-S2 without murmur rub or gallop  Abdomen soft nontender  No cyanosis sinus, clubbing, edema  Edema left lower extremity with wound dressed.  Will let orthopedic team know of patient's admission.  MSK: Moves all 4 extremities spontaneously, normal range of motion  Psych: normal mood and affect        Imaging:  Xray Result (most recent):  XR CHEST LIMITED ONE VIEW 06/25/2021    Narrative  Chest AP: 06/25/21    INDICATION: resp failure I26.99,Other pulmonary embolism without acute cor  pulmonale,ICD-10-CM    COMPARISON: CT 06/24/2021, radiograph 06/21/2021    FINDINGS: Very low volumes. No pneumothorax. Vascular crowding. No lobar  consolidation. No overt edema.    Impression  Very low volumes. Bibasilar atelectasis versus scarring seen on CT is not  apparent by low volume AP technique.     06/20/21    TRANSTHORACIC ECHOCARDIOGRAM (TTE) COMPLETE (CONTRAST/BUBBLE/3D PRN) 06/20/2021  6:53 PM (Final)    Interpretation Summary    Left Ventricle: Normal left ventricular systolic function with a visually estimated EF of 70 - 75%. Left ventricle is smaller than normal. Mildly increased wall thickness. Findings consistent with mild concentric hypertrophy. Normal wall motion.    Tricuspid Valve: Mildly elevated RVSP. The estimated RVSP is 39 mmHg.    IVC/SVC: IVC diameter is less than or equal to 21 mm and decreases greater than 50% during inspiration; therefore the  estimated right atrial pressure is normal (~3 mmHg). IVC size is normal.    Signed by: Adah Perl, MD on 06/20/2021  6:53 PM     CT Result (most recent):  CTA CHEST W WO CONTRAST 06/24/2021    Narrative  CTA chest: 06/24/21    INDICATION: PE shortness of breath, chest pain    COMPARISON: CTA 06/20/2021    TECHNIQUE: PE protocol (Postcontrast imaging from the thoracic inlet through the  hemidiaphragms in the pulmonary arterial phase. Axial 5x5 mm soft tissue and  lung 2x2 mm images. Multiplanar 3-D volumetric MIP reconstructions through the  pulmonary arteries per protocol.) CT scanning was performed using radiation dose  reduction techniques when appropriate, per system protocols.    Pulmonary arteries: Subtle pulmonary embolus involving the right and left main  pulmonary arteries. There is clot extension into lobar, segmental and  subsegmental branches bilaterally. Overall, the clot burden appears similar to  prior CT.    Lungs/Airways: There are small subpleural consolidations in the basal lower  lobes bilaterally, which are new from prior exam. 6 mm nodule in the right  middle lobe.    Pleura: No pleural effusion or pneumothorax.    Lymph Nodes: No mediastinal, hilar, or axillary adenopathy.    Cardiovascular: Normal heart size. No pericardial effusion. RV:LV ratio is less  than 1. There is very mild flattening of the interventricular septum.    Osseous structures: No suspicious lytic or blastic osseous lesions.    Upper Abdomen: Unremarkable.    Impression  Saddle pulmonary embolus extending into the right and left main pulmonary  arteries with clot extension into the lobar, segmental and subsegmental branches  bilaterally. Overall, the clot burden is similar in appearance to CT from  06/20/2021.    Mild flattening of the interventricular septum, which could represent right  heart strain. RV to LV ratio is less than 1.    Interval development of small pulmonary infarcts in the lower lobes.    6 mm right middle  lobe nodule, which appears slightly smaller when compared to  recent prior CT, and could be inflammatory or infectious.     MRI Result (most recent):  MRI ANKLE LEFT WO CONTRAST 04/24/2019    Narrative  EXAM: MRI ANKLE LT WO CONT    INDICATION: Left ankle pain and possible osteochondral lesion.    COMPARISON: None.    TECHNIQUE: Axial T2 and proton density fat-saturated; coronal T2 fat-saturated;  sagittal T1, T2 fat-saturated, and spoiled gradient-echo MRI of the left ankle .    CONTRAST: None.    FINDINGS: Bone Marrow: Heterogeneous cystic degenerative signal in the lateral  talar dome measures 15 x 7 x 10 mm. No acute fracture or osteonecrosis.    Joint fluid: Physiologic. Ganglion cyst arises from the anterior margin of the  posterior subtalar joint and courses along the lateral aspect of the talar neck.  Volume measures 2.2 x 1.2 x 1.1 cm.    Tendons: intact.    Muscles: Within normal limits.    Lateral ligaments: intact.    Deltoid and spring ligaments: intact.    Sinus tarsi: Edema partially replaces the fat.    Plantar fascia: Within normal limits.    Articular cartilage: Heterogeneous chondral loss from the far lateral aspect of  the talar dome is at least shallow partial-thickness. Minimal talonavicular  osteoarthritis. No evidence of coalition.    Soft tissue mass: None. Presumed ganglion cyst as described above in the joint  fluid section.    Impression  1. 15 x 7 x 10 mm cystic osteochondral lesion in the lateral talar dome.  2. 22 mm ganglion cyst arises from the posterior subtalar joint and courses  medial to the talar neck. Adjacent sinus tarsi inflammation.  3. Intact tendons and ligaments.       Lab Review   Recent Labs     06/25/21  0539 06/24/21  0129 06/22/21  0515   WBC 6.7 8.4 7.5   HGB 11.6* 12.5* 12.4*   HCT 36.2* 39.3 39.8   MCV 84.0 83.6* 83.6*   PLT 216 195 173        Lab Results   Component Value Date    NA 138 06/25/2021    K 3.4 (L) 06/25/2021    CL 99 06/25/2021    CO2 28 06/25/2021     BUN 15 06/25/2021    CREATININE 1.0 06/25/2021    GLUCOSE 133 (H) 06/25/2021    CALCIUM 8.6 06/25/2021    PROT 7.0 06/25/2021    LABALBU 3.7 06/25/2021    BILITOT 0.33 06/25/2021    ALKPHOS 162 (H) 06/25/2021    AST 35 06/25/2021    ALT 93 (H) 06/25/2021    LABGLOM 92 06/25/2021    GFRAA 103 05/14/2019    GLOB 4.2 06/24/2021  Lab Results   Component Value Date/Time    MG 2.1 06/25/2021 05:39 AM      No results found for: CKTOTAL, CKMB, CKMBINDEX, TROPONINI         Meds:  Scheduled Meds:   sucralfate  1 g Oral 4x Daily AC & HS    gabapentin  600 mg Oral TID    sodium chloride flush  5-40 mL IntraVENous 2 times per day    pantoprazole  40 mg Oral QAM AC    DULoxetine  60 mg Oral Daily    apixaban  10 mg Oral BID    Followed by    Melene Muller[START ON 06/29/2021] apixaban  5 mg Oral BID    acyclovir  400 mg Oral BID    traZODone  150 mg Oral Nightly    cyproheptadine  8 mg Oral Nightly     Continuous Infusions:   sodium chloride       PRN Meds:morphine, sodium chloride flush, sodium chloride, ondansetron **OR** ondansetron, polyethylene glycol, acetaminophen **OR** acetaminophen, potassium chloride **OR** potassium alternative oral replacement **OR** potassium chloride, magnesium sulfate, ketorolac, HYDROcodone 5 mg - acetaminophen    Assessment:   51 y.o. 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/15/2020    Cellulitis and abscess of face 09/15/2020    Chalazion left upper eyelid 09/15/2020    Chronic post-traumatic stress disorder (PTSD) 09/15/2020    Posttraumatic stress disorder 09/15/2020    Post-traumatic stress disorder, chronic 09/15/2020    Constipation 09/15/2020    Fracture of phalanx of toe 09/15/2020     Herpesviral infection of other male genital organs 09/15/2020    Homicidal thoughts 09/15/2020    Benign essential hypertension 09/15/2020    Essential (primary) hypertension 09/15/2020    Hypertension 09/15/2020    Primary hypertension 09/15/2020    Pain in left ankle and joints of left foot 09/15/2020    Joint pain 09/15/2020    Myalgia and myositis 09/15/2020    Arthralgia of hand 09/15/2020    Nicotine dependence 09/15/2020    Painful spasm of anus 09/15/2020    Flat foot (pes planus) (acquired), unspecified foot 09/15/2020    Pes planus 09/15/2020    Reason for consultation 09/15/2020    Recurrent genital herpes simplex 09/15/2020    Secondary osteoarthritis, left ankle and foot 09/15/2020    Nicotine dependence, other tobacco product, in remission 09/15/2020    Tobacco dependence in remission 09/15/2020    Tobacco use disorder 09/15/2020    Vitamin D deficiency 09/15/2020    Primary osteoarthritis, 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    Pain in left knee 09/15/2020    Peptic ulcer disease with hemorrhage 09/15/2020    PUD (peptic ulcer disease) 09/10/2020    Patellofemoral syndrome of left knee 09/10/2020    Patellar tendinitis of left knee 09/10/2020    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 09/10/2020    Left foot pain 09/10/2020    Instability of left ankle joint 09/10/2020    Impingement syndrome, shoulder, left 09/10/2020    Fibromyalgia 09/10/2020    Arthritis of neck 09/10/2020    Ganglion cyst  05/10/2019    Osteochondral lesion of talar dome 05/10/2019    Primary osteoarthritis of both feet 04/17/2019    Chronic ankle pain 01/24/2019    Pulmonary embolism, other, unspecified chronicity, unspecified whether acute cor pulmonale present (HCC) 06/24/2021    Pulmonary embolism and infarction (HCC) 06/20/2021    Ganglion cyst 05/10/2019    Osteochondral lesion of talar dome 05/10/2019    Primary osteoarthritis of both feet 04/17/2019    Chronic pain of left ankle 01/24/2019    Chronic pain of right  ankle 01/24/2019       Plan:   # Saddle pulmonary embolism   # Acute pulmonary infarcts   # Femoral vein DVT   Admitted on May 10.    Patient was [facility] from [facility] on May 8.    Patient initially presented with LLE edema and tenderness (s/p recent ankle fusion); found to have D-dimer of 11 and left femoral vein DVT   Was on heparin drip at [facility] and converted to Eliquis.  No intervention and the patient was hemodynamically stable with negative troponins and normal BNP  CTA chest (5/10): extensive bilateral PE, with saddle embolus at level of left and right pulmonary artery bifurcation.  Mild right heart strain as evidence of interventricular septum flattening.   Mild bilateral posterior lower lung atelectasis (OR developing pulmonary infarcts).   Patient remains hemodynamically stable, without tachycardia, hypotension, nor hypoxia   ANA ordered on prior admission- negative.   Severe provoked deep venous thrombosis with pulmonary emboli secondary to ankle surgery on April 30, 2021  Patient was on aspirin 325 mg p.o. daily for DVT prophylaxis prior to admission     Discussed with pulmonary/critical care.  full dose Eliquis 10 mg p.o. twice daily.  For 1 Week.  We will then transition to Eliquis 5 mg p.o. twice daily for long-term.  Probably provoked and will need 3 to 6 months of therapy.  Ambulate about.  Continue to monitor troponin and BNP.  As needed morphine.  Will use low-dose Toradol IV initially for pain management.  Will discharge in 1 to 2 days.  Will need home health with home PT and OT.     # Essential hypertension   Appears to be prescribed losartan;   though not recently dispensed   Currently normotensive   -Will hold outpatient antihypertensives given tenuous hemodynamic status      # Coronary calcifications  CT angiogram demonstrating left anterior descending calcifications.    EKG  without signs of ischemic pathology   -Patient instructed to follow-up with cardiology as an  outpatient for ischemic testing .     # Pulmonary nodule   46mm non-calcified nodule in the right middle lobe; incidentally noted on CTA chest  -Outpatient surveillance with PCP      #Status post left ankle surgery on April 30, 2021/Dr. Lamb   s/p left ankle arthroscopy, microfracture OCD lesion, lateral ligament reconstruction, peroneal tendon debridement, superficial peroneal nerve neurolysis  Will let surgical team know about patient's readmission/admission.    DVT prophylaxis: Anticoagulation with full dose Eliquis   Dispo: Is to go home.  Code status: Full Code   Medical Decision maker: Self and wife  PCP: None None     This note was created using voice recognition software and may contain typographic errors missed during final review. The intent is to have a complete and accurate medical record.  As a valued partner in this safety effort, if you have noted factual  errors, please complete the Health Information Amendment/Correct Form or call the [place] Management Office at [phone].    For any questions, please contact Team B

## 2021-06-25 NOTE — Progress Notes (Signed)
ICU progress note     Subjective:   Critical Care Daily Progress Note: 06/25/2021     Interval History:   Yesterday worked with PT and did have an episode of desaturation  Rested well overnight, rested did not require any pain meds.  This morning however is on lots of left sided pleuritic chest pain with splinting of respirations; no bleeding, no syncope, no N/V  Hemodynamics and oxygen saturation stable    Scheduled Meds:   sodium chloride flush  5-40 mL IntraVENous 2 times per day    pantoprazole  40 mg Oral QAM AC    DULoxetine  60 mg Oral Daily    apixaban  10 mg Oral BID    Followed by    Melene Muller ON 06/29/2021] apixaban  5 mg Oral BID    acyclovir  400 mg Oral BID    traZODone  150 mg Oral Nightly    cyproheptadine  8 mg Oral Nightly     Continuous Infusions:   sodium chloride       PRN Meds:morphine, sodium chloride flush, sodium chloride, ondansetron **OR** ondansetron, polyethylene glycol, acetaminophen **OR** acetaminophen, potassium chloride **OR** potassium alternative oral replacement **OR** potassium chloride, magnesium sulfate, ketorolac, HYDROcodone 5 mg - acetaminophen    Review of Systems    This morning said he is in lots of pain on his left and right chest.  Very painful to take a deep breath.  Overnight did not require any pain medication    Objective:     Vitals reviewed.  I/O last 3 completed shifts:  In: 426.9 [P.O.:350; I.V.:76.9]  Out: -   No intake/output data recorded.      BP 119/80   Pulse 98   Temp 100 F (37.8 C) (Axillary)   Resp 24   Ht 5\' 7"  (1.702 m)   Wt 221 lb 1.9 oz (100.3 kg)   SpO2 92%   BMI 34.63 kg/m     General: Alert and oriented,  laying in bed clutching his side  Eye: Normal conjuctiva.  HENT: Moist oral mucosa.  Neck: Supple nontender.  Lungs: Clear to auscultation on room air, painful when breathing, taking small breaths  Heart: Normal rate, regular rhythm, no murmur.  Abdomen: Soft, non-tender, non-distended, normal bowel sounds.  Musculoskeletal: Foot and ankle  immobilizer on left leg.  Skin: Skin is warm and dry.  Neurologic: Awake alert and orient x3.  Psychiatric: Cooperative, appropriate mood and affect.    Labs:  CBC:  Recent Labs     06/24/21  0129 06/25/21  0539   WBC 8.4 6.7   RBC 4.70 4.31   HGB 12.5* 11.6*   HCT 39.3 36.2*   MCV 83.6* 84.0   RDW 13.8 13.8   PLT 195 216     CHEMISTRIES:  Recent Labs     06/24/21  0129 06/25/21  0539   NA 136 138   K 3.5 3.4*   CL 97* 99   CO2 25 28   BUN 12 15   CREATININE 1.0 1.0   GLUCOSE 112* 133*   MG  --  2.1     PT/INR:No results for input(s): PROTIME, INR in the last 72 hours.  APTT:  Recent Labs     06/24/21  0406   APTT 29.7     LIVER PROFILE:  Recent Labs     06/24/21  0129 06/25/21  0539   AST 59* 35   ALT 133* 93*   BILIDIR  --  <  0.20   BILITOT 0.71 0.33   ALKPHOS 182* 162*         ABGs: No results found for: PH, PO2, PCO2  Radiology review: CTA Chest W/WO Contrast PE Eval    Result Date: 06/24/2021  CTA chest: 06/24/21 INDICATION: PE shortness of breath, chest pain COMPARISON: CTA 06/20/2021 TECHNIQUE: PE protocol (Postcontrast imaging from the thoracic inlet through the  hemidiaphragms in the pulmonary arterial phase. Axial 5x5 mm soft tissue and lung 2x2 mm images. Multiplanar 3-D volumetric MIP reconstructions through the pulmonary arteries per protocol.) CT scanning was performed using radiation dose  reduction techniques when appropriate, per system protocols. Pulmonary arteries: Subtle pulmonary embolus involving the right and left main pulmonary arteries. There is clot extension into lobar, segmental and subsegmental branches bilaterally. Overall, the clot burden appears similar to prior CT. Lungs/Airways: There are small subpleural consolidations in the basal lower lobes bilaterally, which are new from prior exam. 6 mm nodule in the right middle lobe. Pleura: No pleural effusion or pneumothorax. Lymph Nodes: No mediastinal, hilar, or axillary adenopathy. Cardiovascular: Normal heart size. No pericardial effusion.  RV:LV ratio is less than 1. There is very mild flattening of the interventricular septum. Osseous structures: No suspicious lytic or blastic osseous lesions. Upper Abdomen: Unremarkable.     Saddle pulmonary embolus extending into the right and left main pulmonary arteries with clot extension into the lobar, segmental and subsegmental branches  bilaterally. Overall, the clot burden is similar in appearance to CT from 06/20/2021. Mild flattening of the interventricular septum, which could represent right heart strain. RV to LV ratio is less than 1. Interval development of small pulmonary infarcts in the lower lobes. 6 mm right middle lobe nodule, which appears slightly smaller when compared to recent prior CT, and could be inflammatory or infectious.       Assessment:     Principal Problem:    Pulmonary embolism, other, unspecified chronicity, unspecified whether acute cor pulmonale present (HCC)  Resolved Problems:    * No resolved hospital problems. *      Plan:     Chest pain-due to pleuritic pain from pulmonary infarctions.  Did not require any pain meds overnight but is in lots of pain this morning, nursing has given Toradol and morphine, will give an additional dose of morphine and encourage use of the oral narcotics.  Continue tylenol. We have also added back to his home Neurontin    Saddle pulmonary embolism (provoked from surgery and immobility)-was started on heparin drip on admission we will transition this back to Eliquis.  Echocardiogram last admission showed normal EF and no RV strain.     Pulmonary nodule-right middle lobe nodule seen on CT with also emphysema.  Will need outpatient follow-up     DVT-last admission had lower extremity Doppler that showed left femoral vein DVT, treatment as above    Hypertension-on hydrochlorothiazide and Cozaar at home.  Systolic blood pressure now in the 100s we will restart when blood pressure more consistently elevated     Hyperlipidemia-on atorvastatin at home LFTs  mildly elevated we will continue to trend and hold statin for now     Anxiety-on Cymbalta at home we will restart, trazodone for sleep    Gastric ulcer history-patient patient does have a history of gastric ulcer and GI bleed, he has been on Protonix we will add back his home Carafate    DVT prophylaxis-Eliquis  GI prophylaxis-Protonix     GOC: Full code

## 2021-06-25 NOTE — Progress Notes (Signed)
Acute Care Physical Therapy Treatment Note  Inpatient   (PTA/PT Visit Days : 2)  Time In: 0160 Time Out: 1330 (16 minutes)  Acknowledge Orders  PT Charge Capture    Admitting Diagnosis:  Pulmonary embolism, other, unspecified chronicity, unspecified whether acute cor pulmonale present ([place]) [I26.99]      Treatment Diagnosis:     Payor: [employer] / Plan: [employer] / Product Type: *No Product type* /   SUBJECTIVE   Baylen Buckner is a 51 y.o. male admitted with Pulmonary embolism, other, unspecified chronicity, unspecified whether acute cor pulmonale present ([place]). He  has a past medical history of Anxiety, Arthritis, BMI 32.0-32.9,adult, COVID, Depression, Fibromyalgia, Generalized headache, GERD (gastroesophageal reflux disease), Herpes, Hiatal hernia, Hyperlipidemia, Hypertension, Left ankle instability, Seasonal allergies, Unstable ankle, left, and Wears glasses.  He also  has a past surgical history that includes Foot surgery (Left); Toe Surgery (2019); Ankle surgery (Left, 06/28/2019); Endoscopy, colon, diagnostic (04/09/2021); Ankle surgery (Left, 04/30/2021); and Colonoscopy.    Subjective: AGREEABLE TO TREATMENT. STATES HE CAN NOT PLACE WEIGHT ON LEFT FOOT/ANKLE SECONDARY TO PAIN (ALTHOUGH ORDER FOR WBAT). RN STATED OK TO WORK WITH PATIENT    Additional Pertinent History: Additional Pertinent Hx: Pt admitted with new finding of saddle PE, chest pain, SOB. Pt h/o extensive L ankle sx 04/30/21 now WBAT c boot donned.      Social Environment Prior Level of Function   Lives With: Spouse  Type of Home: House  Home Layout: One level  Home Access: Stairs to enter without rails  Bathroom Equipment: Civil engineer, contracting  Home Equipment: Crutches, Government social research officer Help From: Family  ADL Assistance: Needs assistance  Homemaking Assistance: Needs assistance  Ambulation Assistance: Independent  Active Driver: No   History of Falls:      Comments:        OBJECTIVE     Vital Signs/Pain Lines/Drains/Precautions   Vital Signs  ROOM  AIR - SATS 92-95% UPON RETURNING TO CHAIR, DID NOT DECREASE WHILE SITTING IN CHAIR    Pain  BILATERAL CHEST PAIN - RN PRESENT PRIOR TO TREATMENT AND FULLY AWARE - PATIENT GIVEN PAIN MEDS Peripheral IV 06/24/21 Right Antecubital (Active)       Peripheral IV 06/24/21 Right;Anterior Forearm (Active)       Precautions/Restrictions  Restrictions/Precautions  Restrictions/Precautions: Weight Bearing  Lower Extremity Weight Bearing Restrictions  Left Lower Extremity Weight Bearing: Weight Bearing As Tolerated (c walking boot)     Orientation/Cognition Vision/Hearing   Cognition  Overall Cognitive Status: WNL  Orientation  Overall Orientation Status: Within Normal Limits       [facility] Home	[place] AM-PACT "6 Clicks" Basic Mobility Inpatient Short Form   How much help is needed turning from your back to your side while in a flat bed without using bedrails?: None  How much help is needed moving from lying on your back to sitting on the side of a flat bed without using bedrails?: None  How much help is needed moving to and from a bed to a chair?: None  How much help is needed standing up from a chair using your arms?: None  How much help is needed walking in hospital room?: None  How much help is needed climbing 3-5 steps with a railing?: A Little  AM-PAC Inpatient Mobility Raw Score : 23  AM-PAC Inpatient T-Scale Score : 56.93  Mobility Inpatient CMS 0-100% Score: 11.2  Mobility Inpatient CMS G-Code Modifier : CI        TREATMENT  Transfer Training   Sit to Stand: Stand by assistance     Ambulation   Surface: Level tile  Device: Axillary Crutches  Assistance: Stand by assistance;Contact guard assistance (FOR SAFETY ONLY)  Quality of Gait: swing-to gait, good balance, cues for decreased speed to improve endurance.  Distance: 120  Comments: SATS 92-95% UPON RETURN TO CHAIR(NO DECREASE AFTER SITTING A FEW MINUTES)     Stair Training     VERBALLY REVIEWED STAIR TRAINING; PATIENT DEMONSTRATED PROPERLY     Comments   PROGRESSING  WELL; APPROACHING BASELINE FUNCTIONALLY WITH CRUTCHES; NO UNSTEADINESS OR LOB     Safety: Type of Devices: All fall risk precautions in place;Call light within reach;Left in chair;Nurse notified     Education:      ASSESSMENT   Assessment:    PATIENT PATIENT SITTING UP IN CHAIR UPON ARRIVAL ON ROOM AIR, TALKING ON THE PHONE. RN PRESENT STATING OK TO WORK WITH PATIENT. SIT TO STAND WITH GOOD SAFETY USING CRUTCHES AND HAND PLACEMENT, SBA. GAIT TRAINING WITH AXILLARY CRUTCHES X 120 FT CGA/ SBA, NO UNSTEADINESS. PATIENT MAINTAINING NWB/ TTWB PER HIS REQUEST SECONDARY TO PAIN. STATES HE CAN NOT TOLERATE WEIGHT ON LEFT FOOT/ANKLE. (ORDER FOR WBAT) VERBAL CUES TO SLOW PACE FOR SAFETY AND ENDURANCE. RETURNED TO ROOM AND SEATED IN CHAIR. 02 SATS 92-95%. PATIENT NOT VISIBLY SOB. 02 SATS DID NOT DECREASE AFTER RETURNING TO CHAIR. DISCUSSED ALL THE ABOVE WITH RN. POSITIONED IN CHAIR WITH LE'S ELEVATED. LINES IN TACT. CALL BELL IN HAND. NO ALARM PRIOR. NEEDS IN REACH.  REVIEWED STAIRS WITH PATIENT AND ABLE TO DEMO AND RECITE PROPER TECHNIQUE.       POST ACUTE RECOMMENDATIONS   Setting: Home Health Therapy  PATIENT STATES HE WAS SCHEDULED TO BEGIN OPPT FOR ANKLE REHAB THIS WEEK    Justification for Recommended Setting: Recommended to help patient obtain maximum functional independence.     Equipment:   PATIENT HAS AXILLARY CRUTCHES    Discharge Transportation Recommendations: No stretcher     PLAN   Frequency & Duration: 3 times/week for duration of hospital stay or until stated goals are met, whichever comes first.    Interventions Planned (Treatment may consist of any combination of the following):  Current Treatment Recommendations: Balance training; Functional mobility training; Transfer training; Endurance training; Conservation officer, nature, education, Visual merchandiser; Patient/Caregiver education & training; Safety education & training; Home exercise program; Therapeutic activities  .    Goals  STG 1  MET  Short Term Goals Long Term Goals   Time Frame for Short Term Goals: 5 visits  Short Term Goal 1: Pt will amb 100 ft c LRAD c SBA c sats >90%  Short Term Goal 2: Pt will report modified BORG scale 1/10 following amb 100 ft c LRAD Time Frame for Long Term Goals : 10 visits  Long Term Goal 1: Pt will amb 150 ft c LRAD c SBA c sats >90%.      Therapist Signature: Otis Peak, PTA    Date: 06/25/2021

## 2021-06-26 LAB — BASIC METABOLIC PANEL
Anion Gap: 9 mmol/L (ref 2–17)
BUN: 18 mg/dL (ref 6–20)
CO2: 29 mmol/L (ref 22–29)
Calcium: 8.8 mg/dL (ref 8.6–10.0)
Chloride: 102 mmol/L (ref 98–107)
Creatinine: 1.1 mg/dL (ref 0.7–1.3)
Est, Glom Filt Rate: 82 mL/min/1.73m?? (ref >=60)
Glucose: 93 mg/dL (ref 70–99)
Osmolaliy Calculated: 281 mosm/kg (ref 270–287)
Potassium: 3.9 mmol/L (ref 3.5–5.3)
Sodium: 140 mmol/L (ref 135–145)

## 2021-06-26 LAB — CBC WITH AUTO DIFFERENTIAL
Basophils %: 0.3 % (ref 0.0–2.0)
Basophils Absolute: 0 10*3/uL (ref 0.0–0.2)
Eosinophils %: 3.1 % (ref 0.0–7.0)
Eosinophils Absolute: 0.2 10*3/uL (ref 0.0–0.5)
Hematocrit: 34.3 % — ABNORMAL LOW (ref 38.0–52.0)
Hemoglobin: 10.9 g/dL — ABNORMAL LOW (ref 13.0–17.3)
Immature Grans (Abs): 0.03 10*3/uL (ref 0.00–0.06)
Immature Granulocytes %: 0.5 % (ref 0.0–0.6)
Lymphocytes Absolute: 1.3 10*3/uL (ref 1.0–3.2)
Lymphocytes: 20.6 % (ref 15.0–45.0)
MCH: 27 pg (ref 27.0–34.5)
MCHC: 31.8 g/dL — ABNORMAL LOW (ref 32.0–36.0)
MCV: 84.9 fL (ref 84.0–100.0)
MPV: 10.9 fL (ref 7.2–13.2)
Monocytes %: 12.8 % — ABNORMAL HIGH (ref 4.0–12.0)
Monocytes Absolute: 0.8 10*3/uL (ref 0.3–1.0)
Neutrophils %: 62.7 % (ref 42.0–74.0)
Neutrophils Absolute: 3.9 10*3/uL (ref 1.6–7.3)
Platelets: 248 10*3/uL (ref 140–440)
RBC: 4.04 x10e6/mcL (ref 4.00–5.60)
RDW: 14 % (ref 11.0–16.0)
WBC: 6.2 10*3/uL (ref 3.8–10.6)

## 2021-06-26 LAB — EKG 12-LEAD
P Axis: 43 degrees
P-R Interval: 187 ms
Q-T Interval: 368 ms
QRS Duration: 92 ms
QTc Calculation (Bazett): 451 ms
R Axis: 52 degrees
T Axis: 50 degrees
Ventricular Rate: 90 {beats}/min

## 2021-06-26 LAB — MAGNESIUM: Magnesium: 2.1 mg/dL (ref 1.6–2.6)

## 2021-06-26 MED ORDER — ONDANSETRON 4 MG PO TBDP
4 MG | ORAL_TABLET | Freq: Three times a day (TID) | ORAL | 0 refills | Status: AC | PRN
Start: 2021-06-26 — End: 2021-07-06

## 2021-06-26 MED ORDER — PANTOPRAZOLE SODIUM 40 MG PO TBEC
40 MG | ORAL_TABLET | Freq: Every day | ORAL | 2 refills | Status: AC
Start: 2021-06-26 — End: 2021-07-27

## 2021-06-26 MED ORDER — SENNA-DOCUSATE SODIUM 8.6-50 MG PO TABS
Freq: Every evening | ORAL | Status: DC
Start: 2021-06-26 — End: 2021-06-26

## 2021-06-26 MED ORDER — METHOCARBAMOL 750 MG PO TABS
750 MG | Freq: Four times a day (QID) | ORAL | Status: DC | PRN
Start: 2021-06-26 — End: 2021-06-26
  Administered 2021-06-26: 15:00:00 750 mg via ORAL

## 2021-06-26 MED ORDER — SENNOSIDES-DOCUSATE SODIUM 8.6-50 MG PO TABS
Freq: Every evening | ORAL | Status: DC
Start: 2021-06-26 — End: 2021-06-26

## 2021-06-26 MED ORDER — POLYETHYLENE GLYCOL 3350 17 G PO PACK
17 g | Freq: Every day | ORAL | 1 refills | Status: AC | PRN
Start: 2021-06-26 — End: 2021-07-26

## 2021-06-26 MED ORDER — HYDROCODONE-ACETAMINOPHEN 10-325 MG PO TABS
10-325 MG | Freq: Four times a day (QID) | ORAL | Status: DC | PRN
Start: 2021-06-26 — End: 2021-06-26
  Administered 2021-06-26 (×2): 1 via ORAL

## 2021-06-26 MED ORDER — METHOCARBAMOL 750 MG PO TABS
750 MG | ORAL_TABLET | Freq: Three times a day (TID) | ORAL | 0 refills | Status: AC | PRN
Start: 2021-06-26 — End: 2021-07-10

## 2021-06-26 MED ORDER — HYDROCODONE-ACETAMINOPHEN 10-325 MG PO TABS
10-325 MG | ORAL_TABLET | Freq: Four times a day (QID) | ORAL | 0 refills | Status: AC | PRN
Start: 2021-06-26 — End: 2021-07-06

## 2021-06-26 MED ORDER — SENNA-DOCUSATE SODIUM 8.6-50 MG PO TABS
Freq: Once | ORAL | Status: AC
Start: 2021-06-26 — End: 2021-06-26
  Administered 2021-06-26: 13:00:00 1 via ORAL

## 2021-06-26 MED ORDER — BISACODYL 10 MG RE SUPP
10 MG | Freq: Every day | RECTAL | Status: DC | PRN
Start: 2021-06-26 — End: 2021-06-26

## 2021-06-26 MED FILL — DULOXETINE HCL 30 MG PO CPEP: 30 MG | ORAL | Qty: 2

## 2021-06-26 MED FILL — ACYCLOVIR 200 MG PO CAPS: 200 MG | ORAL | Qty: 2

## 2021-06-26 MED FILL — NORMAL SALINE FLUSH 0.9 % IV SOLN: 0.9 % | INTRAVENOUS | Qty: 30

## 2021-06-26 MED FILL — SUCRALFATE 1 G PO TABS: 1 GM | ORAL | Qty: 1

## 2021-06-26 MED FILL — METHOCARBAMOL 750 MG PO TABS: 750 MG | ORAL | Qty: 1

## 2021-06-26 MED FILL — CYPROHEPTADINE HCL 4 MG PO TABS: 4 MG | ORAL | Qty: 2

## 2021-06-26 MED FILL — NORMAL SALINE FLUSH 0.9 % IV SOLN: 0.9 % | INTRAVENOUS | Qty: 20

## 2021-06-26 MED FILL — HYDROCODONE-ACETAMINOPHEN 5-325 MG PO TABS: 5-325 MG | ORAL | Qty: 1

## 2021-06-26 MED FILL — ELIQUIS 5 MG PO TABS: 5 MG | ORAL | Qty: 2

## 2021-06-26 MED FILL — GABAPENTIN 300 MG PO CAPS: 300 MG | ORAL | Qty: 2

## 2021-06-26 MED FILL — NORMAL SALINE FLUSH 0.9 % IV SOLN: 0.9 % | INTRAVENOUS | Qty: 10

## 2021-06-26 MED FILL — TRAZODONE HCL 50 MG PO TABS: 50 MG | ORAL | Qty: 3

## 2021-06-26 MED FILL — PANTOPRAZOLE SODIUM 40 MG PO TBEC: 40 MG | ORAL | Qty: 1

## 2021-06-26 MED FILL — SENNA S 8.6-50 MG PO TABS: ORAL | Qty: 1

## 2021-06-26 MED FILL — KETOROLAC TROMETHAMINE 15 MG/ML IJ SOLN: 15 MG/ML | INTRAMUSCULAR | Qty: 1

## 2021-06-26 MED FILL — HYDROCODONE-ACETAMINOPHEN 10-325 MG PO TABS: 10-325 MG | ORAL | Qty: 1

## 2021-06-26 MED FILL — MORPHINE SULFATE 4 MG/ML IJ SOLN: 4 mg/mL | INTRAMUSCULAR | Qty: 1

## 2021-06-26 NOTE — Discharge Summary (Signed)
..Discharge Summary     Patient Identification:  Ronald Herrera  DOB: 05/15/1970  MRN: [id number]   Account: [id number]     Admit date: 06/24/2021  Discharge date: Jun 26, 2021   Attending provider: Suann Larry Siriphand,*        Primary care provider: None None     Discharge Diagnoses:   Principal Problem:    Pulmonary embolism, other, unspecified chronicity, unspecified whether acute cor pulmonale present [facility])  Resolved Problems:    * No resolved hospital problems. *       Hospital Course:     # Saddle pulmonary embolism   # Acute pulmonary infarcts   # Femoral vein DVT   Admitted on May 10.    Patient was discharged from [facility] on May 8. (May 6th -May 8th)  Patient initially presented with LLE edema and tenderness (s/p recent ankle fusion); found to have D-dimer of 11 and left femoral vein DVT   Was on heparin drip at [facility] and converted to Eliquis.  No intervention and the patient was hemodynamically stable with negative troponins and normal BNP  Continue to have pain at home.  Felt stabbing sensation.  Represented to the emergency room.  CTA chest (5/10): extensive bilateral PE, with saddle embolus at level of left and right pulmonary artery bifurcation.    Mild right heart strain as evidence of interventricular septum flattening.   Mild bilateral posterior lower lung atelectasis (OR developing pulmonary infarcts).   Patient remains hemodynamically stable, without tachycardia, hypotension, nor hypoxia   ANA ordered on prior admission- negative.   Severe provoked deep venous thrombosis with pulmonary emboli secondary to ankle surgery on April 30, 2021  Patient was on aspirin 325 mg p.o. daily for DVT prophylaxis prior to admission      Discussed with pulmonary/critical care.  full dose Eliquis 10 mg p.o. twice daily.  For 1 Week.  We will continue Eliquis 10 mg p.o. twice daily through and including May 15 and then convert to 5 mg p.o. twice daily.  Patient will follow-up with primary care  physician.  Chief extensive deep venous thrombosis with pulmonary emboli.  Would suggest at least 6 months of therapy though in light of significant pulmonary emboli.  Will need home health with home PT and OT.     # Essential hypertension   Appears to be prescribed losartan;   though not recently dispensed   Currently normotensive   -Will hold outpatient antihypertensives given tenuous hemodynamic status      # Coronary calcifications  CT angiogram demonstrating left anterior descending calcifications.    EKG  without signs of ischemic pathology   -Patient instructed to follow-up with cardiology as an outpatient for ischemic testing .     # Pulmonary nodule   53m non-calcified nodule in the right middle lobe; incidentally noted on CTA chest  -Outpatient surveillance with PCP      #Status post left ankle surgery on April 30, 2021/Dr. Lamb   s/p left ankle arthroscopy, microfracture OCD lesion, lateral ligament reconstruction, peroneal tendon debridement, superficial peroneal nerve neurolysis  Will let surgical team know about patient's readmission/admission.     DVT prophylaxis: Anticoagulation with full dose Eliquis   Dispo: Is to go home.  Code status: Full Code   Medical Decision maker: Self and wife  PCP: None None      This note was created using voice recognition software and may contain typographic errors missed during final review. The intent is  to have a complete and accurate medical record.  As a valued partner in this safety effort, if you have noted factual errors, please complete the Health Information Amendment/Correct Form or call the [place] [employer] at [phone].     For any questions, please contact Team B                 Discharge Medications:     Medication List        START taking these medications      * apixaban 5 MG Tabs tablet  Commonly known as: Eliquis  Take 2 tablets by mouth 2 times daily for 7 days, THEN 1 tablet 2 times daily for 24 days.  Start taking on:  Jun 22, 2021     * apixaban 5 MG Tabs tablet  Commonly known as: ELIQUIS  Take 1 tablet by mouth 2 times daily  Start taking on: July 20, 2021     HYDROcodone-acetaminophen 10-325 MG per tablet  Commonly known as: NORCO  Take 1 tablet by mouth every 6 hours as needed for Pain for up to 10 days. Max Daily Amount: 4 tablets     methocarbamol 750 MG tablet  Commonly known as: ROBAXIN  Take 1 tablet by mouth 3 times daily as needed (pain/muscle spasm)     ondansetron 4 MG disintegrating tablet  Commonly known as: ZOFRAN-ODT  Take 1 tablet by mouth every 8 hours as needed for Nausea or Vomiting     polyethylene glycol 17 g packet  Commonly known as: GLYCOLAX  Take 17 g by mouth daily as needed for Constipation           * This list has 2 medication(s) that are the same as other medications prescribed for you. Read the directions carefully, and ask your doctor or other care provider to review them with you.                CHANGE how you take these medications      pantoprazole 40 MG tablet  Commonly known as: PROTONIX  Take 1 tablet by mouth every morning (before breakfast)  Start taking on: Jun 27, 2021  What changed: when to take this            CONTINUE taking these medications      acetaminophen 500 MG tablet  Commonly known as: TYLENOL     acyclovir 400 MG tablet  Commonly known as: ZOVIRAX     atorvastatin 40 MG tablet  Commonly known as: LIPITOR     cetirizine 10 MG tablet  Commonly known as: ZYRTEC     cyproheptadine 4 MG tablet  Commonly known as: PERIACTIN     DULoxetine 60 MG extended release capsule  Commonly known as: CYMBALTA     ergocalciferol 1.25 MG (50000 UT) capsule  Commonly known as: ERGOCALCIFEROL     gabapentin 300 MG capsule  Commonly known as: NEURONTIN  Take 2 capsules by mouth 3 times daily for 30 days.     naloxone 4 MG/0.1ML Liqd nasal spray  Commonly known as: Narcan  1 spray by Nasal route as needed for Opioid Reversal     olopatadine 0.1 % ophthalmic solution  Commonly known as: PATANOL      sucralfate 1 GM tablet  Commonly known as: CARAFATE     tadalafil 20 MG tablet  Commonly known as: CIALIS     therapeutic multivitamin-minerals tablet     traZODone 100 MG tablet  Commonly known as: DESYREL     vitamin C 250 MG tablet            STOP taking these medications      hydroCHLOROthiazide 25 MG tablet  Commonly known as: HYDRODIURIL     losartan 100 MG tablet  Commonly known as: COZAAR     oxyCODONE 5 MG immediate release tablet  Commonly known as: ROXICODONE               Where to Get Your Medications        These medications were sent to [facility] [phone]  [address], [place] [address]      Phone: [phone]   HYDROcodone-acetaminophen 10-325 MG per tablet  methocarbamol 750 MG tablet  ondansetron 4 MG disintegrating tablet  pantoprazole 40 MG tablet  polyethylene glycol 17 g packet         Patient Instructions:      Activity: activity as tolerated and no driving for today  Diet: ADULT DIET; Regular    Code Status: Full Code    Follow-up visits:   None None    Follow up in 2 week(s)      Lew Dawes, MD  [address]  [place] [address]  [phone]    Follow up  As needed, If symptoms worsen       Procedures: none    Consults:   pulmonary/intensive care and orthopedic surgery    Examination:  Vitals:  Vitals:    06/26/21 1028 06/26/21 1058 06/26/21 1205 06/26/21 1246   BP: 124/79 116/77 114/72    Pulse: 98 (!) 101 96    Resp: 13 11 12     Temp:    98.9 F (37.2 C)   TempSrc:    Oral   SpO2: 95% 95% 97%    Weight:       Height:         Weight: Weight - Scale: 225 lb 1.4 oz (102.1 kg)     24 hour intake/output:  Intake/Output Summary (Last 24 hours) at 06/26/2021 1409  Last data filed at 06/26/2021 1006  Gross per 24 hour   Intake 10 ml   Output 550 ml   Net -540 ml       Physical Exam     Pupils equal and reactive to light.  Extraocular muscles are intact.  Oropharynx clear  Neck is  supple  Lungs are clear to auscultation  Decreased breath sounds throughout.  Patient with significant guarding.  Normal S1-S2 without murmur rub or gallop  Abdomen soft nontender  No cyanosis, clubbing, trace edema on left chronic.  Right lower extremity no edema.  Alert and oriented x3 and nonfocal.        Significant Diagnostics:   Radiology: CTA Chest W/WO Contrast PE Eval    Result Date: 06/24/2021  CTA chest: 06/24/21 INDICATION: PE shortness of breath, chest pain COMPARISON: CTA 06/20/2021 TECHNIQUE: PE protocol (Postcontrast imaging from the thoracic inlet through the  hemidiaphragms in the pulmonary arterial phase. Axial 5x5 mm soft tissue and lung 2x2 mm images. Multiplanar 3-D volumetric MIP reconstructions through the pulmonary arteries per protocol.) CT scanning was performed using radiation dose  reduction techniques when appropriate, per system protocols. Pulmonary arteries: Subtle pulmonary embolus involving the right and left main pulmonary arteries. There is clot extension into lobar, segmental and subsegmental  branches bilaterally. Overall, the clot burden appears similar to prior CT. Lungs/Airways: There are small subpleural consolidations in the basal lower lobes bilaterally, which are new from prior exam. 6 mm nodule in the right middle lobe. Pleura: No pleural effusion or pneumothorax. Lymph Nodes: No mediastinal, hilar, or axillary adenopathy. Cardiovascular: Normal heart size. No pericardial effusion. RV:LV ratio is less than 1. There is very mild flattening of the interventricular septum. Osseous structures: No suspicious lytic or blastic osseous lesions. Upper Abdomen: Unremarkable.     Saddle pulmonary embolus extending into the right and left main pulmonary arteries with clot extension into the lobar, segmental and subsegmental branches  bilaterally. Overall, the clot burden is similar in appearance to CT from 06/20/2021. Mild flattening of the interventricular septum, which could represent  right heart strain. RV to LV ratio is less than 1. Interval development of small pulmonary infarcts in the lower lobes. 6 mm right middle lobe nodule, which appears slightly smaller when compared to recent prior CT, and could be inflammatory or infectious.    XR CHEST 1 VIEW    Result Date: 06/25/2021  Chest AP: 06/25/21 INDICATION: resp failure I26.99,Other pulmonary embolism without acute cor pulmonale,ICD-10-CM COMPARISON: CT 06/24/2021, radiograph 06/21/2021 FINDINGS: Very low volumes. No pneumothorax. Vascular crowding. No lobar consolidation. No overt edema.     Very low volumes. Bibasilar atelectasis versus scarring seen on CT is not apparent by low volume AP technique.      Labs:   Recent Results (from the past 72 hour(s))   EKG 12 Lead    Collection Time: 06/24/21 12:18 AM   Result Value Ref Range    Ventricular Rate 90 BPM    P-R Interval 187 ms    QRS Duration 92 ms    Q-T Interval 368 ms    QTc Calculation (Bazett) 451 ms    P Axis 43 degrees    R Axis 52 degrees    T Axis 50 degrees    Diagnosis       SINUS RHYTHM  NORMAL ECG  COMPARED TO PRIOR EKG IS NOW NORMAL  Confirmed by Runquist-MD, Lacey Jensen. ([id number] on 06/26/2021 8:56:33 AM     CBC with Auto Differential    Collection Time: 06/24/21  1:29 AM   Result Value Ref Range    WBC 8.4 3.8 - 10.6 x10e3/mcL    RBC 4.70 4.00 - 5.60 x10e6/mcL    Hemoglobin 12.5 (L) 13.0 - 17.3 g/dL    Hematocrit 39.3 38.0 - 52.0 %    MCV 83.6 (L) 84.0 - 100.0 fL    MCH 26.6 (L) 27.0 - 34.5 pg    MCHC 31.8 (L) 32.0 - 36.0 g/dL    RDW 13.8 11.0 - 16.0 %    Platelets 195 140 - 440 x10e3/mcL    MPV 10.9 7.2 - 13.2 fL   Comprehensive Metabolic Panel    Collection Time: 06/24/21  1:29 AM   Result Value Ref Range    Sodium 136 135 - 145 mmol/L    Potassium 3.5 3.5 - 5.3 mmol/L    Chloride 97 (L) 98 - 107 mmol/L    CO2 25 22 - 29 mmol/L    Glucose 112 (H) 70 - 99 mg/dL    BUN 12 6 - 20 mg/dL    Creatinine 1.0 0.7 - 1.3 mg/dL    Anion Gap 14 2 - 17 mmol/L    OSMOLALITY CALCULATED 272 270 -  287 mOsm/kg    Calcium 9.3 8.6 -  10.0 mg/dL    Total Protein 7.9 6.4 - 8.3 g/dL    Albumin 3.8 3.5 - 5.2 g/dL    Globulin 4.2 1.9 - 4.4 g/dL    Albumin/Globulin Ratio 1.00 1.00 - 2.70    Total Bilirubin 0.71 0.00 - 1.20 mg/dL    Alk Phosphatase 182 (H) 40 - 130 unit/L    AST 59 (H) 0 - 50 unit/L    ALT 133 (H) 0 - 50 unit/L    Est, Glom Filt Rate 92 >=60 mL/min/1.39m  Brain Natriuretic Peptide    Collection Time: 06/24/21  1:29 AM   Result Value Ref Range    NT Pro-BNP <50 0 - 125 pg/mL   Lactic Acid    Collection Time: 06/24/21  1:29 AM   Result Value Ref Range    Lactic Acid 0.9 0.5 - 2.0 mmol/L   Troponin    Collection Time: 06/24/21  1:29 AM   Result Value Ref Range    Troponin T <0.010 0.000 - 0.010 ng/mL   Manual Differential    Collection Time: 06/24/21  2:23 AM   Result Value Ref Range    Neutrophils %. Manual count 62 42 - 74 %    Lymphocytes 28 15 - 45 %    Monocytes 6 4 - 12 %    Basophils % 1 0 - 2 %    Reactive Lymphocytes 3 %    Neutrophils Absolute 5.2 1.6 - 7.3 x10e3/mcL    Absolute Lymph # 2.4 1.0 - 3.2 x10e3/mcL    Absolute Mono # 0.5 0.3 - 1.0 x10e3/mcL    Absolute Baso # 0.1 0.0 - 0.2 x10e3/mcL    Lymphs, Absolute Count, Reactive 0.3 x10e3/mcL    Platelet Estimate Not Indicated     RBC Morphology Not Indicated Normal   APTT    Collection Time: 06/24/21  4:06 AM   Result Value Ref Range    PTT 29.7 23.3 - 34.5 seconds   Troponin    Collection Time: 06/24/21 12:48 PM   Result Value Ref Range    Troponin T <0.010 0.000 - 0.010 ng/mL   Troponin    Collection Time: 06/24/21  6:28 PM   Result Value Ref Range    Troponin T <0.010 0.000 - 0.010 ng/mL   Troponin    Collection Time: 06/25/21 12:56 AM   Result Value Ref Range    Troponin T <0.010 0.000 - 0.010 ng/mL   Basic Metabolic Panel    Collection Time: 06/25/21  5:39 AM   Result Value Ref Range    Sodium 138 135 - 145 mmol/L    Potassium 3.4 (L) 3.5 - 5.3 mmol/L    Chloride 99 98 - 107 mmol/L    CO2 28 22 - 29 mmol/L    Glucose 133 (H) 70 - 99  mg/dL    BUN 15 6 - 20 mg/dL    Creatinine 1.0 0.7 - 1.3 mg/dL    Anion Gap 11 2 - 17 mmol/L    OSMOLALITY CALCULATED 278 270 - 287 mOsm/kg    Calcium 8.6 8.6 - 10.0 mg/dL    Est, Glom Filt Rate 92 >=60 mL/min/1.735m CBC with Auto Differential    Collection Time: 06/25/21  5:39 AM   Result Value Ref Range    WBC 6.7 3.8 - 10.6 x10e3/mcL    RBC 4.31 4.00 - 5.60 x10e6/mcL    Hemoglobin 11.6 (L) 13.0 - 17.3 g/dL    Hematocrit 36.2 (L)  38.0 - 52.0 %    MCV 84.0 84.0 - 100.0 fL    MCH 26.9 (L) 27.0 - 34.5 pg    MCHC 32.0 32.0 - 36.0 g/dL    RDW 13.8 11.0 - 16.0 %    Platelets 216 140 - 440 x10e3/mcL    MPV 10.9 7.2 - 13.2 fL    Neutrophils % 65.4 42.0 - 74.0 %    Lymphocytes 22.8 15.0 - 45.0 %    Monocytes 8.9 4.0 - 12.0 %    Eosinophils % 2.3 0.0 - 7.0 %    Basophils % 0.3 0.0 - 2.0 %    Neutrophils Absolute 4.4 1.6 - 7.3 x10e3/mcL    Absolute Lymph # 1.5 1.0 - 3.2 x10e3/mcL    Absolute Mono # 0.6 0.3 - 1.0 x10e3/mcL    Absolute Eos # 0.2 0.0 - 0.5 x10e3/mcL    Absolute Baso # 0.0 0.0 - 0.2 x10e3/mcL    Immature Granulocytes 0.3 0.0 - 0.6 %    Immature Grans (Abs) 0.02 0.00 - 0.06 x10e3/mcL   Hepatic Function Panel    Collection Time: 06/25/21  5:39 AM   Result Value Ref Range    Total Protein 7.0 6.4 - 8.3 g/dL    Albumin 3.7 3.5 - 5.2 g/dL    Total Bilirubin 0.33 0.00 - 1.20 mg/dL    Alk Phosphatase 162 (H) 40 - 130 unit/L    Bilirubin, Direct <0.20 0.00 - 0.30 mg/dL    AST 35 0 - 50 unit/L    ALT 93 (H) 0 - 50 unit/L   Magnesium    Collection Time: 06/25/21  5:39 AM   Result Value Ref Range    Magnesium 2.1 1.6 - 2.6 mg/dL   Lactic Acid    Collection Time: 06/25/21  5:39 AM   Result Value Ref Range    Lactic Acid 1.1 0.5 - 2.0 mmol/L   Brain Natriuretic Peptide    Collection Time: 06/25/21  5:39 AM   Result Value Ref Range    NT Pro-BNP <36 0 - 125 pg/mL   Troponin    Collection Time: 06/25/21  5:39 AM   Result Value Ref Range    Troponin T <0.010 0.000 - 0.010 ng/mL   Basic Metabolic Panel    Collection Time:  06/26/21  5:06 AM   Result Value Ref Range    Sodium 140 135 - 145 mmol/L    Potassium 3.9 3.5 - 5.3 mmol/L    Chloride 102 98 - 107 mmol/L    CO2 29 22 - 29 mmol/L    Glucose 93 70 - 99 mg/dL    BUN 18 6 - 20 mg/dL    Creatinine 1.1 0.7 - 1.3 mg/dL    Anion Gap 9 2 - 17 mmol/L    OSMOLALITY CALCULATED 281 270 - 287 mOsm/kg    Calcium 8.8 8.6 - 10.0 mg/dL    Est, Glom Filt Rate 82 >=60 mL/min/1.57m  CBC with Auto Differential    Collection Time: 06/26/21  5:06 AM   Result Value Ref Range    WBC 6.2 3.8 - 10.6 x10e3/mcL    RBC 4.04 4.00 - 5.60 x10e6/mcL    Hemoglobin 10.9 (L) 13.0 - 17.3 g/dL    Hematocrit 34.3 (L) 38.0 - 52.0 %    MCV 84.9 84.0 - 100.0 fL    MCH 27.0 27.0 - 34.5 pg    MCHC 31.8 (L) 32.0 - 36.0 g/dL    RDW 14.0  11.0 - 16.0 %    Platelets 248 140 - 440 x10e3/mcL    MPV 10.9 7.2 - 13.2 fL    Neutrophils % 62.7 42.0 - 74.0 %    Lymphocytes 20.6 15.0 - 45.0 %    Monocytes 12.8 (H) 4.0 - 12.0 %    Eosinophils % 3.1 0.0 - 7.0 %    Basophils % 0.3 0.0 - 2.0 %    Neutrophils Absolute 3.9 1.6 - 7.3 x10e3/mcL    Absolute Lymph # 1.3 1.0 - 3.2 x10e3/mcL    Absolute Mono # 0.8 0.3 - 1.0 x10e3/mcL    Absolute Eos # 0.2 0.0 - 0.5 x10e3/mcL    Absolute Baso # 0.0 0.0 - 0.2 x10e3/mcL    Immature Granulocytes 0.5 0.0 - 0.6 %    Immature Grans (Abs) 0.03 0.00 - 0.06 x10e3/mcL   Magnesium    Collection Time: 06/26/21  5:06 AM   Result Value Ref Range    Magnesium 2.1 1.6 - 2.6 mg/dL       Discharge condition: good  Disposition: Home  Time spent on discharge: 40 minutes    Electronically signed by Sharyn Dross, MD on 06/26/21 at 2:09 PM EDT

## 2021-06-26 NOTE — Discharge Instructions (Addendum)
Incentive spirometry  Call/return with any problem    Take Eliquis 10 mg orally twice daily through and including May 15.  On May 16 take 5 mg twice daily throughout.  Will need to be on this medicine likely 6 months.  Follow-up with primary care physician.  As needed follow-up with pulmonary

## 2021-06-26 NOTE — Progress Notes (Signed)
Acute Care Occupational Therapy Evaluation   Inpatient  OT Visit Days: 1  Time In: 1124Time Out:1150 (13mnutes)  Acknowledge Orders   OT Charge Capture     Admitting Diagnosis:  Pulmonary embolism, other, unspecified chronicity, unspecified whether acute cor pulmonale present ([facility] [I26.99]     Reason for Referral: Generalized Muscle Weakness (M62.81)    Payor: [employer] / Plan: [employer] / Product Type: *No Product type* /   SUBJECTIVE   Ronald Holawayis a 51y.o. male admitted with Pulmonary embolism, other, unspecified chronicity, unspecified whether acute cor pulmonale present ([place].  He  has a past medical history of Anxiety, Arthritis, BMI 32.0-32.9,adult, COVID, Depression, Fibromyalgia, Generalized headache, GERD (gastroesophageal reflux disease), Herpes, Hiatal hernia, Hyperlipidemia, Hypertension, Left ankle instability, Seasonal allergies, Unstable ankle, left, and Wears glasses.  He also  has a past surgical history that includes Foot surgery (Left); Toe Surgery (2019); Ankle surgery (Left, 06/28/2019); Endoscopy, colon, diagnostic (04/09/2021); Ankle surgery (Left, 04/30/2021); and Colonoscopy.    Subjective: "I'm on the edge.... I'm on the edge of not being able to breathe..Marland KitchenMarland KitchenMarland Kitchen"  Discussed pt's unusual hyperventilation presentation with nsg         Additional Pertinent History:reports 2 falls recently at home, one while walking the do ("I'm doing doing that anymore..." and the other when coming down steps.  Discussed safety measures in general to minimize risk of further falls        Social Environment  Prior Level of Function ADL  Prior Level of Function IADL    Lives With: Spouse  Type of Home: House  Home Layout: One level  Home Access: Stairs to enter without rails  Bathroom Shower/Tub: SCivil engineer, contractingwith back, TAdministrator, [employer] Standard  Home Equipment: CProofreader Ambulation Assistance: Independent (with crutches)  Transfer Assistance: Independent  Receives Help From:  Family  ADL Assistance: Needs assistance  Toileting: Independent  Homemaking Assistance: Needs assistance       History of Falls:Yes    Comments:        OBJECTIVE     Vital Signs / Pain Lines & Drains /Precautions   BP: 106/73  BP Location: Left upper arm  MAP (Calculated): 84  O2 Device: None (Room air) ;  sats stayed at/above 92% this visit    Pain  Pain: 6/10 at rest, bilateral lungs and pt noted to be hyperventilating in short episodes at rest which he relayed is "worse" than his norm        Precautions/Restrictions  Restrictions/Precautions  Restrictions/Precautions: Weight Bearing  Lower Extremity Weight Bearing Restrictions  Left Lower Extremity Weight Bearing: Weight Bearing As Tolerated with boot on     Orientation/Cognition Vision/Hearing   Orientation  Overall Orientation Status: Within Normal Limits  Orientation Level: Oriented X4  Cognition  Overall Cognitive Status: WFL  Cognition Comment: pt appeared to have decreased insight given his request to walk in hallway after demonstrating signif SOB, hyperventilation and making comment about feeling "on the edge";  long discussion why it's so important to not overdue when body providing cues such as these Vision  Vision: Within Functional Limits  Hearing  Hearing: Within functional limits      Objective Assessment  Gross Assessment             Activities of Daily Living     Feeding: Independent  Grooming: Supervision  Grooming Skilled Clinical Factors: seated  UE Dressing: Setup  LE Dressing: Minimal assistance  LE Dressing Skilled Clinical Factors:  with L boot management    Equipment Provided:      Comments:         Functional Activity  Transfers   Sit to stand: Stand by assistance  Stand to sit: Stand by assistance  Toilet Transfer: Stand by assistance       Comments:         Safety: Type of Devices: All fall risk precautions in place;Call light within reach;Left in chair;Nurse notified     Education: Education Given To: Patient;Family  Education Provided:  Role of Therapy;ADL Adaptive Strategies;Fall Prevention Strategies;Plan of Care;Transfer Training;Family Education;Energy Conservation;Precautions  Education Method: Demonstration;Verbal  Barriers to Learning: Cognition (pt admitted to poor memory;  had no recall of nsg administering pain meds earlier in this session)  Education Outcome: Verbalized understanding;Continued education needed    [facility]	[place] AM-PACT "6 Clicks" Basic ADL Inpatient Short Form   How much help is needed for putting on and taking off regular lower body clothing?: A Little  How much help is needed for bathing (which includes washing, rinsing, drying)?: A Little  How much help is needed for toileting (which includes using toilet, bedpan, or urinal)?: A Little  How much help is needed for putting on and taking off regular upper body clothing?: A Little  How much help is needed for taking care of personal grooming?: A Little  How much help for eating meals?: A Little  AM-PAC Inpatient Daily Activity Raw Score: 18  AM-PAC Inpatient ADL T-Scale Score : 38.66  ADL Inpatient CMS 0-100% Score: 46.65  ADL Inpatient CMS G-Code Modifier : CK     ASSESSMENT   Assessment     Pt appears to be close to his baseline level for ADLs, yet mobility limited this visit by short episodes of hyperventilation and pt concern for difficulty breathing.  Wife arrived mid-eval, both pt and wife expressed they felt fine if d/c'd later today.  Both educated re: energy conservation and benefits of pursed lip breathing.  Will follow as long as he remains in house, to work toward following goals -     Evaluation Complexity:  Medium Complexity      POST ACUTE RECOMMENDATIONS   Setting: [place] for Recommended Setting: Recommended to help patient obtain maximum functional independence.     Equipment:        PLAN   Frequency and Duration 2 times/week, 3 times/week for duration of hospital stay or until stated goals are met, whichever comes  first.    Mr. Stmarie presents with Good therapy prognosis. Skilled intervention is medically necessary to address the following performance deficits: Decreased functional mobility , Decreased safe awareness, Decreased balance, Decreased coordination, Decreased ADL status, Decreased cognition, Decreased posture, Decreased endurance, Decreased strength, Decreased high-level IADLs.  Benefits and precautions of occupational therapy have been discussed with Mr. Lefeber, and the following interventions are recommended: Strengthening, Balance training, Functional mobility training, Endurance training, Pain management, Safety education & training, Patient/Caregiver education & training, Equipment evaluation, education, & procurement, Self-Care / ADL.    Goals Patient goals : home maybe later today  Short Term Goals Long Term Goals   Time Frame for Short Term Goals: 1 visit  Short Term Goal 1: supervision bed and toilet txfers  Short Term Goal 2: supervision LB ADLs  Short Term Goal 3: supervision household mob with crutches during simple item retrieval without cues for safety        Therapist Signature: Serina Nichter L Kevia Zaucha, OT  Date: 06/26/2021

## 2021-06-26 NOTE — Progress Notes (Signed)
ICU progress note     Subjective:   Critical Care Daily Progress Note: 06/26/2021     Interval History:   Early this AM, around 1245, patient had another bout of severe pain with associated desaturations in the 80s. Stated, "it feels like a midget with a small knife is stabbing me". During rounds, patient is preparing to ambulate around the unit with ankle brace and crutches without difficulty or desaturations. Still c/o left sided pleuritic chest pain with splinting. Denies bleeding, syncope, N/V.   Hemodynamics and oxygen saturation stable    Scheduled Meds:   sennosides-docusate sodium  1 tablet Oral Nightly    sucralfate  1 g Oral 4x Daily AC & HS    gabapentin  600 mg Oral TID    sodium chloride flush  5-40 mL IntraVENous 2 times per day    pantoprazole  40 mg Oral QAM AC    DULoxetine  60 mg Oral Daily    apixaban  10 mg Oral BID    Followed by    Melene Muller ON 06/29/2021] apixaban  5 mg Oral BID    acyclovir  400 mg Oral BID    traZODone  150 mg Oral Nightly    cyproheptadine  8 mg Oral Nightly     Continuous Infusions:   sodium chloride       PRN Meds:bisacodyl, HYDROcodone-acetaminophen, morphine, sodium chloride flush, sodium chloride, ondansetron **OR** ondansetron, polyethylene glycol, acetaminophen **OR** acetaminophen, potassium chloride **OR** potassium alternative oral replacement **OR** potassium chloride, magnesium sulfate    Review of Systems    Constitutional: Denies fevers, chills, sweats  Eye: Denies recent visual problems  ENMT: Denies ear pain, nasal congestion, or sore throat  Respiratory: Denies shortness of breath, cough, or difficulty breathing.   Cardiovascular: C/o left-of-sternum pleuritic chest pain, denies palpitations, syncope/near-syncope, or lower extremity swelling  Gastrointestinal: Denies nausea, vomiting, diarrhea, or abdominal pain  Genitourinary: Denies hematuria  Endocrine: Denies excessive thirst, excessive hunger  Musculoskeletal: Denies back pain, neck pain, muscle pain, c/o  decreased ROM in left ankle.   Integumentary: Denies rash, pruritus, or abrasions  Neurologic: Denies weakness, numbness, paraesthesias, confusion, or change in gait  Psychiatric: Denies anxiety, depression, or change in mood    Objective:     Vitals reviewed.  I/O last 3 completed shifts:  In: 20 [I.V.:20]  Out: 575 [Urine:575]  No intake/output data recorded.      BP 108/71   Pulse 91   Temp 99 F (37.2 C) (Axillary)   Resp 10   Ht 5\' 7"  (1.702 m)   Wt 225 lb 1.4 oz (102.1 kg)   SpO2 96%   BMI 35.25 kg/m     General: Alert and oriented, NAD  Eye: Normal conjuctiva.  HENT: Moist oral mucosa.  Lungs: Clear to auscultation bilaterally on room air  Heart: S1, S2, Normal rate, regular rhythm, no murmur.  Abdomen: Soft, non-tender, non-distended, normal bowel sounds.  Musculoskeletal: Foot and ankle immobilizer on left leg.  Skin: Skin is warm and dry.  Neurologic: Awake alert and orient x3.  Psychiatric: Cooperative, appropriate mood and affect.    Labs:  CBC:  Recent Labs     06/24/21  0129 06/25/21  0539 06/26/21  0506   WBC 8.4 6.7 6.2   RBC 4.70 4.31 4.04   HGB 12.5* 11.6* 10.9*   HCT 39.3 36.2* 34.3*   MCV 83.6* 84.0 84.9   RDW 13.8 13.8 14.0   PLT 195 216 248  CHEMISTRIES:  Recent Labs     06/24/21  0129 06/25/21  0539 06/26/21  0506   NA 136 138 140   K 3.5 3.4* 3.9   CL 97* 99 102   CO2 25 28 29    BUN 12 15 18    CREATININE 1.0 1.0 1.1   GLUCOSE 112* 133* 93   MG  --  2.1 2.1       PT/INR:No results for input(s): PROTIME, INR in the last 72 hours.  APTT:  Recent Labs     06/24/21  0406   APTT 29.7       LIVER PROFILE:  Recent Labs     06/24/21  0129 06/25/21  0539   AST 59* 35   ALT 133* 93*   BILIDIR  --  <0.20   BILITOT 0.71 0.33   ALKPHOS 182* 162*           ABGs: No results found for: PH, PO2, PCO2  Radiology review: CTA Chest W/WO Contrast PE Eval    Result Date: 06/24/2021  CTA chest: 06/24/21 INDICATION: PE shortness of breath, chest pain COMPARISON: CTA 06/20/2021 TECHNIQUE: PE protocol  (Postcontrast imaging from the thoracic inlet through the  hemidiaphragms in the pulmonary arterial phase. Axial 5x5 mm soft tissue and lung 2x2 mm images. Multiplanar 3-D volumetric MIP reconstructions through the pulmonary arteries per protocol.) CT scanning was performed using radiation dose  reduction techniques when appropriate, per system protocols. Pulmonary arteries: Subtle pulmonary embolus involving the right and left main pulmonary arteries. There is clot extension into lobar, segmental and subsegmental branches bilaterally. Overall, the clot burden appears similar to prior CT. Lungs/Airways: There are small subpleural consolidations in the basal lower lobes bilaterally, which are new from prior exam. 6 mm nodule in the right middle lobe. Pleura: No pleural effusion or pneumothorax. Lymph Nodes: No mediastinal, hilar, or axillary adenopathy. Cardiovascular: Normal heart size. No pericardial effusion. RV:LV ratio is less than 1. There is very mild flattening of the interventricular septum. Osseous structures: No suspicious lytic or blastic osseous lesions. Upper Abdomen: Unremarkable.     Saddle pulmonary embolus extending into the right and left main pulmonary arteries with clot extension into the lobar, segmental and subsegmental branches  bilaterally. Overall, the clot burden is similar in appearance to CT from 06/20/2021. Mild flattening of the interventricular septum, which could represent right heart strain. RV to LV ratio is less than 1. Interval development of small pulmonary infarcts in the lower lobes. 6 mm right middle lobe nodule, which appears slightly smaller when compared to recent prior CT, and could be inflammatory or infectious.       Assessment:     Principal Problem:    Pulmonary embolism, other, unspecified chronicity, unspecified whether acute cor pulmonale present (H[facility])  Resolved Problems:    * No resolved hospital problems. *      Plan:     Chest pain  D/t pleuritic pain from  pulmonary infarctions.   Continue gabapentin, PRN Norco, Morphine, and Tylenol.     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 Would treat at least 3 months  Echocardiogram last admission showed normal EF and no RV strain.     Pulmonary nodule  RML nodule seen on CT with also emphysema.  Will need outpatient follow-up     Hx of DVT  Last admission, Doppler showed left femoral vein DVT, continue Eliquis    Hypertension  Continue  home meds when clinically indicated     Hyperlipidemia  LFTs mildly elevated we will continue to trend and hold statin for now     Anxiety  Continue home Cymbalta, trazodone for sleep    Hx Gastric ulcer   Ideally we would start Toradol for his pleuritic pain, but contraindicated d/t his gastric ulcer Hx.   Continue PPI and Carafate    Routine ICU Care:    Nutrition: Regular diet  Devices: PIV  Endo: keep sugars 140-180.  Skin: Intact  PPX: SCDs, Eliquis, PPI  Code: Full  Dispo: Med/surg vs DC home

## 2021-06-26 NOTE — Progress Notes (Signed)
DURING ROUNDS, MD AND RN STATE PATIENT AMBULATED IN HALLWAY WITH CRUTCHES WITHOUT DIFFICULTY THIS MORNING. WILL FOLLOW AS NEEDED.

## 2021-06-28 NOTE — Telephone Encounter (Signed)
Called and discussed patient's care with Dr Lacey Jensen at [facility] in [place], [place]. Patient in ER with extensive clot burden from PE

## 2021-07-06 ENCOUNTER — Ambulatory Visit: Admit: 2021-07-06 | Discharge: 2021-07-06 | Payer: PRIVATE HEALTH INSURANCE | Attending: Surgical

## 2021-07-06 DIAGNOSIS — M19072 Primary osteoarthritis, left ankle and foot: Secondary | ICD-10-CM

## 2021-07-06 MED ORDER — OXYCODONE HCL 5 MG PO TABS
5 MG | ORAL_TABLET | Freq: Four times a day (QID) | ORAL | 0 refills | Status: AC | PRN
Start: 2021-07-06 — End: 2021-07-13

## 2021-07-06 NOTE — Progress Notes (Signed)
ORTHOPAEDICS      SUBJECTIVE        Patient ID: Ronald Herrera is a 51 y.o. male     CHIEF COMPLAINT     CC: Date of surgery: 04/30/2021         - s/p left ankle arthroscopy, microfracture OCD lesion, lateral ligament reconstruction, peroneal tendon debridement, superficial peroneal nerve neurolysis        - Admitted 5/10 for PE    HISTORY OF PRESENT ILLNESS    Patient presents for follow up for his left ankle.  Following his last visit patient was evaluated in multiple different emergency rooms for vascular duplex ultrasound.  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    Patient has been treating with vascular and has been taking Eliquis since his time of discharge from the hospital.    Patient continues to complain of ankle pain and swelling, he has been wearing the cam walker boot and putting minimal weight on the left ankle.    Patient denies fever, chest pain, cough, or exertional shortness of breath at time of today's visit.    PAST MEDICAL HISTORY     Past Medical History:   Diagnosis Date    Anxiety     Arthritis     LEFT ANKLE    BMI 32.0-32.9,adult     COVID 2022    Depression     Fibromyalgia     Generalized headache     GERD (gastroesophageal reflux disease)     Herpes     Hiatal hernia     Hyperlipidemia     Hypertension     Left ankle instability     Seasonal allergies     Unstable ankle, left     Wears glasses        SURGICAL HISTORY       Past Surgical History:   Procedure Laterality Date    ANKLE SURGERY Left 06/28/2019    left ankle partial resection tibia, partial resection fibula, brostrom gould lateral ankle ligament reconstruction, left ankle mosaicplasty with aosteochondral plug transfer from ipsilateral knee-dr. blake ohlson    ANKLE SURGERY Left 04/30/2021    LEFT TIBIOTALAR ARTHRODESIS WITH ASPIRATION PROXIMAL TIBIA, + Left ankle arthroscopy performed by Baxter HireJoshua H Lamb, MD at [facility]    COLONOSCOPY      ENDOSCOPY, COLON, DIAGNOSTIC   04/09/2021    FOOT SURGERY Left     IP fusion 12/2017    TOE SURGERY  2019    Left foot [facility] [place]       CURRENT MEDICATIONS       Previous Medications    ACETAMINOPHEN (TYLENOL) 500 MG TABLET    Take 2 tablets by mouth 2 times daily    ACYCLOVIR (ZOVIRAX) 400 MG TABLET    Take 1 tablet by mouth 2 times daily    APIXABAN (ELIQUIS) 5 MG TABS TABLET    Take 2 tablets by mouth 2 times daily for 7 days, THEN 1 tablet 2 times daily for 24 days.    APIXABAN (ELIQUIS) 5 MG TABS TABLET    Take 1 tablet by mouth 2 times daily    ASCORBIC ACID (VITAMIN C) 250 MG TABLET    Take 1 tablet by mouth daily    ATORVASTATIN (LIPITOR) 40 MG TABLET    Take 0.5 tablets by mouth daily    CETIRIZINE (ZYRTEC) 10 MG TABLET    Take 1  tablet by mouth every morning    CYPROHEPTADINE (PERIACTIN) 4 MG TABLET    Take 2 tablets by mouth nightly    DULOXETINE (CYMBALTA) 60 MG EXTENDED RELEASE CAPSULE    Take 1 capsule by mouth daily    ERGOCALCIFEROL (ERGOCALCIFEROL) 1.25 MG (50000 UT) CAPSULE    Take 1 capsule by mouth once a week (Monday)    GABAPENTIN (NEURONTIN) 300 MG CAPSULE    Take 2 capsules by mouth 3 times daily for 30 days.    HYDROCODONE-ACETAMINOPHEN (NORCO) 10-325 MG PER TABLET    Take 1 tablet by mouth every 6 hours as needed for Pain for up to 10 days. Max Daily Amount: 4 tablets    METHOCARBAMOL (ROBAXIN) 750 MG TABLET    Take 1 tablet by mouth 3 times daily as needed (pain/muscle spasm)    MULTIPLE VITAMINS-MINERALS (THERAPEUTIC MULTIVITAMIN-MINERALS) TABLET    Take 1 tablet by mouth daily    NALOXONE (NARCAN) 4 MG/0.1ML LIQD NASAL SPRAY    1 spray by Nasal route as needed for Opioid Reversal    OLOPATADINE (PATANOL) 0.1 % OPHTHALMIC SOLUTION    Place 1 drop into both eyes daily as needed for Allergies    ONDANSETRON (ZOFRAN-ODT) 4 MG DISINTEGRATING TABLET    Take 1 tablet by mouth every 8 hours as needed for Nausea or Vomiting    PANTOPRAZOLE (PROTONIX) 40 MG TABLET    Take 1 tablet by mouth  every morning (before breakfast)    POLYETHYLENE GLYCOL (GLYCOLAX) 17 G PACKET    Take 17 g by mouth daily as needed for Constipation    SUCRALFATE (CARAFATE) 1 GM TABLET    Take 1 tablet by mouth in the morning and 1 tablet in the evening.    TADALAFIL (CIALIS) 20 MG TABLET    Take 1 tablet by mouth as needed for Erectile Dysfunction    TRAZODONE (DESYREL) 100 MG TABLET    Take 1.5 tablets by mouth nightly       ALLERGIES     Bee pollen, Grass pollen([id number] vern), Pollen extract, Bromfenac, and Nsaids    FAMILY HISTORY       Family History   Problem Relation Age of Onset    Diabetes Mother         SOCIAL HISTORY       Social History     Socioeconomic History    Marital status: Married    Number of children: 6   Tobacco Use    Smoking status: Former     Packs/day: 1.00     Years: 15.00     Pack years: 15.00     Types: Cigarettes     Start date: 02/16/1991     Quit date: 12/02/2020     Years since quitting: 0.5    Smokeless tobacco: Never   Substance and Sexual Activity    Alcohol use: Yes     Alcohol/week: 4.0 standard drinks     Types: 4 Cans of beer per week     Comment: 15 DRINKS PER WEEK    Drug use: Yes     Frequency: 1.0 times per week     Types: Marijuana Sheran Fava)    Sexual activity: Yes     Partners: Female   Social History Narrative    ** Merged History Encounter **            OBJECTIVE     Examination of the left ankle shows incisions well-healed.  No erythema or sign of  infection, residual significant swelling diffusely.  Tenderness and hypersensitivity over the lateral ankle.  Tenderness over the medial gutter and anterior ankle joint.  Stiffness and increased pain with gentle passive range of motion.  Ankle stable to anterior drawer sign, patient guarding on examination.  Sensation intact, palpable dorsalis pedis pulse.      ASSESSMENT      1. Arthritis of left ankle    2. Orthopedic aftercare    3. Ankle instability, left    4. Neuritis        PLAN   Patient presents for follow-up for his left ankle,  just over 2 months out from his date of surgery.  Physical therapy 3 times a week for 6 weeks per lateral ligament reconstruction protocol.  Recommend physical therapy that includes aquatic therapy if available and multiple pain relief modalities and edema management therapy.  Once patient is 10 weeks out from his date of surgery he may transition out of the boot to ASO brace and supportive shoes as pain and swelling allow.  Patient has had difficulty even transitioning to full weightbearing in the boot because of pain and swelling.  Patient will remain out of work at this time.  Follow-up in 4 to 6 weeks.  Continue to follow-up with vascular for anticoagulation regimen.  All of his questions were answered, call the office with questions or concerns.

## 2021-07-08 ENCOUNTER — Inpatient Hospital Stay: Payer: PRIVATE HEALTH INSURANCE

## 2021-07-27 NOTE — Telephone Encounter (Signed)
Patient would like to have an updated referral sent to [facility]. States a My chart message was sent regarding this.    Patient would also like to follow up on [place] forms that are suppose to be completed after each visit and sent back.

## 2021-07-28 NOTE — Telephone Encounter (Signed)
Attempted to reach pt, vm box full. Both of these things were completed after his most recent appointment. Re faxed PT order to [facility]. Attending physicians statement was faxed on 5/31. If patient calls back he can reach me directly at [phone].

## 2021-08-10 ENCOUNTER — Ambulatory Visit: Admit: 2021-08-10 | Discharge: 2021-08-10 | Payer: PRIVATE HEALTH INSURANCE | Attending: Surgical

## 2021-08-10 DIAGNOSIS — M19072 Primary osteoarthritis, left ankle and foot: Secondary | ICD-10-CM

## 2021-08-10 NOTE — Progress Notes (Unsigned)
ORTHOPAEDICS      SUBJECTIVE        Patient ID: Ronald Herrera is a 51 y.o. male     CHIEF COMPLAINT     CC: Date of surgery: 04/30/2021         - s/p left ankle arthroscopy, microfracture OCD lesion, lateral ligament reconstruction, peroneal tendon debridement, superficial peroneal nerve neurolysis        - Admitted 5/10 for PE    HISTORY OF PRESENT ILLNESS    Patient presents for follow up for his left ankle.  Following his last visit patient was evaluated in multiple different emergency rooms for vascular duplex ultrasound.  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    Patient has been treating with vascular and has been taking Eliquis since his time of discharge from the hospital.    Patient continues to complain of ankle pain and swelling, he has been wearing the cam walker boot and putting minimal weight on the left ankle.    Patient denies fever, chest pain, cough, or exertional shortness of breath at time of today's visit.    PAST MEDICAL HISTORY     Past Medical History:   Diagnosis Date    Anxiety     Arthritis     LEFT ANKLE    BMI 32.0-32.9,adult     COVID 2022    Depression     Fibromyalgia     Generalized headache     GERD (gastroesophageal reflux disease)     Herpes     Hiatal hernia     Hyperlipidemia     Hypertension     Left ankle instability     Seasonal allergies     Unstable ankle, left     Wears glasses        SURGICAL HISTORY       Past Surgical History:   Procedure Laterality Date    ANKLE SURGERY Left 06/28/2019    left ankle partial resection tibia, partial resection fibula, brostrom gould lateral ankle ligament reconstruction, left ankle mosaicplasty with aosteochondral plug transfer from ipsilateral knee-dr. blake ohlson    ANKLE SURGERY Left 04/30/2021    LEFT TIBIOTALAR ARTHRODESIS WITH ASPIRATION PROXIMAL TIBIA, + Left ankle arthroscopy performed by Baxter Hire, MD at [facility]    COLONOSCOPY      ENDOSCOPY, COLON, DIAGNOSTIC   04/09/2021    FOOT SURGERY Left     IP fusion 12/2017    TOE SURGERY  2019    Left foot [facility] [place]       CURRENT MEDICATIONS       Previous Medications    ACETAMINOPHEN (TYLENOL) 500 MG TABLET    Take 2 tablets by mouth 2 times daily    ACYCLOVIR (ZOVIRAX) 400 MG TABLET    Take 1 tablet by mouth 2 times daily    ALBUTEROL SULFATE HFA (PROVENTIL;VENTOLIN;PROAIR) 108 (90 BASE) MCG/ACT INHALER    INHALE 1 PUFF BY MOUTH EVERY 4 HOURS AS NEEDED FOR WHEEZING *SHAKE INHALER WELL PRIOR TO USE*    APIXABAN (ELIQUIS) 5 MG TABS TABLET    Take 1 tablet by mouth 2 times daily    ASCORBIC ACID (VITAMIN C) 250 MG TABLET    Take 1 tablet by mouth daily    ATORVASTATIN (LIPITOR) 40 MG TABLET    Take 0.5 tablets by mouth daily    CETIRIZINE (ZYRTEC) 10 MG TABLET    Take  1 tablet by mouth every morning    CYPROHEPTADINE (PERIACTIN) 4 MG TABLET    Take 2 tablets by mouth nightly    DULOXETINE (CYMBALTA) 60 MG EXTENDED RELEASE CAPSULE    Take 1 capsule by mouth daily    ERGOCALCIFEROL (ERGOCALCIFEROL) 1.25 MG (50000 UT) CAPSULE    Take 1 capsule by mouth once a week (Monday)    GABAPENTIN (NEURONTIN) 300 MG CAPSULE    Take 2 capsules by mouth 3 times daily for 30 days.    MULTIPLE VITAMINS-MINERALS (THERAPEUTIC MULTIVITAMIN-MINERALS) TABLET    Take 1 tablet by mouth daily    NALOXONE (NARCAN) 4 MG/0.1ML LIQD NASAL SPRAY    1 spray by Nasal route as needed for Opioid Reversal    OLOPATADINE (PATANOL) 0.1 % OPHTHALMIC SOLUTION    Place 1 drop into both eyes daily as needed for Allergies    PANTOPRAZOLE (PROTONIX) 40 MG TABLET    Take 1 tablet by mouth every morning (before breakfast)    SUCRALFATE (CARAFATE) 1 GM TABLET    Take 1 tablet by mouth in the morning and 1 tablet in the evening.    TADALAFIL (CIALIS) 20 MG TABLET    Take 1 tablet by mouth as needed for Erectile Dysfunction    TRAZODONE (DESYREL) 100 MG TABLET    Take 1.5 tablets by mouth nightly       ALLERGIES     Bee pollen, Grass  pollen([id number] vern), Pollen extract, Bromfenac, and Nsaids    FAMILY HISTORY       Family History   Problem Relation Age of Onset    Diabetes Mother         SOCIAL HISTORY       Social History     Socioeconomic History    Marital status: Married    Number of children: 6   Tobacco Use    Smoking status: Former     Packs/day: 1.00     Years: 15.00     Pack years: 15.00     Types: Cigarettes     Start date: 02/16/1991     Quit date: 12/02/2020     Years since quitting: 0.6    Smokeless tobacco: Never   Substance and Sexual Activity    Alcohol use: Yes     Alcohol/week: 4.0 standard drinks     Types: 4 Cans of beer per week     Comment: 15 DRINKS PER WEEK    Drug use: Yes     Frequency: 1.0 times per week     Types: Marijuana Sheran Fava)    Sexual activity: Yes     Partners: Female   Social History Narrative    ** Merged History Encounter **            OBJECTIVE     Examination of the left ankle shows incisions well-healed.  No erythema or sign of infection, residual significant swelling diffusely.  Tenderness and hypersensitivity over the lateral ankle.  Tenderness over the medial gutter and anterior ankle joint.  Stiffness and increased pain with gentle passive range of motion.  Ankle stable to anterior drawer sign, patient guarding on examination.  Sensation intact, palpable dorsalis pedis pulse.      ASSESSMENT      1. Arthritis of left ankle    2. Orthopedic aftercare          PLAN   Patient presents for follow-up for his left ankle, just over 2 months out from his date of surgery.  Physical therapy 3 times a week for 6 weeks per lateral ligament reconstruction protocol.  Recommend physical therapy that includes aquatic therapy if available and multiple pain relief modalities and edema management therapy.  Once patient is 10 weeks out from his date of surgery he may transition out of the boot to ASO brace and supportive shoes as pain and swelling allow.  Patient has had difficulty even transitioning to full  weightbearing in the boot because of pain and swelling.  Patient will remain out of work at this time.  Follow-up in 4 to 6 weeks.  Continue to follow-up with vascular for anticoagulation regimen.  All of his questions were answered, call the office with questions or concerns.

## 2021-08-14 NOTE — Telephone Encounter (Signed)
Regarding: Yesterday  Contact: [phone]  yes I am still in a lot of pain.

## 2021-08-14 NOTE — Telephone Encounter (Signed)
LVM

## 2021-08-24 ENCOUNTER — Ambulatory Visit: Admit: 2021-08-24 | Discharge: 2021-08-24 | Payer: PRIVATE HEALTH INSURANCE | Attending: Surgical

## 2021-08-24 ENCOUNTER — Ambulatory Visit: Admit: 2021-08-24 | Discharge: 2021-08-24 | Payer: PRIVATE HEALTH INSURANCE

## 2021-08-24 DIAGNOSIS — M19072 Primary osteoarthritis, left ankle and foot: Secondary | ICD-10-CM

## 2021-08-24 MED ORDER — HYDROCODONE-ACETAMINOPHEN 5-325 MG PO TABS
5-325 MG | ORAL_TABLET | Freq: Four times a day (QID) | ORAL | 0 refills | Status: AC | PRN
Start: 2021-08-24 — End: 2021-08-29

## 2021-08-24 NOTE — Progress Notes (Addendum)
ORTHOPAEDICS      SUBJECTIVE        Patient ID: Ronald Herrera is a 51 y.o. male     CHIEF COMPLAINT     CC: Date of surgery: 04/30/2021         - s/p left ankle arthroscopy, microfracture OCD lesion, lateral ligament reconstruction, peroneal tendon debridement, superficial peroneal nerve neurolysis       HISTORY OF PRESENT ILLNESS    Patient presents for follow up for his left ankle.  Status post left ankle arthroscopy, microfracture of OCD lesion, lateral ligament reconstruction, peroneal tendon debridement, and superficial peroneal nerve neurolysis.  Patient is almost 4 months out from his date of surgery.      2 months after his surgery patient was experiencing increased calf pain and swelling.  Patient was evaluated at multiple different emergency rooms for vascular duplex ultrasound in May 2023.  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    Patient has been treating with vascular and has been taking Eliquis since his time of discharge from the hospital.    Patient continues to complain of ankle pain and swelling, he has been wearing the cam walker boot and putting minimal weight on the left ankle because of pain.  Patient denies fever, chest pain, cough, or exertional shortness of breath at time of today's visit.    08/09/21:  Patient presents for follow-up for his left ankle, patient is 3 and half months out from his date of surgery.  He continues to have pain with full weightbearing in the cam walker boot or in regular shoes.  Patient is still currently on Eliquis for history of pulmonary embolus.  Patient has been going to physical therapy and feels the therapy is helping.  He does continue to have pain on a regular basis.  Complains of residual swelling.  Complains of stiffness and increased pain with range of motion of the ankle.  Denies sensation of ankle instability.  Patient states pain and swelling are worse at the end of the day.    08/24/21:  Patient  presents today for follow-up for his left ankle.  He is almost 4 months out from his date of surgery.  Patient began using his cane and had an episode where his ankle gave out on him.  Since that time he has had significant increased pain and swelling and is back on his crutches.  Patient complains of pain and swelling of the ankle that is worse with weightbearing activity.    He continues to have burning, hypersensitivity and numbness of the left ankle. Patient has constant pain on a daily basis and pain that keeps him up at night. Increased swelling and pain with range of motion exercises. Patient has not been going to PT since his most recent injury.      PAST MEDICAL HISTORY     Past Medical History:   Diagnosis Date    Anxiety     Arthritis     LEFT ANKLE    BMI 32.0-32.9,adult     COVID 2022    Depression     Fibromyalgia     Generalized headache     GERD (gastroesophageal reflux disease)     Herpes     Hiatal hernia     Hyperlipidemia     Hypertension     Left ankle instability     Seasonal allergies     Unstable ankle, left     Wears glasses  SURGICAL HISTORY       Past Surgical History:   Procedure Laterality Date    ANKLE SURGERY Left 06/28/2019    left ankle partial resection tibia, partial resection fibula, brostrom gould lateral ankle ligament reconstruction, left ankle mosaicplasty with aosteochondral plug transfer from ipsilateral knee-dr. blake ohlson    ANKLE SURGERY Left 04/30/2021    LEFT TIBIOTALAR ARTHRODESIS WITH ASPIRATION PROXIMAL TIBIA, + Left ankle arthroscopy performed by Baxter Hire, MD at [facility]    COLONOSCOPY      ENDOSCOPY, COLON, DIAGNOSTIC  04/09/2021    FOOT SURGERY Left     IP fusion 12/2017    TOE SURGERY  2019    Left foot [facility] [place]       CURRENT MEDICATIONS       Previous Medications    ACETAMINOPHEN (TYLENOL) 500 MG TABLET    Take 2 tablets by mouth 2 times daily    ACYCLOVIR (ZOVIRAX) 400 MG TABLET    Take 1 tablet by  mouth 2 times daily    ALBUTEROL SULFATE HFA (PROVENTIL;VENTOLIN;PROAIR) 108 (90 BASE) MCG/ACT INHALER    INHALE 1 PUFF BY MOUTH EVERY 4 HOURS AS NEEDED FOR WHEEZING *SHAKE INHALER WELL PRIOR TO USE*    APIXABAN (ELIQUIS) 5 MG TABS TABLET    Take 1 tablet by mouth 2 times daily    ASCORBIC ACID (VITAMIN C) 250 MG TABLET    Take 1 tablet by mouth daily    ATORVASTATIN (LIPITOR) 40 MG TABLET    Take 0.5 tablets by mouth daily    CETIRIZINE (ZYRTEC) 10 MG TABLET    Take 1 tablet by mouth every morning    CYPROHEPTADINE (PERIACTIN) 4 MG TABLET    Take 2 tablets by mouth nightly    DULOXETINE (CYMBALTA) 60 MG EXTENDED RELEASE CAPSULE    Take 1 capsule by mouth daily    ERGOCALCIFEROL (ERGOCALCIFEROL) 1.25 MG (50000 UT) CAPSULE    Take 1 capsule by mouth once a week (Monday)    GABAPENTIN (NEURONTIN) 300 MG CAPSULE    Take 2 capsules by mouth 3 times daily for 30 days.    MULTIPLE VITAMINS-MINERALS (THERAPEUTIC MULTIVITAMIN-MINERALS) TABLET    Take 1 tablet by mouth daily    NALOXONE (NARCAN) 4 MG/0.1ML LIQD NASAL SPRAY    1 spray by Nasal route as needed for Opioid Reversal    OLOPATADINE (PATANOL) 0.1 % OPHTHALMIC SOLUTION    Place 1 drop into both eyes daily as needed for Allergies    PANTOPRAZOLE (PROTONIX) 40 MG TABLET    Take 1 tablet by mouth every morning (before breakfast)    SUCRALFATE (CARAFATE) 1 GM TABLET    Take 1 tablet by mouth in the morning and 1 tablet in the evening.    TADALAFIL (CIALIS) 20 MG TABLET    Take 1 tablet by mouth as needed for Erectile Dysfunction    TRAZODONE (DESYREL) 100 MG TABLET    Take 1.5 tablets by mouth nightly       ALLERGIES     Bee pollen, Grass pollen([id number]-swt vern), Pollen extract, Bromfenac, Nsaids, and Oxycodone    FAMILY HISTORY       Family History   Problem Relation Age of Onset    Diabetes Mother         SOCIAL HISTORY       Social History     Socioeconomic History    Marital status: Married     Spouse  name: None    Number of children: 6    Years of education: None     Highest education level: None   Tobacco Use    Smoking status: Former     Packs/day: 1.00     Years: 15.00     Pack years: 15.00     Types: Cigarettes     Start date: 02/16/1991     Quit date: 12/02/2020     Years since quitting: 0.7    Smokeless tobacco: Never   Substance and Sexual Activity    Alcohol use: Yes     Alcohol/week: 4.0 standard drinks     Types: 4 Cans of beer per week     Comment: 15 DRINKS PER WEEK    Drug use: Yes     Frequency: 1.0 times per week     Types: Marijuana Sheran Fava)    Sexual activity: Yes     Partners: Female   Social History Narrative    ** Merged History Encounter **            OBJECTIVE   Well developed, well nourished male patient sitting comfortably in his chair. Awake, alert, and oriented. Answers questions appropriately, no acute distress. Normal affect. Pupils are equal and round bilaterally, anicteric sclera. Mucous membranes moist without ulceration. Respirations are unlabored, no distal cyanosis or clubbing. Palpable dorsalis pedis pulse. Hypersensitivity lateral ankle and hindfoot radiating to dorsal midfoot otherwise neurovascularly intact, fires all motor groups 5/5. No evidence of open wound, skin breakdown, or sign of infection. Skin is intact. Capillary refill less than 2 seconds.  Patient presents partial weightbearing on the left ankle with use of crutches for assistance.    Examination of the left ankle shows incisions well-healed.  No erythema or sign of infection, residual swelling diffusely.  Tenderness over the medial gutter and anterior ankle joint.  Tenderness over the lateral gutter and peroneal tendons within the fibular groove.  No obvious subluxation or dislocation of peroneal tendons.  Guarding on examination and difficulty to perform resisted circumduction of the ankle secondary to pain.      Residual stiffness and increased pain with gentle passive range of motion.  Ankle stable to anterior drawer sign, patient guarding on examination.       Hypersensitivity lateral ankle, hindfoot and dorsal midfoot otherwise sensation intact, palpable dorsalis pedis pulse.      X-rays left ankle:  3 views of the left ankle taken today in the office and reviewed with the patient in detail at today's visit show no evidence of acute fracture or dislocation. Ankle joint congruent, mortise intact. Degenerative changes of the medial and lateral gutter of the ankle joint. 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 Dorsal bossing of the talonavicular joint on lateral view.    ASSESSMENT      1. Arthritis of left ankle    2. Orthopedic aftercare    3. Ankle instability, left    4. Neuritis          PLAN   Patient presents for follow-up for his left ankle, patient is almost 4 months out from his date of surgery.  Nature of pathology discussed in detail with the patient at today's visit.    Patient has experienced recurrence of symptoms of ankle instability since his last visit.  He presents at today's visit with complaints of increased pain and swelling since his last visit.  Increased pain with putting weight on the left lower extremity.  Patient has held off on physical therapy since his last visit due to severity of pain and swelling.  Patient is very concerned about his ongoing symptoms of hypersensitivity, tenderness and pain and stiffness with range of motion.    Recommend MRI of the left ankle without contrast to evaluate OCD lesion as well as to evaluate peroneal tendons.    Patient will remain out of work at this time.  Continue to follow-up with vascular for anticoagulation regimen.      Call the office upon completion of MRI scan for review of results and to schedule follow-up appoint with myself or Dr. Randa Lynn accordingly based on the results of the MRI scan.      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    Patient is unable to take anti-inflammatory medication due to his current Eliquis regimen, very short course of pain medication prescribed to be used only for severe pain at night.    All of his questions were answered, call the office with questions or concerns.

## 2021-08-27 NOTE — Telephone Encounter (Signed)
Pt seen in office Monday 08/24/21.

## 2021-09-13 ENCOUNTER — Inpatient Hospital Stay: Admit: 2021-09-13 | Payer: PRIVATE HEALTH INSURANCE

## 2021-09-13 DIAGNOSIS — Z4789 Encounter for other orthopedic aftercare: Secondary | ICD-10-CM

## 2021-09-13 DIAGNOSIS — M19072 Primary osteoarthritis, left ankle and foot: Secondary | ICD-10-CM

## 2021-09-14 ENCOUNTER — Ambulatory Visit: Admit: 2021-09-14 | Discharge: 2021-09-14 | Payer: PRIVATE HEALTH INSURANCE | Attending: Surgical

## 2021-09-14 DIAGNOSIS — M19072 Primary osteoarthritis, left ankle and foot: Secondary | ICD-10-CM

## 2021-09-14 NOTE — Progress Notes (Incomplete)
ORTHOPAEDICS      SUBJECTIVE        Patient ID: Ronald Herrera is a 51 y.o. male     CHIEF COMPLAINT     CC: Date of surgery: 04/30/2021         - s/p left ankle arthroscopy, microfracture OCD lesion, lateral ligament reconstruction, peroneal tendon debridement, superficial peroneal nerve neurolysis         - MRI review -- MRI on PACS    HISTORY OF PRESENT ILLNESS    Patient presents for follow up for his left ankle.  Status post left ankle arthroscopy, microfracture of OCD lesion, lateral ligament reconstruction, peroneal tendon debridement, and superficial peroneal nerve neurolysis.  Patient is almost 4 months out from his date of surgery.      2 months after his surgery patient was experiencing increased calf pain and swelling.  Patient was evaluated at multiple different emergency rooms for vascular duplex ultrasound in May 2023.  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    Patient has been treating with vascular and has been taking Eliquis since his time of discharge from the hospital.    Patient continues to complain of ankle pain and swelling, he has been wearing the cam walker boot and putting minimal weight on the left ankle because of pain.  Patient denies fever, chest pain, cough, or exertional shortness of breath at time of today's visit.    08/09/21:  Patient presents for follow-up for his left ankle, patient is 3 and half months out from his date of surgery.  He continues to have pain with full weightbearing in the cam walker boot or in regular shoes.  Patient is still currently on Eliquis for history of pulmonary embolus.  Patient has been going to physical therapy and feels the therapy is helping.  He does continue to have pain on a regular basis.  Complains of residual swelling.  Complains of stiffness and increased pain with range of motion of the ankle.  Denies sensation of ankle instability.  Patient states pain and swelling are worse at the end  of the day.    08/24/21:  Patient presents today for follow-up for his left ankle.  He is almost 4 months out from his date of surgery.  Patient began using his cane and had an episode where his ankle gave out on him.  Since that time he has had significant increased pain and swelling and is back on his crutches.  Patient complains of pain and swelling of the ankle that is worse with weightbearing activity.    He continues to have burning, hypersensitivity and numbness of the left ankle. Patient has constant pain on a daily basis and pain that keeps him up at night. Increased swelling and pain with range of motion exercises. Patient has not been going to PT since his most recent injury.    09/14/21:        PAST MEDICAL HISTORY     Past Medical History:   Diagnosis Date    Anxiety     Arthritis     LEFT ANKLE    BMI 32.0-32.9,adult     COVID 2022    Depression     Fibromyalgia     Generalized headache     GERD (gastroesophageal reflux disease)     Herpes     Hiatal hernia     Hyperlipidemia     Hypertension     Left ankle instability  Seasonal allergies     Unstable ankle, left     Wears glasses        SURGICAL HISTORY       Past Surgical History:   Procedure Laterality Date    ANKLE SURGERY Left 06/28/2019    left ankle partial resection tibia, partial resection fibula, brostrom gould lateral ankle ligament reconstruction, left ankle mosaicplasty with aosteochondral plug transfer from ipsilateral knee-dr. blake ohlson    ANKLE SURGERY Left 04/30/2021    LEFT TIBIOTALAR ARTHRODESIS WITH ASPIRATION PROXIMAL TIBIA, + Left ankle arthroscopy performed by Baxter Hire, MD at [facility]    COLONOSCOPY      ENDOSCOPY, COLON, DIAGNOSTIC  04/09/2021    FOOT SURGERY Left     IP fusion 12/2017    TOE SURGERY  2019    [facility] [place]       CURRENT MEDICATIONS       Previous Medications    ACETAMINOPHEN (TYLENOL) 500 MG TABLET    Take 2 tablets by mouth 2 times daily     ACYCLOVIR (ZOVIRAX) 400 MG TABLET    Take 1 tablet by mouth 2 times daily    ALBUTEROL SULFATE HFA (PROVENTIL;VENTOLIN;PROAIR) 108 (90 BASE) MCG/ACT INHALER    INHALE 1 PUFF BY MOUTH EVERY 4 HOURS AS NEEDED FOR WHEEZING *SHAKE INHALER WELL PRIOR TO USE*    APIXABAN (ELIQUIS) 5 MG TABS TABLET    Take 1 tablet by mouth 2 times daily    ASCORBIC ACID (VITAMIN C) 250 MG TABLET    Take 1 tablet by mouth daily    ATORVASTATIN (LIPITOR) 40 MG TABLET    Take 0.5 tablets by mouth daily    CETIRIZINE (ZYRTEC) 10 MG TABLET    Take 1 tablet by mouth every morning    CYPROHEPTADINE (PERIACTIN) 4 MG TABLET    Take 2 tablets by mouth nightly    DULOXETINE (CYMBALTA) 60 MG EXTENDED RELEASE CAPSULE    Take 1 capsule by mouth daily    ERGOCALCIFEROL (ERGOCALCIFEROL) 1.25 MG (50000 UT) CAPSULE    Take 1 capsule by mouth once a week (Monday)    GABAPENTIN (NEURONTIN) 300 MG CAPSULE    Take 2 capsules by mouth 3 times daily for 30 days.    HYDROCHLOROTHIAZIDE (HYDRODIURIL) 25 MG TABLET    Take 1 tablet by mouth daily    LOSARTAN (COZAAR) 100 MG TABLET    Take 1 tablet by mouth daily    MULTIPLE VITAMINS-MINERALS (THERAPEUTIC MULTIVITAMIN-MINERALS) TABLET    Take 1 tablet by mouth daily    NALOXONE (NARCAN) 4 MG/0.1ML LIQD NASAL SPRAY    1 spray by Nasal route as needed for Opioid Reversal    OLOPATADINE (PATANOL) 0.1 % OPHTHALMIC SOLUTION    Place 1 drop into both eyes daily as needed for Allergies    PANTOPRAZOLE (PROTONIX) 40 MG TABLET    Take 1 tablet by mouth every morning (before breakfast)    SUCRALFATE (CARAFATE) 1 GM TABLET    Take 1 tablet by mouth in the morning and 1 tablet in the evening.    TADALAFIL (CIALIS) 20 MG TABLET    Take 1 tablet by mouth as needed for Erectile Dysfunction    TRAZODONE (DESYREL) 100 MG TABLET    Take 1.5 tablets by mouth nightly       ALLERGIES     Bee pollen, Grass pollen([id number] vern), Pollen extract, Bromfenac, Nsaids, and Oxycodone    FAMILY  HISTORY       Family History   Problem Relation  Age of Onset    Diabetes Mother         SOCIAL HISTORY       Social History     Socioeconomic History    Marital status: Married     Spouse name: None    Number of children: 6    Years of education: None    Highest education level: None   Tobacco Use    Smoking status: Former     Packs/day: 1.00     Years: 15.00     Pack years: 15.00     Types: Cigarettes     Start date: 02/16/1991     Quit date: 12/02/2020     Years since quitting: 0.7    Smokeless tobacco: Never   Substance and Sexual Activity    Alcohol use: Yes     Alcohol/week: 4.0 standard drinks     Types: 4 Cans of beer per week     Comment: 15 DRINKS PER WEEK    Drug use: Yes     Frequency: 1.0 times per week     Types: Marijuana Sheran Fava)    Sexual activity: Yes     Partners: Female   Social History Narrative    ** Merged History Encounter **            OBJECTIVE   Well developed, well nourished male patient sitting comfortably in his chair. Awake, alert, and oriented. Answers questions appropriately, no acute distress. Normal affect. Pupils are equal and round bilaterally, anicteric sclera. Mucous membranes moist without ulceration. Respirations are unlabored, no distal cyanosis or clubbing. Palpable dorsalis pedis pulse. Hypersensitivity lateral ankle and hindfoot radiating to dorsal midfoot otherwise neurovascularly intact, fires all motor groups 5/5. No evidence of open wound, skin breakdown, or sign of infection. Skin is intact. Capillary refill less than 2 seconds.  Patient presents partial weightbearing on the left ankle with use of crutches for assistance.    Examination of the left ankle shows incisions well-healed.  No erythema or sign of infection, residual swelling diffusely.  Tenderness over the medial gutter and anterior ankle joint.  Tenderness over the lateral gutter and peroneal tendons within the fibular groove.  No obvious subluxation or dislocation of peroneal tendons.  Guarding on examination and difficulty to perform resisted circumduction  of the ankle secondary to pain.      Residual stiffness and increased pain with gentle passive range of motion.  Ankle stable to anterior drawer sign, patient guarding on examination.      Hypersensitivity lateral ankle, hindfoot and dorsal midfoot otherwise sensation intact, palpable dorsalis pedis pulse.      MRI left ankle:  MRI left ankle without contrast including T1 and T2 weighted images performed at [facility] on September 13, 2021 and independently reviewed with the patient at today's visit shows:    Indication: Pain., 09/13/21 osteochondral lesion. Possible peroneal tendon tear     Technical: Axial, sagittal, coronal T1 and fat saturated T2 images of the LEFT   ankle. No contrast.     Comparison: Radiographs 08/24/2021.     Findings: There is no osseous malalignment. There is bony irregularity at the   lateral talar articular surface, minimally edematous, measuring 12 x 6 mm. Mild   periarticular demineralization is present throughout the hindfoot. There is   tibiotalar and posterior subtalar joint space narrowing. Retrocalcaneal bursitis   is present. There is no retro-Achilles bursitis.  Achilles, posterior tibial, flexor, anterior tibial, extensor and peroneal   tendons are intact.     There is no well-defined anterior talofibular ligament. I believe I see portions   of the calcaneofibular ligament. The posterior talofibular ligament appears   attenuated, thin. Deep deltoid fibers are intact. Spring ligament is intact.     There is mild muscle atrophy in the intrinsic musculature of the foot. Plantar   fascia is intact.     IMPRESSION:  Bony irregularity along the articular surface of the lateral talar dome,   minimally edematous, consistent with 12 x 6 mm osteochondral lesion. No   fragmentation stability.     Postsurgical changes in the lateral ankle. No substantial anterior talofibular   ligament fibers. Possible partial visualization of the calcaneofibular ligament,   incompletely seen. Attenuated and thin  posterior talofibular ligament.     No peroneal tendon abnormality.     Muscle atrophy intrinsic musculature foot    ASSESSMENT      1. Arthritis of left ankle    2. Ankle instability, left    3. Orthopedic aftercare    4. Neuritis            PLAN

## 2021-09-21 NOTE — Assessment & Plan Note (Signed)
MRI LT ANKLE 09/14/21 [facility]  IMPRESSION:  Bony irregularity along the articular surface of the lateral talar dome,   minimally edematous, consistent with 12 x 6 mm osteochondral lesion. No   fragmentation stability.    Postsurgical changes in the lateral ankle. No substantial anterior talofibular   ligament fibers. Possible partial visualization of the calcaneofibular ligament,   incompletely seen. Attenuated and thin posterior talofibular ligament.    No peroneal tendon abnormality.

## 2021-09-28 ENCOUNTER — Ambulatory Visit: Admit: 2021-09-28 | Discharge: 2021-09-28 | Payer: PRIVATE HEALTH INSURANCE | Attending: Surgical

## 2021-09-28 DIAGNOSIS — M792 Neuralgia and neuritis, unspecified: Secondary | ICD-10-CM

## 2021-09-28 DIAGNOSIS — M19072 Primary osteoarthritis, left ankle and foot: Secondary | ICD-10-CM

## 2021-09-28 NOTE — Progress Notes (Signed)
ORTHOPAEDICS      SUBJECTIVE        Patient ID: Ronald Herrera is a 51 y.o. male     CHIEF COMPLAINT     CC: Date of surgery: 04/30/2021         - s/p left ankle arthroscopy, microfracture OCD lesion, lateral ligament reconstruction, peroneal tendon debridement, superficial peroneal nerve neurolysis         - +hx DVT and PE postoperatively -- pt currently taking Eliquis         - MRI left ankle review -- MRI on PACS    HISTORY OF PRESENT ILLNESS    Patient presents for follow up for his left ankle.  Status post left ankle arthroscopy, microfracture of OCD lesion, lateral ligament reconstruction, peroneal tendon debridement, and superficial peroneal nerve neurolysis.  Patient is almost 4 months out from his date of surgery.      2 months after his surgery patient was experiencing increased calf pain and swelling.  Patient was evaluated at multiple different emergency rooms for vascular duplex ultrasound in May 2023.  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    Patient has been treating with vascular and has been taking Eliquis since his time of discharge from the hospital.    Patient continues to complain of ankle pain and swelling, he has been wearing the cam walker boot and putting minimal weight on the left ankle because of pain.  Patient denies fever, chest pain, cough, or exertional shortness of breath at time of today's visit.    08/09/21:  Patient presents for follow-up for his left ankle, patient is 3 and half months out from his date of surgery.  He continues to have pain with full weightbearing in the cam walker boot or in regular shoes.  Patient is still currently on Eliquis for history of pulmonary embolus.  Patient has been going to physical therapy and feels the therapy is helping.  He does continue to have pain on a regular basis.  Complains of residual swelling.  Complains of stiffness and increased pain with range of motion of the ankle.  Denies  sensation of ankle instability.  Patient states pain and swelling are worse at the end of the day.    08/24/21:  Patient presents today for follow-up for his left ankle.  He is almost 4 months out from his date of surgery.  Patient began using his cane and had an episode where his ankle gave out on him.  Since that time he has had significant increased pain and swelling and is back on his crutches.  Patient complains of pain and swelling of the ankle that is worse with weightbearing activity.    He continues to have burning, hypersensitivity and numbness of the left ankle. Patient has constant pain on a daily basis and pain that keeps him up at night. Increased swelling and pain with range of motion exercises. Patient has not been going to PT since his most recent injury.    09/14/21:  Patient presents today with complaints of ongoing pain, patient is still unable to put full weight on the left ankle.  Primary complaint is hypersensitivity and tenderness over the lateral ankle and proximal to the ankle joint.      Stiffness with range of motion of the ankle joint secondary to significant pain with range of motion of the ankle.  Patient has been going back and forth between ankle stabilizing brace and custom [place]  brace.  He is still using crutches as needed for assistance with weightbearing activity.    Patient presents at today's visit for review of left ankle MRI results.  Patient MRI of the left ankle without contrast performed at [facility] facility.    09/28/21  Patient returns today for follow-up of his ankle.  Unfortunately he continues to have pain along the lateral aspect of his ankle with hypersensitivity.  He also continues have some pain within the ankle but notes the hypersensitivity is his biggest concern.       PAST MEDICAL HISTORY     Past Medical History:   Diagnosis Date    Anxiety     Arthritis     LEFT ANKLE    BMI 32.0-32.9,adult     COVID 2022    Depression     Fibromyalgia     Generalized headache      GERD (gastroesophageal reflux disease)     Herpes     Hiatal hernia     Hyperlipidemia     Hypertension     Left ankle instability     Seasonal allergies     Unstable ankle, left     Wears glasses        SURGICAL HISTORY       Past Surgical History:   Procedure Laterality Date    ANKLE SURGERY Left 06/28/2019    left ankle partial resection tibia, partial resection fibula, brostrom gould lateral ankle ligament reconstruction, left ankle mosaicplasty with aosteochondral plug transfer from ipsilateral knee-dr. blake ohlson    ANKLE SURGERY Left 04/30/2021    LEFT TIBIOTALAR ARTHRODESIS WITH ASPIRATION PROXIMAL TIBIA, + Left ankle arthroscopy performed by Thurman Coyer, MD at [facility]    COLONOSCOPY      ENDOSCOPY, COLON, DIAGNOSTIC  04/09/2021    FOOT SURGERY Left     IP fusion 12/2017    TOE SURGERY  2019    [facility] [place]       Previous Medications    ACETAMINOPHEN (TYLENOL) 500 MG TABLET    Take 2 tablets by mouth 2 times daily    ACYCLOVIR (ZOVIRAX) 400 MG TABLET    Take 1 tablet by mouth 2 times daily    ALBUTEROL SULFATE HFA (PROVENTIL;VENTOLIN;PROAIR) 108 (90 BASE) MCG/ACT INHALER    INHALE 1 PUFF BY MOUTH EVERY 4 HOURS AS NEEDED FOR WHEEZING *SHAKE INHALER WELL PRIOR TO USE*    APIXABAN (ELIQUIS) 5 MG TABS TABLET    Take 1 tablet by mouth 2 times daily    ASCORBIC ACID (VITAMIN C) 250 MG TABLET    Take 1 tablet by mouth daily    ATORVASTATIN (LIPITOR) 40 MG TABLET    Take 0.5 tablets by mouth daily    CETIRIZINE (ZYRTEC) 10 MG TABLET    Take 1 tablet by mouth every morning    CYPROHEPTADINE (PERIACTIN) 4 MG TABLET    Take 2 tablets by mouth nightly    DULOXETINE (CYMBALTA) 60 MG EXTENDED RELEASE CAPSULE    Take 1 capsule by mouth daily    ERGOCALCIFEROL (ERGOCALCIFEROL) 1.25 MG (50000 UT) CAPSULE    Take 1 capsule by mouth once a week (Monday)    GABAPENTIN (NEURONTIN) 300 MG CAPSULE    Take 2 capsules by mouth 3 times daily for  30 days.    HYDROCHLOROTHIAZIDE (HYDRODIURIL) 25 MG TABLET    Take 1 tablet by  mouth daily    LOSARTAN (COZAAR) 100 MG TABLET    Take 1 tablet by mouth daily    MULTIPLE VITAMINS-MINERALS (THERAPEUTIC MULTIVITAMIN-MINERALS) TABLET    Take 1 tablet by mouth daily    NALOXONE (NARCAN) 4 MG/0.1ML LIQD NASAL SPRAY    1 spray by Nasal route as needed for Opioid Reversal    OLOPATADINE (PATANOL) 0.1 % OPHTHALMIC SOLUTION    Place 1 drop into both eyes daily as needed for Allergies    PANTOPRAZOLE (PROTONIX) 40 MG TABLET    Take 1 tablet by mouth every morning (before breakfast)    SUCRALFATE (CARAFATE) 1 GM TABLET    Take 1 tablet by mouth in the morning and 1 tablet in the evening.    TADALAFIL (CIALIS) 20 MG TABLET    Take 1 tablet by mouth as needed for Erectile Dysfunction    TRAZODONE (DESYREL) 100 MG TABLET    Take 1.5 tablets by mouth nightly       ALLERGIES     Bee pollen, Grass pollen(k-[id number]-t-swt vern), Pollen extract, Bromfenac, Nsaids, and Oxycodone    FAMILY HISTORY       Family History   Problem Relation Age of Onset    Diabetes Mother         SOCIAL HISTORY       Social History     Socioeconomic History    Marital status: Married    Number of children: 6   Tobacco Use    Smoking status: Former     Packs/day: 1.00     Years: 15.00     Pack years: 15.00     Types: Cigarettes     Start date: 02/16/1991     Quit date: 12/02/2020     Years since quitting: 0.8    Smokeless tobacco: Never   Substance and Sexual Activity    Alcohol use: Yes     Alcohol/week: 4.0 standard drinks     Types: 4 Cans of beer per week     Comment: 15 DRINKS PER WEEK    Drug use: Yes     Frequency: 1.0 times per week     Types: Marijuana Sherrie Mustache)    Sexual activity: Yes     Partners: Female   Social History Narrative    ** Merged History Encounter **            OBJECTIVE   Well developed, well nourished male patient sitting comfortably in his chair. Awake, alert, and oriented. Answers questions appropriately, no acute distress. Normal affect.  Pupils are equal and round bilaterally, anicteric sclera. Mucous membranes moist without ulceration. Respirations are unlabored, no distal cyanosis or clubbing. Palpable dorsalis pedis pulse. Hypersensitivity lateral ankle and hindfoot radiating to dorsal midfoot otherwise neurovascularly intact, fires all motor groups 5/5. No evidence of open wound, skin breakdown, or sign of infection. Skin is intact. Capillary refill less than 2 seconds.  Patient presents partial weightbearing on the left ankle with use of crutches for assistance.    Examination of the left ankle shows incisions well-healed.  No erythema or sign of infection, minimal swelling.      Tenderness over the medial gutter and anterior ankle joint.  Tenderness over the lateral gutter and peroneal tendons within the fibular groove.      No obvious subluxation or dislocation of peroneal tendons.  Guarding on examination and difficulty to perform resisted circumduction of the ankle secondary to pain.      Stiffness and increased pain with  gentle passive range of motion, no change in range of motion compared to previous examination.  Ankle stable to anterior drawer sign, patient guarding on examination.      Hypersensitivity lateral ankle, hindfoot and dorsal midfoot in distribution of superficial peroneal nerve, positive Tinel's sign superficial peroneal nerve over the lateral lower extremity proximal to the ankle joint.  Otherwise sensation intact, palpable dorsalis pedis pulse.      MRI left ankle:  MRI left ankle without contrast including T1 and T2 weighted images performed at [facility] on September 13, 2021 and independently reviewed with the patient at today's visit shows:    Indication: Pain., 09/13/21 osteochondral lesion. Possible peroneal tendon tear     Technical: Axial, sagittal, coronal T1 and fat saturated T2 images of the LEFT   ankle. No contrast.     Comparison: Radiographs 08/24/2021.     Findings: There is no osseous malalignment. There is bony  irregularity at the   lateral talar articular surface, minimally edematous, measuring 12 x 6 mm. Mild   periarticular demineralization is present throughout the hindfoot. There is   tibiotalar and posterior subtalar joint space narrowing. Retrocalcaneal bursitis   is present. There is no retro-Achilles bursitis.     Achilles, posterior tibial, flexor, anterior tibial, extensor and peroneal   tendons are intact.     There is no well-defined anterior talofibular ligament. I believe I see portions   of the calcaneofibular ligament. The posterior talofibular ligament appears   attenuated, thin. Deep deltoid fibers are intact. Spring ligament is intact.     There is mild muscle atrophy in the intrinsic musculature of the foot. Plantar   fascia is intact.     IMPRESSION:  Bony irregularity along the articular surface of the lateral talar dome,   minimally edematous, consistent with 12 x 6 mm osteochondral lesion. No   fragmentation stability.     Postsurgical changes in the lateral ankle. No substantial anterior talofibular   ligament fibers. Possible partial visualization of the calcaneofibular ligament,   incompletely seen. Attenuated and thin posterior talofibular ligament.     No peroneal tendon abnormality.     Muscle atrophy intrinsic musculature foot      ASSESSMENT      1. Neuritis    2. Arthritis of left ankle    3. Ankle instability, left    4. Orthopedic aftercare    5. Nicotine dependence, uncomplicated, unspecified nicotine product type    6. Abnormal MRI      PLAN   Presents for follow-up for his left ankle.  MRI of the left ankle without contrast including T1 and T2 weighted images independently reviewed with the patient at time of today's visit.  MRI shows no evidence of peroneal tendon tear.  Postsurgical changes of the lateral ankle and 12 x 6 mm osteochondral defect of the lateral talar dome.  Results reviewed independently with the patient at time of today's visit.  Nature of pathology discussed in  detail.    Patient has undergone multiple left ankle procedures in the past including arthroscopy procedure with transfer of osteochondral autograft from the ipsilateral knee in May 2021 and left ankle arthroscopy with microfracture of OCD lesion and lateral ligament reconstruction in March 2023.      Patient continues to have pain in the left ankle, increased pain with weightbearing activity.  Patient is still unable to put full weight on the left ankle following his surgery 4 1/2 months ago.    Patient unfortunately  continues to have hypersensitivity along the lateral aspect of his ankle and leg along the SPN distribution.  He has no desire to undergo any type of surgical type intervention.  He has previously discussed with Anda Kraft the possibility of referral to a pain specialist for nerve block versus neurectomy versus ablation type procedures.  He is amenable to this today.  We will provide him with a referral.  I have discussed that he should call us back after he has met with them and come up with a plan with them.

## 2021-10-16 ENCOUNTER — Encounter

## 2021-11-02 NOTE — Telephone Encounter (Signed)
Ronald Herrera WANTS TO MAKE SURE THE FORM REGARDING SEDENTARY FUNCTION WAS RECEIVED, STATES WAS FAXED ON 10/28/21.      CLAIM NUM: [id number]

## 2021-11-04 NOTE — Telephone Encounter (Signed)
HAVE NOT RECEIVED AT THIS TIME. WE ARE IN A DIFFERENT OFFICE EVERYDAY, THEREFORE IT MAY TAKE A WEEK TO RECEIVE AND RETURN FAXES.

## 2021-11-04 NOTE — Telephone Encounter (Signed)
Jewett [id number]    CALLING TO FOLLOW UP TO SEE IF FORM WAS RECEIVED THAT WAS SENT TO FAX [phone] ON AUG. 31, AND 9/13 TO FAX [phone].     PLEASE CALL TO CONFIRM. THANK YOU.

## 2021-11-06 NOTE — Telephone Encounter (Signed)
FAX NUMBER IS INCORRECT. LVM WITH MARIA WITH MY DIRECT FAX [phone].
